# Patient Record
Sex: Female | Born: 1945
Health system: Southern US, Community
[De-identification: ages and names within clinical notes are randomized; demographics above are authoritative.]

## PROBLEM LIST (undated history)

## (undated) DIAGNOSIS — K219 Gastro-esophageal reflux disease without esophagitis: Secondary | ICD-10-CM

## (undated) DIAGNOSIS — T7840XA Allergy, unspecified, initial encounter: Secondary | ICD-10-CM

## (undated) DIAGNOSIS — K589 Irritable bowel syndrome without diarrhea: Secondary | ICD-10-CM

## (undated) DIAGNOSIS — N289 Disorder of kidney and ureter, unspecified: Secondary | ICD-10-CM

## (undated) DIAGNOSIS — F32A Depression, unspecified: Secondary | ICD-10-CM

## (undated) DIAGNOSIS — I10 Essential (primary) hypertension: Secondary | ICD-10-CM

## (undated) DIAGNOSIS — R51 Headache: Secondary | ICD-10-CM

## (undated) DIAGNOSIS — M797 Fibromyalgia: Secondary | ICD-10-CM

## (undated) DIAGNOSIS — C801 Malignant (primary) neoplasm, unspecified: Secondary | ICD-10-CM

## (undated) DIAGNOSIS — F419 Anxiety disorder, unspecified: Secondary | ICD-10-CM

## (undated) DIAGNOSIS — F329 Major depressive disorder, single episode, unspecified: Secondary | ICD-10-CM

## (undated) DIAGNOSIS — H269 Unspecified cataract: Secondary | ICD-10-CM

## (undated) DIAGNOSIS — M1990 Unspecified osteoarthritis, unspecified site: Secondary | ICD-10-CM

## (undated) DIAGNOSIS — M810 Age-related osteoporosis without current pathological fracture: Secondary | ICD-10-CM

## (undated) DIAGNOSIS — E559 Vitamin D deficiency, unspecified: Secondary | ICD-10-CM

## (undated) DIAGNOSIS — E785 Hyperlipidemia, unspecified: Secondary | ICD-10-CM

## (undated) DIAGNOSIS — K621 Rectal polyp: Secondary | ICD-10-CM

## (undated) DIAGNOSIS — K635 Polyp of colon: Secondary | ICD-10-CM

## (undated) DIAGNOSIS — G4730 Sleep apnea, unspecified: Secondary | ICD-10-CM

## (undated) DIAGNOSIS — R0602 Shortness of breath: Secondary | ICD-10-CM

## (undated) HISTORY — DX: Irritable bowel syndrome, unspecified: K58.9

## (undated) HISTORY — DX: Age-related osteoporosis without current pathological fracture: M81.0

## (undated) HISTORY — DX: Allergy, unspecified, initial encounter: T78.40XA

## (undated) HISTORY — PX: OTHER SURGICAL HISTORY: SHX169

## (undated) HISTORY — DX: Hyperlipidemia, unspecified: E78.5

## (undated) HISTORY — DX: Unspecified cataract: H26.9

## (undated) HISTORY — DX: Malignant (primary) neoplasm, unspecified: C80.1

## (undated) HISTORY — DX: Rectal polyp: K62.1

## (undated) HISTORY — DX: Unspecified osteoarthritis, unspecified site: M19.90

## (undated) HISTORY — DX: Polyp of colon: K63.5

## (undated) HISTORY — DX: Vitamin D deficiency, unspecified: E55.9

## (undated) HISTORY — DX: Gastro-esophageal reflux disease without esophagitis: K21.9

## (undated) HISTORY — PX: ABDOMINAL HYSTERECTOMY: SHX81

## (undated) HISTORY — PX: TONSILLECTOMY: SUR1361

## (undated) HISTORY — DX: Sleep apnea, unspecified: G47.30

## (undated) HISTORY — PX: APPENDECTOMY: SHX54

## (undated) HISTORY — PX: BREAST LUMPECTOMY: SHX2

## (undated) SURGERY — Surgical Case
Anesthesia: *Unknown

---

## 2000-10-12 ENCOUNTER — Emergency Department (HOSPITAL_COMMUNITY): Admission: EM | Admit: 2000-10-12 | Discharge: 2000-10-12 | Payer: Self-pay | Admitting: Emergency Medicine

## 2001-05-27 ENCOUNTER — Encounter: Payer: Self-pay | Admitting: Internal Medicine

## 2001-05-27 ENCOUNTER — Ambulatory Visit (HOSPITAL_COMMUNITY): Admission: RE | Admit: 2001-05-27 | Discharge: 2001-05-27 | Payer: Self-pay | Admitting: Internal Medicine

## 2002-10-06 ENCOUNTER — Encounter: Payer: Self-pay | Admitting: Internal Medicine

## 2002-10-06 ENCOUNTER — Ambulatory Visit (HOSPITAL_COMMUNITY): Admission: RE | Admit: 2002-10-06 | Discharge: 2002-10-06 | Payer: Self-pay | Admitting: Internal Medicine

## 2003-04-03 ENCOUNTER — Emergency Department (HOSPITAL_COMMUNITY): Admission: EM | Admit: 2003-04-03 | Discharge: 2003-04-03 | Payer: Self-pay | Admitting: Emergency Medicine

## 2003-09-22 ENCOUNTER — Ambulatory Visit (HOSPITAL_COMMUNITY): Admission: RE | Admit: 2003-09-22 | Discharge: 2003-09-22 | Payer: Self-pay | Admitting: Internal Medicine

## 2003-09-22 HISTORY — PX: COLONOSCOPY: SHX174

## 2004-03-10 ENCOUNTER — Ambulatory Visit (HOSPITAL_COMMUNITY): Admission: RE | Admit: 2004-03-10 | Discharge: 2004-03-10 | Payer: Self-pay | Admitting: Internal Medicine

## 2004-08-30 ENCOUNTER — Ambulatory Visit: Payer: Self-pay | Admitting: Orthopedic Surgery

## 2004-09-13 ENCOUNTER — Ambulatory Visit: Payer: Self-pay | Admitting: Orthopedic Surgery

## 2005-01-03 ENCOUNTER — Ambulatory Visit (HOSPITAL_COMMUNITY): Admission: RE | Admit: 2005-01-03 | Discharge: 2005-01-03 | Payer: Self-pay | Admitting: Internal Medicine

## 2005-01-09 ENCOUNTER — Ambulatory Visit (HOSPITAL_COMMUNITY): Admission: RE | Admit: 2005-01-09 | Discharge: 2005-01-09 | Payer: Self-pay | Admitting: Internal Medicine

## 2005-07-16 ENCOUNTER — Ambulatory Visit (HOSPITAL_COMMUNITY): Admission: RE | Admit: 2005-07-16 | Discharge: 2005-07-16 | Payer: Self-pay | Admitting: Internal Medicine

## 2005-12-03 ENCOUNTER — Ambulatory Visit (HOSPITAL_COMMUNITY): Admission: RE | Admit: 2005-12-03 | Discharge: 2005-12-03 | Payer: Self-pay | Admitting: Internal Medicine

## 2006-04-01 ENCOUNTER — Encounter
Admission: RE | Admit: 2006-04-01 | Discharge: 2006-06-30 | Payer: Self-pay | Admitting: Physical Medicine & Rehabilitation

## 2006-04-01 ENCOUNTER — Ambulatory Visit: Payer: Self-pay | Admitting: Physical Medicine & Rehabilitation

## 2006-05-28 ENCOUNTER — Ambulatory Visit: Payer: Self-pay | Admitting: Physical Medicine & Rehabilitation

## 2006-05-28 ENCOUNTER — Encounter
Admission: RE | Admit: 2006-05-28 | Discharge: 2006-08-26 | Payer: Self-pay | Admitting: Physical Medicine & Rehabilitation

## 2006-07-23 ENCOUNTER — Ambulatory Visit: Payer: Self-pay | Admitting: Physical Medicine & Rehabilitation

## 2006-10-08 ENCOUNTER — Ambulatory Visit (HOSPITAL_COMMUNITY): Admission: RE | Admit: 2006-10-08 | Discharge: 2006-10-08 | Payer: Self-pay | Admitting: Internal Medicine

## 2006-10-10 ENCOUNTER — Ambulatory Visit (HOSPITAL_COMMUNITY): Admission: RE | Admit: 2006-10-10 | Discharge: 2006-10-10 | Payer: Self-pay | Admitting: Internal Medicine

## 2006-11-12 ENCOUNTER — Ambulatory Visit: Payer: Self-pay | Admitting: Internal Medicine

## 2006-11-19 ENCOUNTER — Ambulatory Visit (HOSPITAL_COMMUNITY): Admission: RE | Admit: 2006-11-19 | Discharge: 2006-11-19 | Payer: Self-pay | Admitting: Internal Medicine

## 2006-11-19 ENCOUNTER — Ambulatory Visit: Payer: Self-pay | Admitting: Internal Medicine

## 2006-11-19 HISTORY — PX: ESOPHAGOGASTRODUODENOSCOPY: SHX1529

## 2006-12-10 ENCOUNTER — Ambulatory Visit (HOSPITAL_COMMUNITY): Admission: RE | Admit: 2006-12-10 | Discharge: 2006-12-10 | Payer: Self-pay | Admitting: Internal Medicine

## 2007-10-10 ENCOUNTER — Ambulatory Visit (HOSPITAL_COMMUNITY): Admission: RE | Admit: 2007-10-10 | Discharge: 2007-10-10 | Payer: Self-pay | Admitting: Internal Medicine

## 2008-12-03 ENCOUNTER — Ambulatory Visit (HOSPITAL_COMMUNITY): Admission: RE | Admit: 2008-12-03 | Discharge: 2008-12-03 | Payer: Self-pay | Admitting: Internal Medicine

## 2009-04-01 ENCOUNTER — Ambulatory Visit: Admission: RE | Admit: 2009-04-01 | Discharge: 2009-04-01 | Payer: Self-pay | Admitting: Internal Medicine

## 2009-08-17 ENCOUNTER — Ambulatory Visit (HOSPITAL_BASED_OUTPATIENT_CLINIC_OR_DEPARTMENT_OTHER): Admission: RE | Admit: 2009-08-17 | Discharge: 2009-08-17 | Payer: Self-pay | Admitting: Neurology

## 2009-11-21 ENCOUNTER — Ambulatory Visit (HOSPITAL_COMMUNITY): Admission: RE | Admit: 2009-11-21 | Discharge: 2009-11-21 | Payer: Self-pay | Admitting: Nephrology

## 2009-12-05 ENCOUNTER — Ambulatory Visit (HOSPITAL_COMMUNITY): Admission: RE | Admit: 2009-12-05 | Discharge: 2009-12-05 | Payer: Self-pay | Admitting: Internal Medicine

## 2010-09-19 NOTE — Consult Note (Signed)
NAME:  Tiffany Ortiz, Tiffany Ortiz            ACCOUNT NO.:  [id number]   MEDICAL RECORD NO.:  [id number]          PATIENT TYPE:  AMB   LOCATION:  DAY                           FACILITY:  [facility]   PHYSICIAN:  R. Roetta Sessions, M.D. DATE OF BIRTH:  02-23-1946   DATE OF CONSULTATION:  11/12/2006  DATE OF DISCHARGE:                                 CONSULTATION   REFERRING PHYSICIAN:  Kingsley Callander. Ouida Sills, MD   REASON FOR CONSULTATION:  Dysphagia.   HISTORY OF PRESENT ILLNESS:  Tiffany Ortiz is a 65 year old female with a  longstanding history of a intermittent dysphagia.  She tells me she has  problems with both solids and liquids.  She feels as though her food  gets stuck in her mid esophagus. She tries to clear as a bolus with  water. Occasionally she will have regurgitation of the water when she  tries to do this.  She does take Prilosec on an as-needed basis for  heartburn and indigestion.  She does have symptoms quite frequently, a  couple of times a week. She complains of anorexia, although her weight  has remained stable.  She denies any early satiety.  She usually has a  sore throat as well.  She takes occasional Goody powder and denies any  aspirin or NSAID use otherwise. She was actually evaluated in 2004 by  Tana Coast, PA-C, with similar symptoms. It was suggested she have an  EGD with possible esophageal dilatation at that time.  However, she  never followed up with this.  She has tried Protonix,  Aciphex, and  Prilosec p.r.n.  She felt Prilosec worked the best of these three  medications.  However, she is using it on just a p.r.n. basis.   PAST MEDICAL AND SURGICAL HISTORY:  1. Fibromyalgia.  2. Arthritis.  3. Hypertension.  4. Anxiety.  5. History of irritable bowel syndrome.  6. History of cervical cancer at age 70 status post partial      hysterectomy followed by complete  oophorectomy in 1990 due to      ovarian cyst.  7. She had has had a benign breast biopsy.   CURRENT  MEDICATIONS:  1. Calan SR 240 40 mg daily.  2. Glucosamine chondroitin once daily.  3. Benicar 20 mg daily.  4. Torsemide 20 mg p.r.n.  5. Cymbalta 60 mg daily,  6. Xanax 0.5 mg one to two daily p.r.n.  7. Provigil 200 mg daily.  8. Hydrocodone 5/500 mg  q.4 h p.r.n.  9. DHEA 50 mg daily nightly.  10.Omega-3 one to three capsules daily,  11.Calcium and vitamin D 600 mg daily.  12.Senokot p.r.n.   ALLERGIES:  1. SHE HAS A SEVERE SENSITIVITY TO METAL ALLOYS AND DESCRIBES POST      SURGICAL PROBLEMS WERE SHE HAS HAD EXPOSURE TO METAL.  She tells me      if she needed surgery, she would require sutures rather than      staples.  2. ERYTHROMYCIN.  3. PHENERGAN.  4. OXYCODONE.   FAMILY HISTORY:  There is no known family history colorectal carcinoma  or chronic GI  problems.   SOCIAL HISTORY:  Tiffany Ortiz is married.  She has two grown healthy  children.  She is on disability with history of fibromyalgia.  She  denies any tobacco, alcohol or drug use.   REVIEW OF SYSTEMS:  See HPI.  GI: She has chronic constipation which is  worsened by her narcotic use.  She is using stool softeners with good  relief at this time.  She denies any rectal bleeding or melena.   PHYSICAL EXAMINATION:  VITAL SIGNS:  Weight 167 pounds.  Height 63-1/2  inches.  Temperature 98.5, blood pressure 140/90, and pulse 88.  GENERAL:  Tiffany Ortiz is a well-developed, well-nourished elderly African-  American female in no acute distress.  HEENT:  Clear, nonicteric.  Conjunctivae pink.  Oropharynx pink and  moist without lesions.  NECK:  Supple without thyromegaly.  HEART:  Regular rate and rhythm, normal S1, S2 without murmurs, clicks,  rubs or gallops.  LUNGS:  Clear to auscultation bilaterally.  ABDOMEN:  Positive bowel sounds x4.  No bruits auscultated.  Soft,  nontender, nondistended, without palpable mass or splenomegaly.  No  rebound, tenderness or guarding.  EXTREMITIES:  Without clubbing or edema  bilaterally.  SKIN:  Warm and dry without any rash or jaundice.   IMPRESSION:  Tiffany Ortiz is a 65 year old female with greater than 4-year  history of intermittent dysphagia with both liquids and solids.  She  also describes frequent gastroesophageal reflux disease symptoms several  times a week. She has not been on a proton pump inhibitor routinely;  however, she has tried Protonix and Aciphex and p.r.n. Prilosec with  good relief of her symptoms.  She may benefit from long-term proton pump  inhibitor use.  I suspect she may have esophageal web, ring,  or  stricture, and she is going to need further evaluation at this time.   PLAN:  1. Possible EGD with Dr. Jena Gauss in the near future.  I have  discussed      the procedure including risks and benefits to include but not      limited to infection, perforation, drug reaction.  She agrees, and      consent will be obtained.  2. She is due for colonoscopy in 2015.  3. She can use Pepcid AC or Zantac on a p.r.n. basis prior to EGD.   I would like to thank Dr. Ouida Sills for allowing [place] to participate in the  care of  Tiffany Ortiz.      Lorenza Burton, N.P.      Jonathon Bellows, M.D.  Electronically Signed    KJ/MEDQ  D:  11/12/2006  T:  11/12/2006  Job:  [id number]   cc:   Kingsley Callander. Ouida Sills, MD  Fax: [phone]

## 2010-09-19 NOTE — Op Note (Signed)
NAME:  Tiffany Ortiz, Tiffany Ortiz            ACCOUNT NO.:  [id number]   MEDICAL RECORD NO.:  [id number]          PATIENT TYPE:  AMB   LOCATION:  DAY                           FACILITY:  [facility]   PHYSICIAN:  R. Roetta Sessions, M.D. DATE OF BIRTH:  February 06, 1946   DATE OF PROCEDURE:  11/19/2006  DATE OF DISCHARGE:                                PROCEDURE NOTE   PROCEDURE:  1. Esophagogastroduodenoscopy  2. Maloney dilation.   ENDOSCOPIST:  Jonathon Bellows, M.D.   INDICATIONS FOR PROCEDURE:  A 65 year old African American married  female with intermittent esophageal dysphagia to solids and sometimes  liquids.  EGD is now being done.  Potential for esophageal dilation is  reviewed.  Potential risks, benefits and alternatives have been  discussed and her questions were answered.  She is agreeable.  Please  see documentation in the medical record.   PROCEDURE NOTE:  O2 saturation, blood pressure, pulse and respirations  were monitor throughout the entire procedure.   CONSCIOUS SEDATION:  Versed 4 mg IV, Demerol 75 mg IV in divided doses.   INSTRUMENT:  Pentax video chip system.   FINDINGS:  Examination of the tubular esophagus revealed a prominent  Schatzki's ring with overlying distal esophageal erosions.  There was no  evidence of tumor.  The EG junction was traversed with the scope without  any difficulty.   STOMACH:  Gastric cavity was empty and insufflated well with air.  Thorough examination of the gastric mucosa including a retroflex view of  the proximal stomach and esophagogastric demonstrated only a small  hiatal hernia.  Pylorus was patent and easily traversed.  Examination of  the bulb and second portion revealed no abnormalities.  Therapeutic/diagnostic maneuvers were performed.  Scope was withdrawn  and a 56-French Maloney dilator was passed to full insertion.  A look  back revealed the ring had been ruptured as well as a 3-cm superficial  tear in the distal esophagus just above  the ring.  The patient tolerated  the procedure well and was reactive after endoscopy.   IMPRESSION:  1. Prominent Schatzki's ring with overlying distal esophageal erosions      consistent with erosive reflux esophagitis, status post dilation      and disruption of ring as described above.  2. Small hiatal hernia.  3. Otherwise normal stomach, first duodenum and second duodenum.   RECOMMENDATIONS:  1. Clear liquid diet today, but advance diet as tolerated tomorrow.  2. Take Prilosec 20 mg orally every day.  3. Ms. Beam is to call if she has any future difficulties with      swallowing.      Jonathon Bellows, M.D.  Electronically Signed     RMR/MEDQ  D:  11/19/2006  T:  11/20/2006  Job:  [id number]   cc:   Kingsley Callander. Ouida Sills, MD  Fax: [phone]

## 2010-09-22 NOTE — Assessment & Plan Note (Signed)
DATE OF OFFICE VISIT:  Wednesday, July 24, 2006.   Tiffany Ortiz is back regarding her fibromyalgia.  She has done well on  Provigil but has come off the medication when she ran out and did not  call for refills.  She has been sticking with her exercise program and  occasionally has tenderness on the bad days, but in general has been  doing better with this.  She likes the DHEA and on 25 mg of b.i.d.  She  does notice some hair growth on her lips which she is managing with wax,  etc.  She has been happy enough with the DHEA that she tolerates the  hair issues.  She continues on Cymbalta 60 mg a day for mood.  She  complains of itching possibly related to her Ultram.  We tried some  Benadryl with plus/minus results. She complains of runny nose and sore  throat as well.  It may be allergy related.  She has had some lesions on  her gluteal area on the right.  Apparently they have been identified in  the past and she has used Valtrex.   REVIEW OF SYSTEMS:  Positive for spasm, depression, skin rash and  breakdown, respiratory symptoms, shortness of breath.  Full review is in  the written health and history section.  Other pertinent positives as  above.   SOCIAL HISTORY:  The patient is married and husband is having some  health issues of his own.   PHYSICAL EXAMINATION:  VITAL SIGNS:  Blood pressure 126/58, pulse 89,  respiratory rate 16.  Saturation 98% on room air.  GENERAL:  Patient is pleasant in no acute distress.  Alert and oriented  x3.  NEUROMUSCULAR:  Gait is normal.  She complains of some generalized  tenderness with bending.  It was less consistent, however, down the  right leg today.  She has hamstring tightness with bending.  There is  also some tautness in the lower lumbar paraspinous musculature and  anterior scapular musculature today.  I examined the right gluteal area  and there was herpetic-appearing sore that was healing over the right  medial gluteus.  No drainage or  open wound was appreciated.  It  generally was scabbed over.  Reflexes are 2+.  Sensation is normal on  both legs.  Cognitive issues generally intact with good insight,  awareness and memory.  HEART:  Regular rate.  CHEST:  Clear.  ABDOMEN:  Soft, nontender.  The patient remains obese.   ASSESSMENT:  1. Fibromyalgia syndrome.  2. Lumbar spondylosis.  3. Anxiety/depression.  4. Myofascial pain in the trunk, possibly the gluteal area and the      interscapular regions.  5. Herpetic sores in the gluteal area.   PLAN:  1. DHEA 25 mg twice a day.  2. Encourage use of Provigil 200 mg daily. She will start back on half      tablet and then increase to 200 mg after one week or so.  3. Maintain Cymbalta 60 mg daily.  4. Encourage exercise, range of motion and core muscle strengthening.      She would do well with a fibromyalgia based aquatic program.  5. Recommended Claritin for respiratory/allergy symptoms.  6. Ultram for breakthrough pain.  7. Valtrex 500 mg q.12h. for seven days for herpetic lesions.  8. Will see the patient back in about two to three months.      Ranelle Oyster, M.D.  Electronically Signed     ZTS/MedQ  D:  07/24/2006 12:57:01  T:  07/24/2006 15:26:10  Job #:  [id number]   cc:   Kingsley Callander. Ouida Sills, MD  Fax: [phone]

## 2010-09-22 NOTE — Assessment & Plan Note (Signed)
Tiffany Ortiz is back regarding her fibromyalgia.  She liked the Provigil  for energy.  She is using only 100 mg daily as the 200 mg is a bit too  strong.  She did not have her MRI done due to financial concerns.  She  states that she has significant tightness in the neck going down the  shoulders into the low back.  She has pain radiating into the right leg  particularly with symptoms of numbness, tingling into the foot.  The  pain is worse with activity, particularly walking, bending, sitting and  inactivity for long periods.  She also has pain with standing.  She  rates her pain as 7-9/10.  Sleep can be poor at times.  She tries to do  some walking.  She can go for about 30 minutes at a time with walking or  gentle exercise.   We had her labs performed on January 11.  She has a mild electrolytes  abnormalities.  Her triglycerides were high at 309, which apparently was  a decrease from a prior check.  Overall cholesterol was 200.  Magnesium,  B12, folate, progesterone, estrogen, testosterone were all within normal  range.  Her DHEA was low at 66.   REVIEW OF SYSTEMS:  The patient reports multiple issues including  numbness, tremor, tingling, spasms, depression, anxiety, night sweats,  diarrhea, constipation, abdominal pain.  She did stop her Celebrex due  to sweating and it seems to have helped.   SOCIAL HISTORY:  The patient is married, living with her husband who has  had some health issues of his own.   PHYSICAL EXAMINATION:  VITAL SIGNS:  Blood pressure 140/71, pulse 88,  respiratory rate 16, satting 97% on room air.  GENERAL:  The patient is pleasant.  No acute distress.  She is alert and  oriented x3.  Affect generally bright and appropriate. Gait is slightly  wide-based.  She continues to have some tenderness in the lumbar spine  from high to low.  The pain seemed to be worse with bending at the  waist.  She has some tenderness in the intrascapular area as well.  Muscle  strength was 5/5.  She had 2+ reflexes and intact sensation in  both legs.  Multiple areas of tenderness were appreciated over the legs,  arms, chest, etc.  Weight was generally stable but she is obese.  HEART:  Regular rate.  CHEST:  Clear.  ABDOMEN:  Soft, nontender.  COGNITIVELY:  She is intact.   ASSESSMENT:  1. Fibromyalgia syndrome.  2. Lumbar spondylosis with possible radiculopathy in the right.  3. Anxiety/depression.  4. Myofascial pain and truncal spasms due to poor posture and other      factors.   PLAN:  1. Begin trial Flexeril 5 mg q. 8 hours p.r.n.  2. Work on low back strengthening and exercises at home.  I would like      to see her increase her aerobic activity as well for both her      fibromyalgia symptoms and her triglycerides.  3. Maintain Provigil at 100 mg daily.  4. Begin trial DHEA 25 mg daily for 1 week and up to b.i.d.  5. Continue Cymbalta 60 mg daily.  6. I would like to see her get an MRI performed at some point for her      low back and leg symptoms.  7. Refilled Ultram 50 mg q. 8 hours p.r.n., #60.  8. I will see the patient back in  about 2 month's time.      Ranelle Oyster, M.D.  Electronically Signed     ZTS/MedQ  D:  05/29/2006 12:25:31  T:  05/29/2006 13:28:37  Job #:  [id number]   cc:   Kingsley Callander. Ouida Sills, MD  Fax: [phone]

## 2010-09-22 NOTE — Op Note (Signed)
NAME:  Tiffany Ortiz, Tiffany Ortiz                      ACCOUNT NO.:  [id number]   MEDICAL RECORD NO.:  [id number]                   PATIENT TYPE:  AMB   LOCATION:  DAY                                  FACILITY:  [facility]   PHYSICIAN:  R. Roetta Sessions, M.D.              DATE OF BIRTH:  [birth year]   DATE OF PROCEDURE:  09/22/2003  DATE OF DISCHARGE:                                 OPERATIVE REPORT   PROCEDURE:  Colonoscopy with biopsy.   INDICATIONS FOR PROCEDURE:  The patient is a 65 year old lady essentially  devoid of any lower GI tract symptoms, aside from a slight amount of blood  per rectum during the prep period yesterday.  Referred for screening  colonoscopy.  She reportedly has a history of hyperplastic polyps removed by  Dr. Katrinka Blazing during a colonoscopy years ago.  There is no family history of  colorectal neoplasia.  Colonoscopy is now being done.  This approach has been discussed with the patient at length.  The potential  risks, benefits, and alternatives have been reviewed and questions answered.  She is agreeable.  Please see my handwritten H&P for more information.   PROCEDURE:  O2 saturation, blood pressure, pulses, and respirations were  monitored throughout the entirety of the procedure.  Conscious sedation was  with Versed 3 mg IV, Demerol 75 mg IV in divided doses.  The instrument used  was the Olympus video chip system.   FINDINGS:  Digital rectal examination revealed no abnormalities.   ENDOSCOPIC FINDINGS:  The prep was adequate.   Rectum:  Examination of the rectal mucosa including retroflex view of the  anal verge revealed four 1 to 2-mm diminutive polyps.  Otherwise, the rectal  mucosa appeared normal.   Colon:  The colonic mucosa was surveyed from the rectosigmoid junction  through the left, transverse, right colon to the area of the appendiceal  orifice, ileocecal valve, and cecum.  These structures were well-seen and  photographed for the record.  From this  level, the scope was slowly  withdrawn.  All previously mentioned mucosal surfaces were again seen.  The  patient was noted to have pancolonic diverticula.  The remainder of the  colonic mucosa appeared normal.  The diminutive polyps in the rectum were  cold biopsy/removed.  The patient tolerated the procedure well and was  reactive in endoscopy.   IMPRESSION:  1. Diminutive rectal polyps cold biopsy/removed.  Otherwise normal rectal     mucosa.  2. Pancolonic diverticula.  The remainder of the colonic mucosa appeared     normal.   RECOMMENDATIONS:  1. Diverticulosis literature provided to [name].  2. Follow up on pathology.  3. Further recommendations to follow.      ___________________________________________  Jonathon Bellows, M.D.   RMR/MEDQ  D:  09/22/2003  T:  09/22/2003  Job:  [id number]   cc:   Kingsley Callander. Ouida Sills, M.D.  [address]  [place]  [place] [address]  Fax: [phone]

## 2010-09-22 NOTE — Assessment & Plan Note (Signed)
Tiffany Ortiz is back regarding her fibromyalgia syndrome.  She was unable  to tolerate the increase in Lyrica, and actually went back to her old  dose, which still is making her feel tired and sleepy, with superimposed  anxiety and increased appetite.  She had no problems changing to  Cymbalta, and thinks it may have been helpful somewhat for mood.  She  has not had her fibromyalgia labs performed yet.  She complains of pain  diffusely, but more over the legs, especially on the right from her hip  down into the foot.  She describes the pain as burning, tingling,  aching, constant.  Pain is, on average, a 7-8 out of 10.  Pain  interferes with general activity, relations with others, and enjoyment  of life on a moderate level.   REVIEW OF SYSTEMS:  Positive weakness, numbness, tingling, trouble  walking, spasms, confusion, depression, anxiety, skin rash in the past,  fever, chills, night sweats, diarrhea, constipation, limb swelling,  shortness of breath.   SOCIAL HISTORY:  Negative for any new changes.  She is happily married  to her husband.   PHYSICAL EXAMINATION:  Blood pressure is 151/84, pulse is 93,  respiratory rate is 16.  She is satting 98% on room air.  Patient is pleasant, in no acute distress.  She is alert and oriented  times 3.  Affect is bright and appropriate.  Weight is unchanged.  GAIT:  A slightly wide base, but generally stable.  HEART:  Regular rate and rhythm.  LUNGS:  Clear.  ABDOMEN:  Soft, nontender.  Both legs are somewhat sensitive to touch.  She had a positive seated  slump test and straight leg test in the right leg today.  Reflexes were  2+, however, in all 4 tested areas in lower extremities.  Sensory exam  was grossly intact.  She has significant hallux valgus deformities in  both feet, right more than left, with pressure areas at the MTP.  Multiple tender points are appreciated throughout the legs, chest, back,  neck, etc., today.  Cognitively, she was  generally appropriate with good insight and  awareness.  Cranial nerve exam is intact.   ASSESSMENT:  1. Fibromyalgia syndrome.  2. Lumbar spondylosis with questionable radiculopathy.  3. Anxiety and depression.   PLAN:  1. Stop Lyrica.  2. Begin a trial of Provigil 200 mg daily.  3. Cymbalta 60 mg daily for now.  Consider increasing this in the      future.  4. Patient needs to complete blood work.  5. Have an MRI performed of the lumbar spine and rule out      radiculopathy.  6. Begin trial of Ultram for breakthrough pain 50 mg q.8h. p.r.n.  7. Celebrex 200 mg daily for anti-inflammatory effects.  Samples were      given today.  8. I will see the patient back in about 1 month's time.      Ranelle Oyster, M.D.  Electronically Signed     ZTS/MedQ  D:  04/26/2006 10:42:15  T:  04/26/2006 19:19:37  Job #:  [id number]   cc:   Kingsley Callander. Ouida Sills, MD  Fax: [phone]

## 2011-01-30 ENCOUNTER — Other Ambulatory Visit: Payer: Self-pay

## 2011-01-30 ENCOUNTER — Emergency Department (HOSPITAL_COMMUNITY)
Admission: EM | Admit: 2011-01-30 | Discharge: 2011-01-30 | Disposition: A | Discharge status: Home / Self Care | Payer: Medicare Other | Attending: Emergency Medicine | Admitting: Emergency Medicine

## 2011-01-30 DIAGNOSIS — E876 Hypokalemia: Secondary | ICD-10-CM | POA: Insufficient documentation

## 2011-01-30 DIAGNOSIS — E119 Type 2 diabetes mellitus without complications: Secondary | ICD-10-CM | POA: Insufficient documentation

## 2011-01-30 DIAGNOSIS — I4949 Other premature depolarization: Secondary | ICD-10-CM | POA: Insufficient documentation

## 2011-01-30 DIAGNOSIS — R109 Unspecified abdominal pain: Secondary | ICD-10-CM | POA: Insufficient documentation

## 2011-01-30 DIAGNOSIS — I498 Other specified cardiac arrhythmias: Secondary | ICD-10-CM | POA: Insufficient documentation

## 2011-01-30 DIAGNOSIS — I10 Essential (primary) hypertension: Secondary | ICD-10-CM | POA: Insufficient documentation

## 2011-01-30 DIAGNOSIS — IMO0001 Reserved for inherently not codable concepts without codable children: Secondary | ICD-10-CM | POA: Insufficient documentation

## 2011-01-30 HISTORY — DX: Essential (primary) hypertension: I10

## 2011-01-30 HISTORY — DX: Fibromyalgia: M79.7

## 2011-01-30 HISTORY — DX: Disorder of kidney and ureter, unspecified: N28.9

## 2011-01-30 LAB — CBC
Hemoglobin: 14.3 g/dL (ref 12.0–15.0)
MCH: 30.6 pg (ref 26.0–34.0)
RBC: 4.67 MIL/uL (ref 3.87–5.11)

## 2011-01-30 LAB — BASIC METABOLIC PANEL
BUN: 29 mg/dL — ABNORMAL HIGH (ref 6–23)
GFR calc Af Amer: 57 mL/min — ABNORMAL LOW (ref >60)
GFR calc non Af Amer: 47 mL/min — ABNORMAL LOW (ref >60)
Glucose, Bld: 137 mg/dL — ABNORMAL HIGH (ref 70–99)
Potassium: 3.1 mEq/L — ABNORMAL LOW (ref 3.5–5.1)

## 2011-01-30 NOTE — ED Notes (Signed)
Pt sent from PCP after being called with abnormal lab work. Potassium was 7.2. Pt denies sob or cp.

## 2011-01-30 NOTE — ED Provider Notes (Signed)
History     CSN: [id number] Arrival date & time: No admission date for patient encounter.  Chief Complaint  Patient presents with  . Abnormal Lab    HPI  (Consider location/radiation/quality/duration/timing/severity/associated sxs/prior treatment)  The history is provided by the patient and a relative.  SENT FROM ENDOCRINOLOGIST OFFICE FOR REPORT OF POTASSIUM OF 7 FROM BLOOD DRAW. PATIENT WITHOUT SYMPTOMS. NO HX OF RENAL FAILURE NOT ON DIALYSIS. HX OF DIABETES.   Past Medical History  Diagnosis Date  . Fibromyalgia   . Diabetes mellitus   . Renal disorder   . Hypertension     Past Surgical History  Procedure Date  . Abdominal hysterectomy     No family history on file.  History  Substance Use Topics  . Smoking status: Never Smoker   . Smokeless tobacco: Not on file  . Alcohol Use: No    OB History    Grav Para Term Preterm Abortions TAB SAB Ect Mult Living                  Review of Systems  Review of Systems  Constitutional: Negative for fever and fatigue.  HENT: Negative for congestion and neck pain.   Eyes: Negative for visual disturbance.  Respiratory: Negative for cough, chest tightness and shortness of breath.   Cardiovascular: Negative for chest pain and palpitations.  Gastrointestinal: Positive for abdominal pain. Negative for nausea, vomiting and diarrhea.  Genitourinary: Negative for dysuria.  Musculoskeletal: Negative for back pain.  Skin: Negative for rash.  Neurological: Negative for weakness and headaches.  Psychiatric/Behavioral: Negative for confusion.    Allergies  Promethazine  Home Medications  No current outpatient prescriptions on file.  Physical Exam    BP 142/68  Pulse 84  Temp(Src) 99.1 F (37.3 C) (Oral)  Resp 20  Ht 5\' 4"  (1.626 m)  Wt 169 lb (76.658 kg)  BMI 29.01 kg/m2  SpO2 98%  Physical Exam  Nursing note and vitals reviewed. Constitutional: She is oriented to person, place, and time. She appears  well-developed and well-nourished. No distress.  HENT:  Head: Normocephalic and atraumatic.  Mouth/Throat: Oropharynx is clear and moist.  Eyes: Conjunctivae and EOM are normal. Pupils are equal, round, and reactive to light.  Neck: Normal range of motion. Neck supple.  Cardiovascular: Normal rate, regular rhythm and normal heart sounds.   No murmur heard. Pulmonary/Chest: Effort normal and breath sounds normal.  Abdominal: Soft. Bowel sounds are normal. There is no tenderness.  Musculoskeletal: Normal range of motion.  Neurological: She is alert and oriented to person, place, and time. No cranial nerve deficit. She exhibits normal muscle tone. Coordination normal.  Skin: Skin is warm. No rash noted.    ED Course  Procedures (including critical care time)  Labs Reviewed - No data to display   Date: 01/30/2011  Rate: 80  Rhythm: normal sinus rhythm, sinus arrhythmia and premature ventricular contractions (PVC)  QRS Axis: normal  Intervals: normal  ST/T Wave abnormalities: normal and nonspecific T wave changes  Conduction Disutrbances:none  Narrative Interpretation:   Old EKG Reviewed: none available NOT CW HYPERKALEMIA    LABS IN ED ACUTALLY DEPICT MILD HYPOKALEMIA NOT HYPERKALEMIA.   MDM MOST LIKELY LAB FROM DOCTORS OFFICE HEMOLYZIED.   FOLLOW UP AS NEEDED.         Shelda Jakes, MD 01/30/11 [phone]

## 2011-06-06 DIAGNOSIS — E785 Hyperlipidemia, unspecified: Secondary | ICD-10-CM | POA: Diagnosis not present

## 2011-06-06 DIAGNOSIS — I10 Essential (primary) hypertension: Secondary | ICD-10-CM | POA: Diagnosis not present

## 2011-06-06 DIAGNOSIS — E119 Type 2 diabetes mellitus without complications: Secondary | ICD-10-CM | POA: Diagnosis not present

## 2011-06-06 DIAGNOSIS — R5383 Other fatigue: Secondary | ICD-10-CM | POA: Diagnosis not present

## 2011-07-25 DIAGNOSIS — J069 Acute upper respiratory infection, unspecified: Secondary | ICD-10-CM | POA: Diagnosis not present

## 2011-07-25 DIAGNOSIS — J392 Other diseases of pharynx: Secondary | ICD-10-CM | POA: Diagnosis not present

## 2011-07-25 DIAGNOSIS — J210 Acute bronchiolitis due to respiratory syncytial virus: Secondary | ICD-10-CM | POA: Diagnosis not present

## 2011-07-26 ENCOUNTER — Ambulatory Visit: Payer: Self-pay | Admitting: Obstetrics and Gynecology

## 2011-08-09 DIAGNOSIS — E119 Type 2 diabetes mellitus without complications: Secondary | ICD-10-CM | POA: Diagnosis not present

## 2011-08-09 DIAGNOSIS — E559 Vitamin D deficiency, unspecified: Secondary | ICD-10-CM | POA: Diagnosis not present

## 2011-08-09 DIAGNOSIS — I10 Essential (primary) hypertension: Secondary | ICD-10-CM | POA: Diagnosis not present

## 2011-08-09 DIAGNOSIS — D72829 Elevated white blood cell count, unspecified: Secondary | ICD-10-CM | POA: Diagnosis not present

## 2011-08-20 ENCOUNTER — Telehealth: Payer: Self-pay

## 2011-08-20 NOTE — Telephone Encounter (Signed)
Late entry for 08/18/2011. I called [facility] to refill pt's Zovirax ointment x1. 5 % 30 gm tube sig: Apply to affected area 6 x daily for seven days, as directed. Per protocol. JO CMA.

## 2011-09-05 DIAGNOSIS — E1142 Type 2 diabetes mellitus with diabetic polyneuropathy: Secondary | ICD-10-CM | POA: Diagnosis not present

## 2011-09-05 DIAGNOSIS — I10 Essential (primary) hypertension: Secondary | ICD-10-CM | POA: Diagnosis not present

## 2011-09-05 DIAGNOSIS — G47419 Narcolepsy without cataplexy: Secondary | ICD-10-CM | POA: Diagnosis not present

## 2011-09-05 DIAGNOSIS — IMO0001 Reserved for inherently not codable concepts without codable children: Secondary | ICD-10-CM | POA: Diagnosis not present

## 2011-10-16 DIAGNOSIS — E119 Type 2 diabetes mellitus without complications: Secondary | ICD-10-CM | POA: Diagnosis not present

## 2011-10-16 DIAGNOSIS — I10 Essential (primary) hypertension: Secondary | ICD-10-CM | POA: Diagnosis not present

## 2011-11-01 ENCOUNTER — Other Ambulatory Visit (HOSPITAL_COMMUNITY): Payer: Self-pay | Admitting: Internal Medicine

## 2011-11-01 DIAGNOSIS — Z139 Encounter for screening, unspecified: Secondary | ICD-10-CM

## 2011-11-05 ENCOUNTER — Ambulatory Visit (HOSPITAL_COMMUNITY)
Admission: RE | Admit: 2011-11-05 | Discharge: 2011-11-05 | Disposition: A | Discharge status: Home / Self Care | Payer: Medicare Other | Source: Ambulatory Visit | Attending: Internal Medicine | Admitting: Internal Medicine

## 2011-11-05 DIAGNOSIS — Z1231 Encounter for screening mammogram for malignant neoplasm of breast: Secondary | ICD-10-CM | POA: Diagnosis not present

## 2011-11-05 DIAGNOSIS — Z139 Encounter for screening, unspecified: Secondary | ICD-10-CM

## 2011-12-12 DIAGNOSIS — E669 Obesity, unspecified: Secondary | ICD-10-CM | POA: Diagnosis not present

## 2011-12-12 DIAGNOSIS — I10 Essential (primary) hypertension: Secondary | ICD-10-CM | POA: Diagnosis not present

## 2012-02-05 DIAGNOSIS — Z23 Encounter for immunization: Secondary | ICD-10-CM | POA: Diagnosis not present

## 2012-03-04 DIAGNOSIS — IMO0001 Reserved for inherently not codable concepts without codable children: Secondary | ICD-10-CM | POA: Diagnosis not present

## 2012-03-04 DIAGNOSIS — E119 Type 2 diabetes mellitus without complications: Secondary | ICD-10-CM | POA: Diagnosis not present

## 2012-03-04 DIAGNOSIS — E785 Hyperlipidemia, unspecified: Secondary | ICD-10-CM | POA: Diagnosis not present

## 2012-03-04 DIAGNOSIS — I10 Essential (primary) hypertension: Secondary | ICD-10-CM | POA: Diagnosis not present

## 2012-03-04 DIAGNOSIS — K219 Gastro-esophageal reflux disease without esophagitis: Secondary | ICD-10-CM | POA: Diagnosis not present

## 2012-08-18 DIAGNOSIS — E559 Vitamin D deficiency, unspecified: Secondary | ICD-10-CM | POA: Diagnosis not present

## 2012-08-18 DIAGNOSIS — N189 Chronic kidney disease, unspecified: Secondary | ICD-10-CM | POA: Diagnosis not present

## 2012-08-18 DIAGNOSIS — D649 Anemia, unspecified: Secondary | ICD-10-CM | POA: Diagnosis not present

## 2012-08-18 DIAGNOSIS — Z79899 Other long term (current) drug therapy: Secondary | ICD-10-CM | POA: Diagnosis not present

## 2012-08-18 DIAGNOSIS — I10 Essential (primary) hypertension: Secondary | ICD-10-CM | POA: Diagnosis not present

## 2012-08-18 DIAGNOSIS — R809 Proteinuria, unspecified: Secondary | ICD-10-CM | POA: Diagnosis not present

## 2012-08-25 DIAGNOSIS — M5430 Sciatica, unspecified side: Secondary | ICD-10-CM | POA: Diagnosis not present

## 2012-08-25 DIAGNOSIS — R109 Unspecified abdominal pain: Secondary | ICD-10-CM | POA: Diagnosis not present

## 2012-08-25 DIAGNOSIS — E119 Type 2 diabetes mellitus without complications: Secondary | ICD-10-CM | POA: Diagnosis not present

## 2012-08-29 DIAGNOSIS — D72829 Elevated white blood cell count, unspecified: Secondary | ICD-10-CM | POA: Diagnosis not present

## 2012-08-29 DIAGNOSIS — E119 Type 2 diabetes mellitus without complications: Secondary | ICD-10-CM | POA: Diagnosis not present

## 2012-08-29 DIAGNOSIS — E782 Mixed hyperlipidemia: Secondary | ICD-10-CM | POA: Diagnosis not present

## 2012-08-29 DIAGNOSIS — I10 Essential (primary) hypertension: Secondary | ICD-10-CM | POA: Diagnosis not present

## 2012-09-03 DIAGNOSIS — I10 Essential (primary) hypertension: Secondary | ICD-10-CM | POA: Diagnosis not present

## 2012-09-03 DIAGNOSIS — N182 Chronic kidney disease, stage 2 (mild): Secondary | ICD-10-CM | POA: Diagnosis not present

## 2012-09-03 DIAGNOSIS — E559 Vitamin D deficiency, unspecified: Secondary | ICD-10-CM | POA: Diagnosis not present

## 2012-09-23 DIAGNOSIS — IMO0001 Reserved for inherently not codable concepts without codable children: Secondary | ICD-10-CM | POA: Diagnosis not present

## 2012-09-23 DIAGNOSIS — R109 Unspecified abdominal pain: Secondary | ICD-10-CM | POA: Diagnosis not present

## 2012-09-23 DIAGNOSIS — F988 Other specified behavioral and emotional disorders with onset usually occurring in childhood and adolescence: Secondary | ICD-10-CM | POA: Diagnosis not present

## 2012-09-24 ENCOUNTER — Other Ambulatory Visit (HOSPITAL_COMMUNITY): Payer: Self-pay | Admitting: Internal Medicine

## 2012-09-24 DIAGNOSIS — R109 Unspecified abdominal pain: Secondary | ICD-10-CM

## 2012-09-25 ENCOUNTER — Other Ambulatory Visit: Payer: Self-pay

## 2012-09-25 DIAGNOSIS — G479 Sleep disorder, unspecified: Secondary | ICD-10-CM

## 2012-09-26 ENCOUNTER — Ambulatory Visit (HOSPITAL_COMMUNITY): Payer: Medicare Other

## 2012-09-30 ENCOUNTER — Ambulatory Visit (HOSPITAL_COMMUNITY)
Admission: RE | Admit: 2012-09-30 | Discharge: 2012-09-30 | Disposition: A | Discharge status: Home / Self Care | Payer: Medicare Other | Source: Ambulatory Visit | Attending: Internal Medicine | Admitting: Internal Medicine

## 2012-09-30 DIAGNOSIS — R197 Diarrhea, unspecified: Secondary | ICD-10-CM | POA: Diagnosis not present

## 2012-09-30 DIAGNOSIS — K5730 Diverticulosis of large intestine without perforation or abscess without bleeding: Secondary | ICD-10-CM | POA: Insufficient documentation

## 2012-09-30 DIAGNOSIS — K7689 Other specified diseases of liver: Secondary | ICD-10-CM | POA: Diagnosis not present

## 2012-09-30 DIAGNOSIS — R109 Unspecified abdominal pain: Secondary | ICD-10-CM | POA: Diagnosis not present

## 2012-09-30 DIAGNOSIS — R110 Nausea: Secondary | ICD-10-CM | POA: Insufficient documentation

## 2012-09-30 MED ORDER — IOHEXOL 300 MG/ML  SOLN
100.0000 mL | Freq: Once | INTRAMUSCULAR | Status: AC | PRN
  Administered 2012-09-30: 100 mL via INTRAVENOUS

## 2012-10-07 ENCOUNTER — Ambulatory Visit: Payer: Medicare Other | Attending: Internal Medicine | Admitting: Sleep Medicine

## 2012-10-07 DIAGNOSIS — Z6830 Body mass index (BMI) 30.0-30.9, adult: Secondary | ICD-10-CM | POA: Insufficient documentation

## 2012-10-07 DIAGNOSIS — G479 Sleep disorder, unspecified: Secondary | ICD-10-CM

## 2012-10-07 DIAGNOSIS — G4733 Obstructive sleep apnea (adult) (pediatric): Secondary | ICD-10-CM | POA: Diagnosis not present

## 2012-10-07 DIAGNOSIS — G4710 Hypersomnia, unspecified: Secondary | ICD-10-CM | POA: Diagnosis not present

## 2012-10-08 DIAGNOSIS — B029 Zoster without complications: Secondary | ICD-10-CM | POA: Diagnosis not present

## 2012-10-08 NOTE — Procedures (Signed)
[facility] Phelan Goers A. Gerilyn Pilgrim, MD     [email or web address]        NAMEFELIZA, Tiffany Ortiz            ACCOUNT NO.:  [id number]  MEDICAL RECORD NO.:  [id number]          PATIENT TYPE:  OUT  LOCATION:  SLEEP LAB                     FACILITY:  [facility]  PHYSICIAN:  Itati Brocksmith A. Gerilyn Pilgrim, M.D. DATE OF BIRTH:  1945-06-03  DATE OF STUDY:  10/07/2012                           NOCTURNAL POLYSOMNOGRAM  REFERRING PHYSICIAN:  Catalina Pizza, M.D.  INDICATIONS:  A 67 year old lady who presents with hypersomnia, fatigue, and snoring.  There is also history of witnessed apnea.  MEDICATIONS:  Folic acid, albuterol, Allegra, Nuvigil, Ultram, Valtrex, Pravachol, Zyrtec, losartan, gabapentin, Nexium, loperamide, vitamin D.  EPWORTH SLEEPINESS SCALE: 1.  BMI 30.  ARCHITECTURAL SUMMARY:  The total recording time is 412 minutes.  Sleep efficiency 79%.  Sleep latency 5 minutes.  REM latency 120 minutes. Stage N1 14%, N2 74%, N3 1%, and REM sleep 12%.  RESPIRATORY DATA:  Baseline oxygen saturation is 94, lowest saturation 82 during non-REM sleep.  Diagnostic AHI is 19 and RDI 20.  The events occurred more during REM sleep with the REM AHI being 40.  LIMB MOVEMENT SUMMARY:  PLM index 0.  ELECTROCARDIOGRAM SUMMARY:  Average heart rate is 70 with no significant dysrhythmias observed.  IMPRESSION:  Moderate obstructive sleep apnea syndrome.  RECOMMENDATION:  Formal CPAP titration recording.  Thanks for this referral.    Cheryle Dark A. Gerilyn Pilgrim, M.D.    KAD/MEDQ  D:  10/08/2012 09:06:05  T:  10/08/2012 09:24:10  Job:  [id number]

## 2012-10-29 DIAGNOSIS — B029 Zoster without complications: Secondary | ICD-10-CM | POA: Diagnosis not present

## 2012-10-29 DIAGNOSIS — F410 Panic disorder [episodic paroxysmal anxiety] without agoraphobia: Secondary | ICD-10-CM | POA: Diagnosis not present

## 2012-11-26 DIAGNOSIS — H02039 Senile entropion of unspecified eye, unspecified eyelid: Secondary | ICD-10-CM | POA: Diagnosis not present

## 2012-11-26 DIAGNOSIS — H2510 Age-related nuclear cataract, unspecified eye: Secondary | ICD-10-CM | POA: Diagnosis not present

## 2012-11-26 DIAGNOSIS — H04129 Dry eye syndrome of unspecified lacrimal gland: Secondary | ICD-10-CM | POA: Diagnosis not present

## 2012-11-26 DIAGNOSIS — E119 Type 2 diabetes mellitus without complications: Secondary | ICD-10-CM | POA: Diagnosis not present

## 2012-11-26 DIAGNOSIS — H02839 Dermatochalasis of unspecified eye, unspecified eyelid: Secondary | ICD-10-CM | POA: Diagnosis not present

## 2012-12-01 DIAGNOSIS — E119 Type 2 diabetes mellitus without complications: Secondary | ICD-10-CM | POA: Diagnosis not present

## 2012-12-01 DIAGNOSIS — E785 Hyperlipidemia, unspecified: Secondary | ICD-10-CM | POA: Diagnosis not present

## 2012-12-01 DIAGNOSIS — I10 Essential (primary) hypertension: Secondary | ICD-10-CM | POA: Diagnosis not present

## 2012-12-08 DIAGNOSIS — E119 Type 2 diabetes mellitus without complications: Secondary | ICD-10-CM | POA: Diagnosis not present

## 2012-12-08 DIAGNOSIS — I10 Essential (primary) hypertension: Secondary | ICD-10-CM | POA: Diagnosis not present

## 2012-12-08 DIAGNOSIS — E782 Mixed hyperlipidemia: Secondary | ICD-10-CM | POA: Diagnosis not present

## 2012-12-30 DIAGNOSIS — B029 Zoster without complications: Secondary | ICD-10-CM | POA: Diagnosis not present

## 2012-12-30 DIAGNOSIS — R197 Diarrhea, unspecified: Secondary | ICD-10-CM | POA: Diagnosis not present

## 2012-12-30 DIAGNOSIS — R109 Unspecified abdominal pain: Secondary | ICD-10-CM | POA: Diagnosis not present

## 2013-01-01 ENCOUNTER — Encounter: Payer: Self-pay | Admitting: Internal Medicine

## 2013-01-27 ENCOUNTER — Encounter: Payer: Self-pay | Admitting: Internal Medicine

## 2013-01-28 ENCOUNTER — Other Ambulatory Visit: Payer: Self-pay | Admitting: Internal Medicine

## 2013-01-28 ENCOUNTER — Ambulatory Visit (INDEPENDENT_AMBULATORY_CARE_PROVIDER_SITE_OTHER): Payer: Medicare Other | Admitting: Gastroenterology

## 2013-01-28 ENCOUNTER — Encounter: Payer: Self-pay | Admitting: Gastroenterology

## 2013-01-28 VITALS — BP 153/75 | HR 83 | Temp 97.9°F | Ht 61.0 in | Wt 171.2 lb

## 2013-01-28 DIAGNOSIS — K529 Noninfective gastroenteritis and colitis, unspecified: Secondary | ICD-10-CM

## 2013-01-28 DIAGNOSIS — R1314 Dysphagia, pharyngoesophageal phase: Secondary | ICD-10-CM

## 2013-01-28 DIAGNOSIS — R197 Diarrhea, unspecified: Secondary | ICD-10-CM | POA: Diagnosis not present

## 2013-01-28 DIAGNOSIS — K219 Gastro-esophageal reflux disease without esophagitis: Secondary | ICD-10-CM

## 2013-01-28 DIAGNOSIS — R1319 Other dysphagia: Secondary | ICD-10-CM

## 2013-01-28 DIAGNOSIS — R1310 Dysphagia, unspecified: Secondary | ICD-10-CM

## 2013-01-28 MED ORDER — PEG 3350-KCL-NA BICARB-NACL 420 G PO SOLR: 4000.0000 mL | ORAL | Status: DC

## 2013-01-28 MED ORDER — DICYCLOMINE HCL 10 MG PO CAPS: 10.0000 mg | ORAL_CAPSULE | Freq: Three times a day (TID) | ORAL | Status: DC

## 2013-01-28 NOTE — Progress Notes (Signed)
Primary Care Physician:  Catalina Pizza, MD  Primary Gastroenterologist:  Roetta Sessions, MD   Chief Complaint  Patient presents with  . Abdominal Pain  . Diarrhea    HPI:  Tiffany Ortiz is a 67 y.o. female here for further evaluation of abdominal pain and diarrhea.  Diagnosed with DM with neuropathy in 2011. Ever since then, change in bowel habits. Baseline IBS but never this bad. Certain foods cause her to have watery diarrhea. She does not tolerate dark greens or salad.  Tried to eliminate certain foods to see what is causing diarrhea. Bad episodes 1-2 times per month and may last for 1-2 weeks at a time. Difficult to describe how many stools she may have a day. No melena, brbpr. Will have intermittent solid stools. Occasional constipation. +lactose intolerance. Worse with milk and ice cream. Afraid to go anywhere. Urgency. Lots of bloating discomfort and flatulence. Intermittent incontinence. Heartburn for years. Dexilant started last year, insurance doesn't want to pay for it. Insurance wants her to take Nexium, but doesn't work. Used to take Imodium AD, but doesn't help. Lomotil doesn't help. Dysphagia to meats at times. Eats very slow/chews food thoroughly. EGD/ED helped a lot in 2008.  CT A/P with contrast on 09/30/12:  IMPRESSION:  1. No acute abnormality or finding to explain the patient's  symptoms.  2. Fatty infiltration of the liver.  3. Mild diverticulosis without diverticulitis.  4. Multilevel degenerative disc disease appearing worst at L4-5.   Current Outpatient Prescriptions  Medication Sig Dispense Refill  . ALPRAZolam (XANAX) 0.5 MG tablet Take 0.5 mg by mouth 2 (two) times daily as needed. For anxiety       . amphetamine-dextroamphetamine (ADDERALL) 10 MG tablet Take 10 mg by mouth daily.      . Armodafinil (NUVIGIL) 250 MG tablet Take 250 mg by mouth daily.        . chlorthalidone (HYGROTON) 25 MG tablet Take 25 mg by mouth daily.        Marland Kitchen dexlansoprazole  (DEXILANT) 60 MG capsule Take 60 mg by mouth daily.      Marland Kitchen gabapentin (NEURONTIN) 300 MG capsule Take 300 mg by mouth 3 (three) times daily.      Marland Kitchen glipiZIDE (GLUCOTROL XL) 5 MG 24 hr tablet Take 5 mg by mouth daily.      . Lactobacillus (PROBIOTIC ACIDOPHILUS PO) Take by mouth.      . linagliptin (TRADJENTA) 5 MG TABS tablet Take 5 mg by mouth daily.      Marland Kitchen losartan (COZAAR) 100 MG tablet Take 100 mg by mouth daily.        . Multiple Vitamins-Minerals (MULTIVITAMINS THER. W/MINERALS) TABS Take 1 tablet by mouth daily.        . Nutritional Supplements (DHEA PO) Take 1 tablet by mouth daily.        . valACYclovir (VALTREX) 500 MG tablet Take 500 mg by mouth 2 (two) times daily.      . verapamil (CALAN-SR) 240 MG CR tablet Take 240 mg by mouth daily.        . Vitamin D, Ergocalciferol, (DRISDOL) 50000 UNITS CAPS Take 50,000 Units by mouth every 7 (seven) days.        Marland Kitchen dicyclomine (BENTYL) 10 MG capsule Take 1 capsule (10 mg total) by mouth 4 (four) times daily -  before meals and at bedtime. As needed for diarrhea  90 capsule  0   No current facility-administered medications for this visit.    Allergies  as of 01/28/2013 - Review Complete 01/30/2011  Allergen Reaction Noted  . Erythromycin  01/28/2013  . Other  01/28/2013  . Oxycodone  01/28/2013  . Promethazine Nausea And Vomiting 01/30/2011    Past Medical History  Diagnosis Date  . Fibromyalgia   . Diabetes mellitus   . Renal disorder   . Hypertension   . IBS (irritable bowel syndrome)   . Vitamin D deficiency   . Sleep apnea     Past Surgical History  Procedure Laterality Date  . Abdominal hysterectomy      Cervical cancer, with incidental appendectomy  . Colonoscopy  09/22/2003    RMR: Diminutive rectal polyps cold biopsy/removed.  Otherwise normal rectal mucosa/Pancolonic diverticula.  The remainder of the colonic mucosa appeared normal  . Esophagogastroduodenoscopy  11/19/2006    [id number] Schatzki's ring with  overlying distal esophageal erosions consistent with erosive reflux esophagitis, status post dilation and disruption of ring as described above/ Small hiatal hernia/Otherwise normal stomach, first duodenum and second duodenum  . Bladder tack    . Breast lumpectomy      benign    Family History  Problem Relation Age of Onset  . Colon cancer Neg Hx   . Cancer Mother     ?ovarian    History   Social History  . Marital Status: Married    Spouse Name: N/A    Number of Children: N/A  . Years of Education: N/A   Occupational History  . Not on file.   Social History Main Topics  . Smoking status: Never Smoker   . Smokeless tobacco: Not on file  . Alcohol Use: No  . Drug Use: No  . Sexual Activity:    Other Topics Concern  . Not on file   Social History Narrative  . No narrative on file      ROS:  General: Negative for anorexia, weight loss, fever, chills, fatigue, weakness. Eyes: Negative for vision changes.  ENT: Negative for hoarseness, difficulty swallowing , nasal congestion. CV: Negative for chest pain, angina, palpitations, dyspnea on exertion, peripheral edema.  Respiratory: Negative for dyspnea at rest, dyspnea on exertion, cough, sputum, wheezing.  GI: See history of present illness. GU:  Negative for dysuria, hematuria, urinary incontinence, urinary frequency, nocturnal urination.  MS: Negative for joint pain, low back pain.  Derm: Negative for rash or itching.  Neuro: Negative for weakness, abnormal sensation, seizure, frequent headaches, memory loss, confusion.  Psych: Negative for anxiety, depression, suicidal ideation, hallucinations.  Endo: Negative for unusual weight change.  Heme: Negative for bruising or bleeding. Allergy: Negative for rash or hives.    Physical Examination:  BP 153/75  Pulse 83  Temp(Src) 97.9 F (36.6 C) (Oral)  Ht 5\' 1"  (1.549 m)  Wt 171 lb 3.2 oz (77.656 kg)  BMI 32.36 kg/m2   General: Well-nourished, well-developed in  no acute distress. Accompanied by daughter Head: Normocephalic, atraumatic.   Eyes: Conjunctiva pink, no icterus. Mouth: Oropharyngeal mucosa moist and pink , no lesions erythema or exudate. Neck: Supple without thyromegaly, masses, or lymphadenopathy.  Lungs: Clear to auscultation bilaterally.  Heart: Regular rate and rhythm, no murmurs rubs or gallops.  Abdomen: Bowel sounds are normal, nontender, nondistended, no hepatosplenomegaly or masses, no abdominal bruits or    hernia , no rebound or guarding.   Rectal: Deferred Extremities: No lower extremity edema. No clubbing or deformities.  Neuro: Alert and oriented x 4 , grossly normal neurologically.  Skin: Warm and dry, no rash or jaundice.  Psych: Alert and cooperative, normal mood and affect.  Labs: Labs from 12/01/2012. White blood cell count 7400, hemoglobin 13.2, hematocrit 38.5, MCV 86.3, platelets 368,000, sodium 141, potassium 4.1, glucose 151, BUN 18, creatinine 0.9, total bilirubin 0.3, alkaline phosphatase 107, AST 17, ALT 19, albumin 4, hemoglobin A1c 7.1.

## 2013-01-28 NOTE — Patient Instructions (Addendum)
1. Colonoscopy and upper endoscopy as scheduled. 2. Please have your blood work done as soon as possible. 3. Prescription for dicyclomine sent to your pharmacy to use as needed for diarrhea.

## 2013-01-28 NOTE — Assessment & Plan Note (Signed)
67 year old lady with a long history of baseline irritable bowel syndrome but three-year history of worsening diarrhea. Symptoms are intermittent but generally 50% of the time she has diarrhea. Significant postprandial component, fecal urgency, incontinence that time. Remote colonoscopy 9 years ago. Recommend screening for celiac. Colonoscopy in the near future with random colon biopsies. Cannot rule out diabetic enteropathy or pancreatic insufficiency.  Colonoscopy with random colon biopsies in the near future.  I have discussed the risks, alternatives, benefits with regards to but not limited to the risk of reaction to medication, bleeding, infection, perforation and the patient is agreeable to proceed. Written consent to be obtained.  Check TSH, tissue transglutaminase IgA, IgA level. Trial of dicyclomine 10 mg before meals and at bedtime as needed for diarrhea.

## 2013-01-28 NOTE — Progress Notes (Signed)
CC'd to PCP 

## 2013-01-28 NOTE — Assessment & Plan Note (Signed)
Recurrent solid food dysphagia. Recommend EGD with dilation in the near future.  I have discussed the risks, alternatives, benefits with regards to but not limited to the risk of reaction to medication, bleeding, infection, perforation and the patient is agreeable to proceed. Written consent to be obtained.  Continue Dexilant for now. Complete P.A.s as needed.

## 2013-01-29 LAB — CBC: platelet count: 368

## 2013-01-29 LAB — COMPREHENSIVE METABOLIC PANEL
ALT: 19 U/L (ref 7–35)
AST: 17 U/L
Albumin: 4
Alkaline Phosphatase: 107 U/L
Calcium: 9.7 mg/dL
Sodium: 141 mmol/L (ref 137–147)

## 2013-01-30 LAB — TISSUE TRANSGLUTAMINASE, IGA: Tissue Transglutaminase Ab, IgA: 2.7 U/mL (ref <20)

## 2013-01-30 LAB — IGA: IgA: 300 mg/dL (ref 69–380)

## 2013-02-02 NOTE — Progress Notes (Signed)
Quick Note:  Please let pt know her celiac screen and TSH were normal. Procedures as planned. ______

## 2013-02-03 ENCOUNTER — Encounter (HOSPITAL_COMMUNITY): Payer: Self-pay | Admitting: Pharmacy Technician

## 2013-02-04 ENCOUNTER — Encounter (HOSPITAL_COMMUNITY)
Admission: RE | Disposition: A | Discharge status: Home / Self Care | Payer: Self-pay | Source: Ambulatory Visit | Attending: Internal Medicine

## 2013-02-04 ENCOUNTER — Encounter (HOSPITAL_COMMUNITY): Payer: Self-pay | Admitting: *Deleted

## 2013-02-04 ENCOUNTER — Ambulatory Visit (HOSPITAL_COMMUNITY)
Admission: RE | Admit: 2013-02-04 | Discharge: 2013-02-04 | Disposition: A | Discharge status: Home / Self Care | Payer: Medicare Other | Source: Ambulatory Visit | Attending: Internal Medicine | Admitting: Internal Medicine

## 2013-02-04 DIAGNOSIS — K222 Esophageal obstruction: Secondary | ICD-10-CM | POA: Insufficient documentation

## 2013-02-04 DIAGNOSIS — Z01812 Encounter for preprocedural laboratory examination: Secondary | ICD-10-CM | POA: Insufficient documentation

## 2013-02-04 DIAGNOSIS — R197 Diarrhea, unspecified: Secondary | ICD-10-CM | POA: Diagnosis not present

## 2013-02-04 DIAGNOSIS — K621 Rectal polyp: Secondary | ICD-10-CM

## 2013-02-04 DIAGNOSIS — D126 Benign neoplasm of colon, unspecified: Secondary | ICD-10-CM | POA: Insufficient documentation

## 2013-02-04 DIAGNOSIS — K219 Gastro-esophageal reflux disease without esophagitis: Secondary | ICD-10-CM | POA: Diagnosis not present

## 2013-02-04 DIAGNOSIS — E119 Type 2 diabetes mellitus without complications: Secondary | ICD-10-CM | POA: Insufficient documentation

## 2013-02-04 DIAGNOSIS — K620 Anal polyp: Secondary | ICD-10-CM | POA: Diagnosis not present

## 2013-02-04 DIAGNOSIS — K529 Noninfective gastroenteritis and colitis, unspecified: Secondary | ICD-10-CM

## 2013-02-04 DIAGNOSIS — D371 Neoplasm of uncertain behavior of stomach: Secondary | ICD-10-CM | POA: Diagnosis not present

## 2013-02-04 DIAGNOSIS — K5730 Diverticulosis of large intestine without perforation or abscess without bleeding: Secondary | ICD-10-CM | POA: Diagnosis not present

## 2013-02-04 DIAGNOSIS — R1310 Dysphagia, unspecified: Secondary | ICD-10-CM | POA: Insufficient documentation

## 2013-02-04 DIAGNOSIS — K635 Polyp of colon: Secondary | ICD-10-CM

## 2013-02-04 DIAGNOSIS — I10 Essential (primary) hypertension: Secondary | ICD-10-CM | POA: Insufficient documentation

## 2013-02-04 DIAGNOSIS — D3A010 Benign carcinoid tumor of the duodenum: Secondary | ICD-10-CM | POA: Insufficient documentation

## 2013-02-04 DIAGNOSIS — R933 Abnormal findings on diagnostic imaging of other parts of digestive tract: Secondary | ICD-10-CM | POA: Diagnosis not present

## 2013-02-04 DIAGNOSIS — R1319 Other dysphagia: Secondary | ICD-10-CM

## 2013-02-04 DIAGNOSIS — K599 Functional intestinal disorder, unspecified: Secondary | ICD-10-CM | POA: Diagnosis not present

## 2013-02-04 HISTORY — PX: COLONOSCOPY WITH ESOPHAGOGASTRODUODENOSCOPY (EGD): SHX5779

## 2013-02-04 HISTORY — PX: BIOPSY: SHX5522

## 2013-02-04 HISTORY — DX: Polyp of colon: K63.5

## 2013-02-04 HISTORY — DX: Rectal polyp: K62.1

## 2013-02-04 LAB — GLUCOSE, CAPILLARY: Glucose-Capillary: 121 mg/dL — ABNORMAL HIGH (ref 70–99)

## 2013-02-04 SURGERY — COLONOSCOPY WITH ESOPHAGOGASTRODUODENOSCOPY (EGD)
Anesthesia: Moderate Sedation

## 2013-02-04 MED ORDER — SODIUM CHLORIDE 0.9 % IV SOLN
INTRAVENOUS | Status: DC
  Administered 2013-02-04: 10:00:00 via INTRAVENOUS

## 2013-02-04 MED ORDER — MIDAZOLAM HCL 5 MG/5ML IJ SOLN
INTRAMUSCULAR | Status: DC | PRN
  Administered 2013-02-04: 1 mg via INTRAVENOUS
  Administered 2013-02-04 (×2): 2 mg via INTRAVENOUS

## 2013-02-04 MED ORDER — BUTAMBEN-TETRACAINE-BENZOCAINE 2-2-14 % EX AERO
INHALATION_SPRAY | CUTANEOUS | Status: DC | PRN
  Administered 2013-02-04: 2 via TOPICAL

## 2013-02-04 MED ORDER — STERILE WATER FOR IRRIGATION IR SOLN
Status: DC | PRN
  Administered 2013-02-04: 11:00:00

## 2013-02-04 MED ORDER — ONDANSETRON HCL 4 MG/2ML IJ SOLN
INTRAMUSCULAR | Status: DC | PRN
  Administered 2013-02-04: 4 mg via INTRAVENOUS

## 2013-02-04 MED ORDER — MEPERIDINE HCL 100 MG/ML IJ SOLN
INTRAMUSCULAR | Status: DC | PRN
  Administered 2013-02-04: 50 mg via INTRAVENOUS
  Administered 2013-02-04 (×2): 25 mg via INTRAVENOUS

## 2013-02-04 NOTE — Op Note (Signed)
[facility] [address] [place], [address]   COLONOSCOPY PROCEDURE REPORT  PATIENT: Tiffany Ortiz, Tiffany Ortiz  MR#:         [id number] BIRTHDATE: July 23, 1945 , 67  yrs. old GENDER: Female ENDOSCOPIST: R.  Roetta Sessions, MD FACP FACG REFERRED BY:  Catalina Pizza, M.D. PROCEDURE DATE:  02/04/2013 PROCEDURE:     Ileocolonoscopy with segmental biopsy, stool sampling and snare polypectomy  INDICATIONS: Chronic diarrhea  INFORMED CONSENT:  The risks, benefits, alternatives and imponderables including but not limited to bleeding, perforation as well as the possibility of a missed lesion have been reviewed.  The potential for biopsy, lesion removal, etc. have also been discussed.  Questions have been answered.  All parties agreeable. Please see the history and physical in the medical record for more information.  MEDICATIONS: Versed 5 mg IV and Demerol 100 mg IV and Zofran 4 mg IV  DESCRIPTION OF PROCEDURE:  After a digital rectal exam was performed, the EC-[id number] ([id number])  colonoscope was advanced from the anus through the rectum and colon to the area of the cecum, ileocecal valve and appendiceal orifice.  The cecum was deeply intubated.  These structures were well-seen and photographed for the record.  From the level of the cecum and ileocecal valve, the scope was slowly and cautiously withdrawn.  The mucosal surfaces were carefully surveyed utilizing scope tip deflection to facilitate fold flattening as needed.  The scope was pulled down into the rectum where a thorough examination including retroflexion was performed.    FINDINGS:  Anal canal hemorrhoids; (1) 4 mm polyp in at 12 cm from the anal verge, otherwise, normal appearing rectum. Scattered pancolonic diverticula; (1) diminutive polyp in the mid ascending segment; otherwise remainder of the colonic mucosa appeared normal. the distal 5 cm of terminal ileal mucosa appeared normal.  THERAPEUTIC / DIAGNOSTIC  MANEUVERS PERFORMED:  The above-mentioned polyps were cold snared and cold biopsied, respectively. Segmental biopsies of a descending and ascending segments taken to evaluate for microscopic colitis. Also, a stool sample was submitted to the microbiology lab.  COMPLICATIONS: none  CECAL WITHDRAWAL TIME:  12 minutes  IMPRESSION:  rectal and colonic polyps-removed as described above. Pancolonic diverticulosis. Status post signal biopsy and stool collection  RECOMMENDATIONS: followup on pending studies.  See EGD report.   _______________________________ eSigned:  R. Roetta Sessions, MD FACP [facility] 02/04/2013 11:37 AM   CC:

## 2013-02-04 NOTE — H&P (View-Only) (Signed)
Primary Care Physician:  HALL, ZACH, MD  Primary Gastroenterologist:  Michael Rourk, MD   Chief Complaint  Patient presents with  . Abdominal Pain  . Diarrhea    HPI:  Tiffany Ortiz is a 67 y.o. female here for further evaluation of abdominal pain and diarrhea.  Diagnosed with DM with neuropathy in 2011. Ever since then, change in bowel habits. Baseline IBS but never this bad. Certain foods cause her to have watery diarrhea. She does not tolerate dark greens or salad.  Tried to eliminate certain foods to see what is causing diarrhea. Bad episodes 1-2 times per month and may last for 1-2 weeks at a time. Difficult to describe how many stools she may have a day. No melena, brbpr. Will have intermittent solid stools. Occasional constipation. +lactose intolerance. Worse with milk and ice cream. Afraid to go anywhere. Urgency. Lots of bloating discomfort and flatulence. Intermittent incontinence. Heartburn for years. Dexilant started last year, insurance doesn't want to pay for it. Insurance wants her to take Nexium, but doesn't work. Used to take Imodium AD, but doesn't help. Lomotil doesn't help. Dysphagia to meats at times. Eats very slow/chews food thoroughly. EGD/ED helped a lot in 2008.  CT A/P with contrast on 09/30/12:  IMPRESSION:  1. No acute abnormality or finding to explain the patient's  symptoms.  2. Fatty infiltration of the liver.  3. Mild diverticulosis without diverticulitis.  4. Multilevel degenerative disc disease appearing worst at L4-5.   Current Outpatient Prescriptions  Medication Sig Dispense Refill  . ALPRAZolam (XANAX) 0.5 MG tablet Take 0.5 mg by mouth 2 (two) times daily as needed. For anxiety       . amphetamine-dextroamphetamine (ADDERALL) 10 MG tablet Take 10 mg by mouth daily.      . Armodafinil (NUVIGIL) 250 MG tablet Take 250 mg by mouth daily.        . chlorthalidone (HYGROTON) 25 MG tablet Take 25 mg by mouth daily.        . dexlansoprazole  (DEXILANT) 60 MG capsule Take 60 mg by mouth daily.      . gabapentin (NEURONTIN) 300 MG capsule Take 300 mg by mouth 3 (three) times daily.      . glipiZIDE (GLUCOTROL XL) 5 MG 24 hr tablet Take 5 mg by mouth daily.      . Lactobacillus (PROBIOTIC ACIDOPHILUS PO) Take by mouth.      . linagliptin (TRADJENTA) 5 MG TABS tablet Take 5 mg by mouth daily.      . losartan (COZAAR) 100 MG tablet Take 100 mg by mouth daily.        . Multiple Vitamins-Minerals (MULTIVITAMINS THER. W/MINERALS) TABS Take 1 tablet by mouth daily.        . Nutritional Supplements (DHEA PO) Take 1 tablet by mouth daily.        . valACYclovir (VALTREX) 500 MG tablet Take 500 mg by mouth 2 (two) times daily.      . verapamil (CALAN-SR) 240 MG CR tablet Take 240 mg by mouth daily.        . Vitamin D, Ergocalciferol, (DRISDOL) 50000 UNITS CAPS Take 50,000 Units by mouth every 7 (seven) days.        . dicyclomine (BENTYL) 10 MG capsule Take 1 capsule (10 mg total) by mouth 4 (four) times daily -  before meals and at bedtime. As needed for diarrhea  90 capsule  0   No current facility-administered medications for this visit.    Allergies   as of 01/28/2013 - Review Complete 01/30/2011  Allergen Reaction Noted  . Erythromycin  01/28/2013  . Other  01/28/2013  . Oxycodone  01/28/2013  . Promethazine Nausea And Vomiting 01/30/2011    Past Medical History  Diagnosis Date  . Fibromyalgia   . Diabetes mellitus   . Renal disorder   . Hypertension   . IBS (irritable bowel syndrome)   . Vitamin D deficiency   . Sleep apnea     Past Surgical History  Procedure Laterality Date  . Abdominal hysterectomy      Cervical cancer, with incidental appendectomy  . Colonoscopy  09/22/2003    RMR: Diminutive rectal polyps cold biopsy/removed.  Otherwise normal rectal mucosa/Pancolonic diverticula.  The remainder of the colonic mucosa appeared normal  . Esophagogastroduodenoscopy  11/19/2006    RMR:Prominent Schatzki's ring with  overlying distal esophageal erosions consistent with erosive reflux esophagitis, status post dilation and disruption of ring as described above/ Small hiatal hernia/Otherwise normal stomach, first duodenum and second duodenum  . Bladder tack    . Breast lumpectomy      benign    Family History  Problem Relation Age of Onset  . Colon cancer Neg Hx   . Cancer Mother     ?ovarian    History   Social History  . Marital Status: Married    Spouse Name: N/A    Number of Children: N/A  . Years of Education: N/A   Occupational History  . Not on file.   Social History Main Topics  . Smoking status: Never Smoker   . Smokeless tobacco: Not on file  . Alcohol Use: No  . Drug Use: No  . Sexual Activity:    Other Topics Concern  . Not on file   Social History Narrative  . No narrative on file      ROS:  General: Negative for anorexia, weight loss, fever, chills, fatigue, weakness. Eyes: Negative for vision changes.  ENT: Negative for hoarseness, difficulty swallowing , nasal congestion. CV: Negative for chest pain, angina, palpitations, dyspnea on exertion, peripheral edema.  Respiratory: Negative for dyspnea at rest, dyspnea on exertion, cough, sputum, wheezing.  GI: See history of present illness. GU:  Negative for dysuria, hematuria, urinary incontinence, urinary frequency, nocturnal urination.  MS: Negative for joint pain, low back pain.  Derm: Negative for rash or itching.  Neuro: Negative for weakness, abnormal sensation, seizure, frequent headaches, memory loss, confusion.  Psych: Negative for anxiety, depression, suicidal ideation, hallucinations.  Endo: Negative for unusual weight change.  Heme: Negative for bruising or bleeding. Allergy: Negative for rash or hives.    Physical Examination:  BP 153/75  Pulse 83  Temp(Src) 97.9 F (36.6 C) (Oral)  Ht 5' 1" (1.549 m)  Wt 171 lb 3.2 oz (77.656 kg)  BMI 32.36 kg/m2   General: Well-nourished, well-developed in  no acute distress. Accompanied by daughter Head: Normocephalic, atraumatic.   Eyes: Conjunctiva pink, no icterus. Mouth: Oropharyngeal mucosa moist and pink , no lesions erythema or exudate. Neck: Supple without thyromegaly, masses, or lymphadenopathy.  Lungs: Clear to auscultation bilaterally.  Heart: Regular rate and rhythm, no murmurs rubs or gallops.  Abdomen: Bowel sounds are normal, nontender, nondistended, no hepatosplenomegaly or masses, no abdominal bruits or    hernia , no rebound or guarding.   Rectal: Deferred Extremities: No lower extremity edema. No clubbing or deformities.  Neuro: Alert and oriented x 4 , grossly normal neurologically.  Skin: Warm and dry, no rash or jaundice.     Psych: Alert and cooperative, normal mood and affect.  Labs: Labs from 12/01/2012. White blood cell count 7400, hemoglobin 13.2, hematocrit 38.5, MCV 86.3, platelets 368,000, sodium 141, potassium 4.1, glucose 151, BUN 18, creatinine 0.9, total bilirubin 0.3, alkaline phosphatase 107, AST 17, ALT 19, albumin 4, hemoglobin A1c 7.1. 

## 2013-02-04 NOTE — Interval H&P Note (Signed)
History and Physical Interval Note:  02/04/2013 10:29 AM  Golden Pop  has presented today for surgery, with the diagnosis of CHRONIC DIARRHEA, GERD, DYPHAGIA  The various methods of treatment have been discussed with the patient and family. After consideration of risks, benefits and other options for treatment, the patient has consented to  Procedure(s) with comments: COLONOSCOPY WITH ESOPHAGOGASTRODUODENOSCOPY (EGD) (N/A) - 10:15-moved to 9:30am BIOPSY (N/A) - SB BX AND RANDOM COLON BX as a surgical intervention .  [name] history has been reviewed, patient examined, no change in status, stable for surgery.  I have reviewed the patient's chart and labs.  Questions were answered to the patient's satisfaction.       No change. EGD with dilation colonoscopy as per plan.The risks, benefits, limitations, imponderables and alternatives regarding both EGD and colonoscopy have been reviewed with the patient. Questions have been answered. All parties agreeable.    Eula Listen

## 2013-02-04 NOTE — Discharge Instructions (Addendum)
Colonoscopy Discharge Instructions  Read the instructions outlined below and refer to this sheet in the next few weeks. These discharge instructions provide you with general information on caring for yourself after you leave the hospital. Your doctor may also give you specific instructions. While your treatment has been planned according to the most current medical practices available, unavoidable complications occasionally occur. If you have any problems or questions after discharge, call Dr. Gala Romney at [phone]. ACTIVITY  You may resume your regular activity, but move at a slower pace for the next 24 hours.   Take frequent rest periods for the next 24 hours.   Walking will help get rid of the air and reduce the bloated feeling in your belly (abdomen).   No driving for 24 hours (because of the medicine (anesthesia) used during the test).    Do not sign any important legal documents or operate any machinery for 24 hours (because of the anesthesia used during the test).  NUTRITION  Drink plenty of fluids.   You may resume your normal diet as instructed by your doctor.   Begin with a light meal and progress to your normal diet. Heavy or fried foods are harder to digest and may make you feel sick to your stomach (nauseated).   Avoid alcoholic beverages for 24 hours or as instructed.  MEDICATIONS  You may resume your normal medications unless your doctor tells you otherwise.  WHAT YOU CAN EXPECT TODAY  Some feelings of bloating in the abdomen.   Passage of more gas than usual.   Spotting of blood in your stool or on the toilet paper.  IF YOU HAD POLYPS REMOVED DURING THE COLONOSCOPY:  No aspirin products for 7 days or as instructed.   No alcohol for 7 days or as instructed.   Eat a soft diet for the next 24 hours.  FINDING OUT THE RESULTS OF YOUR TEST Not all test results are available during your visit. If your test results are not back during the visit, make an appointment  with your caregiver to find out the results. Do not assume everything is normal if you have not heard from your caregiver or the medical facility. It is important for you to follow up on all of your test results.  SEEK IMMEDIATE MEDICAL ATTENTION IF:  You have more than a spotting of blood in your stool.   Your belly is swollen (abdominal distention).   You are nauseated or vomiting.   You have a temperature over 101.  You have abdominal pain or discomfort that is severe or gets worse throughout the day. EGD Discharge instructions Please read the instructions outlined below and refer to this sheet in the next few weeks. These discharge instructions provide you with general information on caring for yourself after you leave the hospital. Your doctor may also give you specific instructions. While your treatment has been planned according to the most current medical practices available, unavoidable complications occasionally occur. If you have any problems or questions after discharge, please call your doctor. ACTIVITY You may resume your regular activity but move at a slower pace for the next 24 hours.  Take frequent rest periods for the next 24 hours.  Walking will help expel (get rid of) the air and reduce the bloated feeling in your abdomen.  No driving for 24 hours (because of the anesthesia (medicine) used during the test).  You may shower.  Do not sign any important legal documents or operate any machinery for 24  hours (because of the anesthesia used during the test).  NUTRITION Drink plenty of fluids.  You may resume your normal diet.  Begin with a light meal and progress to your normal diet.  Avoid alcoholic beverages for 24 hours or as instructed by your caregiver.  MEDICATIONS You may resume your normal medications unless your caregiver tells you otherwise.  WHAT YOU CAN EXPECT TODAY You may experience abdominal discomfort such as a feeling of fullness or gas pains.   FOLLOW-UP Your doctor will discuss the results of your test with you.  SEEK IMMEDIATE MEDICAL ATTENTION IF ANY OF THE FOLLOWING OCCUR: Excessive nausea (feeling sick to your stomach) and/or vomiting.  Severe abdominal pain and distention (swelling).  Trouble swallowing.  Temperature over 101 F (37.8 C).  Rectal bleeding or vomiting of blood.     Continue Dexilant 60 mg daily  Diverticulosis and polyp information provided  Further recommendations to follow pending review of lab report results   Diverticulosis Diverticulosis is a common condition that develops when small pouches (diverticula) form in the wall of the colon. The risk of diverticulosis increases with age. It happens more often in people who eat a low-fiber diet. Most individuals with diverticulosis have no symptoms. Those individuals with symptoms usually experience abdominal pain, constipation, or loose stools (diarrhea). HOME CARE INSTRUCTIONS   Increase the amount of fiber in your diet as directed by your caregiver or dietician. This may reduce symptoms of diverticulosis.  Your caregiver may recommend taking a dietary fiber supplement.  Drink at least 6 to 8 glasses of water each day to prevent constipation.  Try not to strain when you have a bowel movement.  Your caregiver may recommend avoiding nuts and seeds to prevent complications, although this is still an uncertain benefit.  Only take over-the-counter or prescription medicines for pain, discomfort, or fever as directed by your caregiver. FOODS WITH HIGH FIBER CONTENT INCLUDE:  Fruits. Apple, peach, pear, tangerine, raisins, prunes.  Vegetables. Brussels sprouts, asparagus, broccoli, cabbage, carrot, cauliflower, romaine lettuce, spinach, summer squash, tomato, winter squash, zucchini.  Starchy Vegetables. Baked beans, kidney beans, lima beans, split peas, lentils, potatoes (with skin).  Grains. Whole wheat bread, [place] rice, bran flake cereal, plain  oatmeal, white rice, shredded wheat, bran muffins. SEEK IMMEDIATE MEDICAL CARE IF:   You develop increasing pain or severe bloating.  You have an oral temperature above 102 F (38.9 C), not controlled by medicine.  You develop vomiting or bowel movements that are bloody or black.   Colon Polyps Polyps are lumps of extra tissue growing inside the body. Polyps can grow in the large intestine (colon). Most colon polyps are noncancerous (benign). However, some colon polyps can become cancerous over time. Polyps that are larger than a pea may be harmful. To be safe, caregivers remove and test all polyps. CAUSES  Polyps form when mutations in the genes cause your cells to grow and divide even though no more tissue is needed. RISK FACTORS There are a number of risk factors that can increase your chances of getting colon polyps. They include:  Being older than 50 years.  Family history of colon polyps or colon cancer.  Long-term colon diseases, such as colitis or Crohn disease.  Being overweight.  Smoking.  Being inactive.  Drinking too much alcohol. SYMPTOMS  Most small polyps do not cause symptoms. If symptoms are present, they may include:  Blood in the stool. The stool may look dark red or black.  Constipation or diarrhea that  lasts longer than 1 week. DIAGNOSIS People often do not know they have polyps until their caregiver finds them during a regular checkup. Your caregiver can use 4 tests to check for polyps:  Digital rectal exam. The caregiver wears gloves and feels inside the rectum. This test would find polyps only in the rectum.  Barium enema. The caregiver puts a liquid called barium into your rectum before taking X-rays of your colon. Barium makes your colon look white. Polyps are dark, so they are easy to see in the X-ray pictures.  Sigmoidoscopy. A thin, flexible tube (sigmoidoscope) is placed into your rectum. The sigmoidoscope has a light and tiny camera in it.  The caregiver uses the sigmoidoscope to look at the last third of your colon.  Colonoscopy. This test is like sigmoidoscopy, but the caregiver looks at the entire colon. This is the most common method for finding and removing polyps. TREATMENT  Any polyps will be removed during a sigmoidoscopy or colonoscopy. The polyps are then tested for cancer. PREVENTION  To help lower your risk of getting more colon polyps:  Eat plenty of fruits and vegetables. Avoid eating fatty foods.  Do not smoke.  Avoid drinking alcohol.  Exercise every day.  Lose weight if recommended by your caregiver.  Eat plenty of calcium and folate. Foods that are rich in calcium include milk, cheese, and broccoli. Foods that are rich in folate include chickpeas, kidney beans, and spinach. HOME CARE INSTRUCTIONS Keep all follow-up appointments as directed by your caregiver. You may need periodic exams to check for polyps. SEEK MEDICAL CARE IF: You notice bleeding during a bowel movement.

## 2013-02-04 NOTE — Op Note (Signed)
[facility] [address] [place], [address]   ENDOSCOPY PROCEDURE REPORT  PATIENT: Tiffany Ortiz, Tiffany Ortiz  MR#: [id number] BIRTHDATE: 08-Jan-1946 , 67  yrs. old GENDER: Female ENDOSCOPIST: R.  Roetta Sessions, MD FACP FACG REFERRED BY:  Catalina Pizza, M.D. PROCEDURE DATE:  02/04/2013 PROCEDURE:     EGD with Elease Hashimoto dilation followed by gastric and duodenal biopsy  INDICATIONS:     GERD; recurrent esophageal dysphagia.  INFORMED CONSENT:   The risks, benefits, limitations, alternatives and imponderables have been discussed.  The potential for biopsy, esophogeal dilation, etc. have also been reviewed.  Questions have been answered.  All parties agreeable.  Please see the history and physical in the medical record for more information.  MEDICATIONS:   Versed 5 mg IV and Demerol 75 mg IV in divided doses Zofran 4 mg IV. Cetacaine spray.  DESCRIPTION OF PROCEDURE:   The EG-[id number] ([id number])  endoscope was introduced through the mouth and advanced to the second portion of the duodenum without difficulty or limitations.  The mucosal surfaces were surveyed very carefully during advancement of the scope and upon withdrawal.  Retroflexion view of the proximal stomach and esophagogastric junction was performed.      FINDINGS: Schatzki's ring present; otherwise normal-appearing esophagus. Stomach empty. Slightly nodular mucosa in the cardia region; otherwise gastric mucosa appeared normal. No ulcer or infiltrating process. Patent pylorus. Examination of bulb and second portion of the proximal 1.25 cm sessile polypoid lesion in the posterior bulb. Please see above.  THERAPEUTIC / DIAGNOSTIC MANEUVERS PERFORMED:  A 56 4 French Maloney dilators passed to full insertion with minimal resistance. A look back revealed regular nicely ruptured without apparent complication. Subsequently, biopsies of duodenal nodule taken for histologic study. Finally, biopsies of the nodular chordee  because were taken for histologic study.   COMPLICATIONS:  None  IMPRESSION:  Schatzki's ring-status post dilation as described above. Abnormal gastric mucosa of uncertain significance-status post biopsy. Duodenal nodule-status post biopsy  RECOMMENDATIONS:  Continue Dexilant 60 mg daily. Followup on pathology. See colonoscopy report.    _______________________________ R. Roetta Sessions, MD FACP [facility] eSigned:  R. Roetta Sessions, MD FACP [facility] 02/04/2013 10:57 AM     CC:

## 2013-02-05 ENCOUNTER — Telehealth: Payer: Self-pay | Admitting: Internal Medicine

## 2013-02-05 LAB — OVA AND PARASITE EXAMINATION: Ova and parasites: NONE SEEN

## 2013-02-05 NOTE — Telephone Encounter (Signed)
Dr. 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 I called the patient and informed her of the diagnosis and the need for further evaluation beginning with endoscopic ultrasound. Colon biopsies and remainder stool studies pending.

## 2013-02-06 ENCOUNTER — Other Ambulatory Visit: Payer: Self-pay | Admitting: Internal Medicine

## 2013-02-06 DIAGNOSIS — C7A010 Malignant carcinoid tumor of the duodenum: Secondary | ICD-10-CM

## 2013-02-06 NOTE — Telephone Encounter (Signed)
Tiffany Ortiz,  EUS in the works.  In parallel, can we go ahead and schedule an office consultation with Dr. Donell Beers for consideration of surgical resection.

## 2013-02-06 NOTE — Telephone Encounter (Signed)
Referral has been made to [facility] to Dr. Donell Beers

## 2013-02-06 NOTE — Telephone Encounter (Signed)
Called and informed pt.  Tiffany Ortiz, please refer to Dr. Donell Beers

## 2013-02-06 NOTE — Telephone Encounter (Signed)
Will plan on upper EUS.  (Patty, 60 min, radial +/- linear, + MAC, duodenal carcinoid).    Kathlene November, can you also begin referral to general surgeon to consider resection?; small bowel carcinoids are recommended to be resected surgically (if she is a surgical candidate).  Thanks

## 2013-02-08 LAB — STOOL CULTURE

## 2013-02-09 ENCOUNTER — Other Ambulatory Visit: Payer: Self-pay

## 2013-02-09 DIAGNOSIS — R933 Abnormal findings on diagnostic imaging of other parts of digestive tract: Secondary | ICD-10-CM

## 2013-02-09 DIAGNOSIS — D3A029 Benign carcinoid tumor of the large intestine, unspecified portion: Secondary | ICD-10-CM

## 2013-02-09 NOTE — Telephone Encounter (Signed)
EUS scheduled, pt instructed and medications reviewed.  Patient instructions mailed to home.  Patient to call with any questions or concerns.  

## 2013-02-10 ENCOUNTER — Encounter (HOSPITAL_COMMUNITY): Payer: Self-pay | Admitting: Internal Medicine

## 2013-02-11 ENCOUNTER — Ambulatory Visit (INDEPENDENT_AMBULATORY_CARE_PROVIDER_SITE_OTHER): Payer: Medicare Other | Admitting: General Surgery

## 2013-02-11 ENCOUNTER — Encounter: Payer: Self-pay | Admitting: Internal Medicine

## 2013-02-11 ENCOUNTER — Encounter (INDEPENDENT_AMBULATORY_CARE_PROVIDER_SITE_OTHER): Payer: Self-pay | Admitting: General Surgery

## 2013-02-11 ENCOUNTER — Encounter (HOSPITAL_COMMUNITY): Payer: Self-pay | Admitting: *Deleted

## 2013-02-11 VITALS — BP 150/88 | HR 98 | Temp 97.3°F | Ht 63.5 in | Wt 170.8 lb

## 2013-02-11 DIAGNOSIS — C7A010 Malignant carcinoid tumor of the duodenum: Secondary | ICD-10-CM | POA: Diagnosis not present

## 2013-02-11 NOTE — Telephone Encounter (Signed)
Opened in error

## 2013-02-11 NOTE — Patient Instructions (Signed)
We will see what Dr. Christella Hartigan finds tomorrow.  Depending on his findings, we will likely be scheduling surgery.

## 2013-02-12 ENCOUNTER — Ambulatory Visit (HOSPITAL_COMMUNITY)
Admission: RE | Admit: 2013-02-12 | Discharge: 2013-02-12 | Disposition: A | Discharge status: Home / Self Care | Payer: Medicare Other | Source: Ambulatory Visit | Attending: Gastroenterology | Admitting: Gastroenterology

## 2013-02-12 ENCOUNTER — Encounter (HOSPITAL_COMMUNITY)
Admission: RE | Disposition: A | Discharge status: Home / Self Care | Payer: Self-pay | Source: Ambulatory Visit | Attending: Gastroenterology

## 2013-02-12 ENCOUNTER — Encounter (HOSPITAL_COMMUNITY): Payer: Medicare Other | Admitting: Anesthesiology

## 2013-02-12 ENCOUNTER — Encounter (HOSPITAL_COMMUNITY): Payer: Self-pay | Admitting: Gastroenterology

## 2013-02-12 ENCOUNTER — Ambulatory Visit (HOSPITAL_COMMUNITY): Payer: Medicare Other | Admitting: Anesthesiology

## 2013-02-12 DIAGNOSIS — K7689 Other specified diseases of liver: Secondary | ICD-10-CM | POA: Diagnosis not present

## 2013-02-12 DIAGNOSIS — Z79899 Other long term (current) drug therapy: Secondary | ICD-10-CM | POA: Diagnosis not present

## 2013-02-12 DIAGNOSIS — M51379 Other intervertebral disc degeneration, lumbosacral region without mention of lumbar back pain or lower extremity pain: Secondary | ICD-10-CM | POA: Insufficient documentation

## 2013-02-12 DIAGNOSIS — K5730 Diverticulosis of large intestine without perforation or abscess without bleeding: Secondary | ICD-10-CM | POA: Diagnosis not present

## 2013-02-12 DIAGNOSIS — I10 Essential (primary) hypertension: Secondary | ICD-10-CM | POA: Insufficient documentation

## 2013-02-12 DIAGNOSIS — K449 Diaphragmatic hernia without obstruction or gangrene: Secondary | ICD-10-CM | POA: Diagnosis not present

## 2013-02-12 DIAGNOSIS — R933 Abnormal findings on diagnostic imaging of other parts of digestive tract: Secondary | ICD-10-CM | POA: Diagnosis not present

## 2013-02-12 DIAGNOSIS — M5137 Other intervertebral disc degeneration, lumbosacral region: Secondary | ICD-10-CM | POA: Insufficient documentation

## 2013-02-12 DIAGNOSIS — E119 Type 2 diabetes mellitus without complications: Secondary | ICD-10-CM | POA: Insufficient documentation

## 2013-02-12 DIAGNOSIS — C7A010 Malignant carcinoid tumor of the duodenum: Secondary | ICD-10-CM | POA: Diagnosis not present

## 2013-02-12 HISTORY — DX: Headache: R51

## 2013-02-12 HISTORY — PX: EUS: SHX5427

## 2013-02-12 SURGERY — UPPER ENDOSCOPIC ULTRASOUND (EUS) LINEAR
Anesthesia: Monitor Anesthesia Care

## 2013-02-12 MED ORDER — PROPOFOL INFUSION 10 MG/ML OPTIME
INTRAVENOUS | Status: DC | PRN
  Administered 2013-02-12: 100 ug/kg/min via INTRAVENOUS

## 2013-02-12 MED ORDER — KETAMINE HCL 10 MG/ML IJ SOLN
INTRAMUSCULAR | Status: DC | PRN
  Administered 2013-02-12 (×4): 10 mg via INTRAVENOUS

## 2013-02-12 MED ORDER — LACTATED RINGERS IV SOLN
INTRAVENOUS | Status: DC
  Administered 2013-02-12: 1000 mL via INTRAVENOUS

## 2013-02-12 MED ORDER — MIDAZOLAM HCL 5 MG/5ML IJ SOLN
INTRAMUSCULAR | Status: DC | PRN
  Administered 2013-02-12 (×2): 1 mg via INTRAVENOUS

## 2013-02-12 NOTE — Transfer of Care (Signed)
Immediate Anesthesia Transfer of Care Note  Patient: Tiffany Ortiz  Procedure(s) Performed: Procedure(s): UPPER ENDOSCOPIC ULTRASOUND (EUS) LINEAR (N/A)  Patient Location: PACU and Endoscopy Unit  Anesthesia Type:MAC  Level of Consciousness: awake, sedated and patient cooperative  Airway & Oxygen Therapy: Patient Spontanous Breathing and Patient connected to nasal cannula oxygen  Post-op Assessment: Report given to PACU RN, Post -op Vital signs reviewed and stable and Patient moving all extremities X 4  Post vital signs: Reviewed and stable  Complications: No apparent anesthesia complications

## 2013-02-12 NOTE — Interval H&P Note (Signed)
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 After consideration of risks, benefits and other options for treatment, the patient has consented to  Procedure(s): UPPER ENDOSCOPIC ULTRASOUND (EUS) LINEAR (N/A) as a surgical intervention .  [name] history has been reviewed, patient examined, no change in status, stable for surgery.  I have reviewed the patient's chart and labs.  Questions were answered to the patient's satisfaction.     Rachael Fee

## 2013-02-12 NOTE — H&P (View-Only) (Signed)
Primary Care Physician:  Catalina Pizza, MD  Primary Gastroenterologist:  Roetta Sessions, MD   Chief Complaint  Patient presents with  . Abdominal Pain  . Diarrhea    HPI:  Tiffany Ortiz is a 67 y.o. female here for further evaluation of abdominal pain and diarrhea.  Diagnosed with DM with neuropathy in 2011. Ever since then, change in bowel habits. Baseline IBS but never this bad. Certain foods cause her to have watery diarrhea. She does not tolerate dark greens or salad.  Tried to eliminate certain foods to see what is causing diarrhea. Bad episodes 1-2 times per month and may last for 1-2 weeks at a time. Difficult to describe how many stools she may have a day. No melena, brbpr. Will have intermittent solid stools. Occasional constipation. +lactose intolerance. Worse with milk and ice cream. Afraid to go anywhere. Urgency. Lots of bloating discomfort and flatulence. Intermittent incontinence. Heartburn for years. Dexilant started last year, insurance doesn't want to pay for it. Insurance wants her to take Nexium, but doesn't work. Used to take Imodium AD, but doesn't help. Lomotil doesn't help. Dysphagia to meats at times. Eats very slow/chews food thoroughly. EGD/ED helped a lot in 2008.  CT A/P with contrast on 09/30/12:  IMPRESSION:  1. No acute abnormality or finding to explain the patient's  symptoms.  2. Fatty infiltration of the liver.  3. Mild diverticulosis without diverticulitis.  4. Multilevel degenerative disc disease appearing worst at L4-5.   Current Outpatient Prescriptions  Medication Sig Dispense Refill  . ALPRAZolam (XANAX) 0.5 MG tablet Take 0.5 mg by mouth 2 (two) times daily as needed. For anxiety       . amphetamine-dextroamphetamine (ADDERALL) 10 MG tablet Take 10 mg by mouth daily.      . Armodafinil (NUVIGIL) 250 MG tablet Take 250 mg by mouth daily.        . chlorthalidone (HYGROTON) 25 MG tablet Take 25 mg by mouth daily.        Marland Kitchen dexlansoprazole  (DEXILANT) 60 MG capsule Take 60 mg by mouth daily.      Marland Kitchen gabapentin (NEURONTIN) 300 MG capsule Take 300 mg by mouth 3 (three) times daily.      Marland Kitchen glipiZIDE (GLUCOTROL XL) 5 MG 24 hr tablet Take 5 mg by mouth daily.      . Lactobacillus (PROBIOTIC ACIDOPHILUS PO) Take by mouth.      . linagliptin (TRADJENTA) 5 MG TABS tablet Take 5 mg by mouth daily.      Marland Kitchen losartan (COZAAR) 100 MG tablet Take 100 mg by mouth daily.        . Multiple Vitamins-Minerals (MULTIVITAMINS THER. W/MINERALS) TABS Take 1 tablet by mouth daily.        . Nutritional Supplements (DHEA PO) Take 1 tablet by mouth daily.        . valACYclovir (VALTREX) 500 MG tablet Take 500 mg by mouth 2 (two) times daily.      . verapamil (CALAN-SR) 240 MG CR tablet Take 240 mg by mouth daily.        . Vitamin D, Ergocalciferol, (DRISDOL) 50000 UNITS CAPS Take 50,000 Units by mouth every 7 (seven) days.        Marland Kitchen dicyclomine (BENTYL) 10 MG capsule Take 1 capsule (10 mg total) by mouth 4 (four) times daily -  before meals and at bedtime. As needed for diarrhea  90 capsule  0   No current facility-administered medications for this visit.    Allergies  as of 01/28/2013 - Review Complete 01/30/2011  Allergen Reaction Noted  . Erythromycin  01/28/2013  . Other  01/28/2013  . Oxycodone  01/28/2013  . Promethazine Nausea And Vomiting 01/30/2011    Past Medical History  Diagnosis Date  . Fibromyalgia   . Diabetes mellitus   . Renal disorder   . Hypertension   . IBS (irritable bowel syndrome)   . Vitamin D deficiency   . Sleep apnea     Past Surgical History  Procedure Laterality Date  . Abdominal hysterectomy      Cervical cancer, with incidental appendectomy  . Colonoscopy  09/22/2003    RMR: Diminutive rectal polyps cold biopsy/removed.  Otherwise normal rectal mucosa/Pancolonic diverticula.  The remainder of the colonic mucosa appeared normal  . Esophagogastroduodenoscopy  11/19/2006    [id number] Schatzki's ring with  overlying distal esophageal erosions consistent with erosive reflux esophagitis, status post dilation and disruption of ring as described above/ Small hiatal hernia/Otherwise normal stomach, first duodenum and second duodenum  . Bladder tack    . Breast lumpectomy      benign    Family History  Problem Relation Age of Onset  . Colon cancer Neg Hx   . Cancer Mother     ?ovarian    History   Social History  . Marital Status: Married    Spouse Name: N/A    Number of Children: N/A  . Years of Education: N/A   Occupational History  . Not on file.   Social History Main Topics  . Smoking status: Never Smoker   . Smokeless tobacco: Not on file  . Alcohol Use: No  . Drug Use: No  . Sexual Activity:    Other Topics Concern  . Not on file   Social History Narrative  . No narrative on file      ROS:  General: Negative for anorexia, weight loss, fever, chills, fatigue, weakness. Eyes: Negative for vision changes.  ENT: Negative for hoarseness, difficulty swallowing , nasal congestion. CV: Negative for chest pain, angina, palpitations, dyspnea on exertion, peripheral edema.  Respiratory: Negative for dyspnea at rest, dyspnea on exertion, cough, sputum, wheezing.  GI: See history of present illness. GU:  Negative for dysuria, hematuria, urinary incontinence, urinary frequency, nocturnal urination.  MS: Negative for joint pain, low back pain.  Derm: Negative for rash or itching.  Neuro: Negative for weakness, abnormal sensation, seizure, frequent headaches, memory loss, confusion.  Psych: Negative for anxiety, depression, suicidal ideation, hallucinations.  Endo: Negative for unusual weight change.  Heme: Negative for bruising or bleeding. Allergy: Negative for rash or hives.    Physical Examination:  BP 153/75  Pulse 83  Temp(Src) 97.9 F (36.6 C) (Oral)  Ht 5\' 1"  (1.549 m)  Wt 171 lb 3.2 oz (77.656 kg)  BMI 32.36 kg/m2   General: Well-nourished, well-developed in  no acute distress. Accompanied by daughter Head: Normocephalic, atraumatic.   Eyes: Conjunctiva pink, no icterus. Mouth: Oropharyngeal mucosa moist and pink , no lesions erythema or exudate. Neck: Supple without thyromegaly, masses, or lymphadenopathy.  Lungs: Clear to auscultation bilaterally.  Heart: Regular rate and rhythm, no murmurs rubs or gallops.  Abdomen: Bowel sounds are normal, nontender, nondistended, no hepatosplenomegaly or masses, no abdominal bruits or    hernia , no rebound or guarding.   Rectal: Deferred Extremities: No lower extremity edema. No clubbing or deformities.  Neuro: Alert and oriented x 4 , grossly normal neurologically.  Skin: Warm and dry, no rash or jaundice.  Psych: Alert and cooperative, normal mood and affect.  Labs: Labs from 12/01/2012. White blood cell count 7400, hemoglobin 13.2, hematocrit 38.5, MCV 86.3, platelets 368,000, sodium 141, potassium 4.1, glucose 151, BUN 18, creatinine 0.9, total bilirubin 0.3, alkaline phosphatase 107, AST 17, ALT 19, albumin 4, hemoglobin A1c 7.1.

## 2013-02-12 NOTE — Anesthesia Preprocedure Evaluation (Addendum)
Anesthesia Evaluation  Patient identified by MRN, date of birth, ID band Patient awake    Reviewed: Allergy & Precautions, H&P , NPO status , Patient's Chart, lab work & pertinent test results  Airway Mallampati: II TM Distance: >3 FB Neck ROM: full    Dental no notable dental hx. (+) Teeth Intact and Dental Advisory Given   Pulmonary neg pulmonary ROS, sleep apnea and Continuous Positive Airway Pressure Ventilation ,  breath sounds clear to auscultation  Pulmonary exam normal       Cardiovascular Exercise Tolerance: Good hypertension, Pt. on medications negative cardio ROS  Rhythm:regular Rate:Normal     Neuro/Psych negative neurological ROS  negative psych ROS   GI/Hepatic negative GI ROS, Neg liver ROS, GERD-  ,  Endo/Other  negative endocrine ROSdiabetes, Well Controlled, Type 2, Oral Hypoglycemic Agents  Renal/GU negative Renal ROS  negative genitourinary   Musculoskeletal   Abdominal (+) + obese,   Peds  Hematology negative hematology ROS (+)   Anesthesia Other Findings   Reproductive/Obstetrics negative OB ROS                        Anesthesia Physical Anesthesia Plan  ASA: III  Anesthesia Plan: MAC   Post-op Pain Management:    Induction:   Airway Management Planned: Simple Face Mask  Additional Equipment:   Intra-op Plan:   Post-operative Plan:   Informed Consent: I have reviewed the patients History and Physical, chart, labs and discussed the procedure including the risks, benefits and alternatives for the proposed anesthesia with the patient or authorized representative who has indicated his/her understanding and acceptance.   Dental Advisory Given  Plan Discussed with: CRNA and Surgeon  Anesthesia Plan Comments:         Anesthesia Quick Evaluation

## 2013-02-12 NOTE — Anesthesia Postprocedure Evaluation (Signed)
  Anesthesia Post-op Note  Patient: Tiffany Ortiz  Procedure(s) Performed: Procedure(s) (LRB): UPPER ENDOSCOPIC ULTRASOUND (EUS) LINEAR (N/A)  Patient Location: PACU  Anesthesia Type: MAC  Level of Consciousness: awake and alert   Airway and Oxygen Therapy: Patient Spontanous Breathing  Post-op Pain: mild  Post-op Assessment: Post-op Vital signs reviewed, Patient's Cardiovascular Status Stable, Respiratory Function Stable, Patent Airway and No signs of Nausea or vomiting  Last Vitals:  Filed Vitals:   02/12/13 0840  BP: 143/79  Pulse:   Temp:   Resp: 14    Post-op Vital Signs: stable   Complications: No apparent anesthesia complications

## 2013-02-12 NOTE — Op Note (Signed)
[facility] [address] [place], [address]   ENDOSCOPIC ULTRASOUND PROCEDURE REPORT  PATIENT: Tiffany, Ortiz  MR#: [id number] BIRTHDATE: 03-12-1946  GENDER: Female ENDOSCOPIST: Rachael Fee, MD REFERRED BY:  Roetta Sessions, M.D. PROCEDURE DATE:  02/12/2013 PROCEDURE:   Upper EUS ASA CLASS:      Class III INDICATIONS:   recent EGD (Dr.  Jena Gauss) found duodenal bulb carcinoid. MEDICATIONS: MAC sedation, administered by CRNA  DESCRIPTION OF PROCEDURE:   After the risks benefits and alternatives of the procedure were  explained, informed consent was obtained. The patient was then placed in the left, lateral, decubitus postion and IV sedation was administered. Throughout the procedure, the patients blood pressure, pulse and oxygen saturations were monitored continuously.  Under direct visualization, the Pentax Radial EUS [id number]  endoscope was introduced through the mouth  and advanced to the second portion of the duodenum .  Water was used as necessary to provide an acoustic interface.  Upon completion of the imaging, water was removed and the patient was sent to the recovery room in satisfactory condition.  Endoscopic findings: 1. Normal esophagus 2. Hiatal hernia, medium to large amount of solid/liquid food in stomach without anatomic outlet obstruction 3. In the distal duodenal bulb, along posterior wall there was 1-2cm raised nodule. The duodenum was otherwise normal.  The nodule lays at least 5-6cm from the major papilla.  EUS findings: 1. The duodenal bulb nodule described above corresponded with a 1.3cm hypoechoic lesion that lays in deep mucosa layer, appears to focally involve the muscularis propria layer. 2. There was no periportal, perigastric, celica adenopathy. 3. Pancreatic parenchyma was lobular, honeycombed appearing without any discrete pancreatic masses. 4. Main pancreatic duct was normal. 5. CBD was normal,  non-dilated  Impression: 1.3cm posterior duodenal bulb carcinoid that lays in deep mucosa, focally involving the muscularis propria layer.  Small bowel carcinoids have risk of regional adenopathy and generally require surgical resection. She has already met Dr. Donell Beers yesterday who will be planning for surgery.  _______________________________ eSignedRachael Fee, MD 02/12/2013 8:16 AM

## 2013-02-13 ENCOUNTER — Encounter (HOSPITAL_COMMUNITY): Payer: Self-pay | Admitting: Gastroenterology

## 2013-02-13 DIAGNOSIS — C7A010 Malignant carcinoid tumor of the duodenum: Secondary | ICD-10-CM | POA: Insufficient documentation

## 2013-02-13 NOTE — Assessment & Plan Note (Addendum)
Patient appears to have a carcinoid tumor in the duodenal bulb. She is undergoing endoscopic ultrasound with Dr. Christella Hartigan, however this will likely just need to be surgically resected. We will make an attempt to do this laparoscopically. However, I advised the patient is unlikely that we will be successful based on the orientation of the mass. We will likely need to have exploratory laparotomy to decrease the risk of leaking.  She will have the EUS and we will plan surgery following that.  I will likely need to see her back prior to surgery.  30 min spent in evaluation, examination, coordination of care, and counseling.  >50% spent in counseling.

## 2013-02-13 NOTE — Progress Notes (Signed)
Chief Complaint  Patient presents with  . New Evaluation    eval tumor    HISTORY: Patient is a 68 year old female who is referred by Dr. Raiford Simmonds for a duodenal carcinoid tumor. She underwent upper and lower endoscopy because of chronic diarrhea and dysphagia. She had benign findings on her colonoscopy. In her duodenal bulb, she was noted to have a low-grade neuroendocrine tumor. This appeared to be approximately a centimeter in diameter. Her principal complaint is this chronic diarrhea. She denies significant weight loss. She does have fibromyalgia and is diabetic. She has a prior diagnosis of irritable bowel syndrome.  Past Medical History  Diagnosis Date  . Fibromyalgia   . Diabetes mellitus   . Renal disorder   . Hypertension   . IBS (irritable bowel syndrome)   . Vitamin D deficiency   . Sleep apnea   . Headache(784.0)   . GERD (gastroesophageal reflux disease)   . Hyperlipidemia     Past Surgical History  Procedure Laterality Date  . Abdominal hysterectomy      Cervical cancer, with incidental appendectomy  . Colonoscopy  09/22/2003    RMR: Diminutive rectal polyps cold biopsy/removed.  Otherwise normal rectal mucosa/Pancolonic diverticula.  The remainder of the colonic mucosa appeared normal  . Esophagogastroduodenoscopy  11/19/2006    [id number] Schatzki's ring with overlying distal esophageal erosions consistent with erosive reflux esophagitis, status post dilation and disruption of ring as described above/ Small hiatal hernia/Otherwise normal stomach, first duodenum and second duodenum  . Bladder tack    . Breast lumpectomy      benign  . Colonoscopy with esophagogastroduodenoscopy (egd) N/A 02/04/2013    Procedure: COLONOSCOPY WITH ESOPHAGOGASTRODUODENOSCOPY (EGD);  Surgeon: Corbin Ade, MD;  Location: [facility];  Service: Endoscopy;  Laterality: N/A;  10:15-moved to 9:30am  . Esophageal biopsy N/A 02/04/2013    Procedure: BIOPSY;  Surgeon: Corbin Ade, MD;   Location: [facility];  Service: Endoscopy;  Laterality: N/A;  SB BX AND RANDOM COLON BX  . Eus N/A 02/12/2013    Procedure: UPPER ENDOSCOPIC ULTRASOUND (EUS) LINEAR;  Surgeon: Rachael Fee, MD;  Location: [facility] ENDOSCOPY;  Service: Endoscopy;  Laterality: N/A;    Current Outpatient Prescriptions  Medication Sig Dispense Refill  . ALPRAZolam (XANAX) 0.5 MG tablet Take 0.5 mg by mouth 2 (two) times daily as needed. For anxiety      . amphetamine-dextroamphetamine (ADDERALL) 10 MG tablet Take 10 mg by mouth daily.      . Armodafinil (NUVIGIL) 250 MG tablet Take 250 mg by mouth daily.       . cetirizine (ZYRTEC) 10 MG tablet Take 10 mg by mouth daily.      . chlorthalidone (HYGROTON) 25 MG tablet Take 25 mg by mouth daily.       Marland Kitchen dexlansoprazole (DEXILANT) 60 MG capsule Take 60 mg by mouth daily.      Marland Kitchen dicyclomine (BENTYL) 10 MG capsule Take 1 capsule (10 mg total) by mouth 4 (four) times daily -  before meals and at bedtime. As needed for diarrhea  90 capsule  0  . gabapentin (NEURONTIN) 300 MG capsule Take 300 mg by mouth 3 (three) times daily.      Marland Kitchen glipiZIDE (GLUCOTROL XL) 5 MG 24 hr tablet Take 5 mg by mouth daily.      . Lactobacillus (PROBIOTIC ACIDOPHILUS PO) Take 1 capsule by mouth daily.       Marland Kitchen linagliptin (TRADJENTA) 5 MG TABS tablet Take 5 mg  by mouth daily.      Marland Kitchen losartan (COZAAR) 100 MG tablet Take 100 mg by mouth daily.        . Multiple Vitamins-Minerals (MULTIVITAMINS THER. W/MINERALS) TABS Take 1 tablet by mouth daily.        . Nutritional Supplements (DHEA PO) Take 1 tablet by mouth daily.        . polyethylene glycol-electrolytes (TRILYTE) 420 G solution Take 4,000 mLs by mouth as directed.  4000 mL  0  . verapamil (CALAN-SR) 240 MG CR tablet Take 240 mg by mouth daily.       . Vitamin D, Ergocalciferol, (DRISDOL) 50000 UNITS CAPS Take 50,000 Units by mouth every 7 (seven) days.         No current facility-administered medications for this visit.     Allergies   Allergen Reactions  . Other     Metal alloy-breaks her out-esp. Metal surgical instruments or some metal implanted in me  . Asa [Aspirin]     Kidney doctor told her to not take ASA  . Erythromycin   . Famciclovir     migrane  . Hydrocodone     "makes me lose my mind"-hallucinations  . Ibuprofen     Kidney doctor told her to not take ibuprofen.  . Oxycodone   . Promethazine Nausea And Vomiting  . Tylenol [Acetaminophen]     Knocks her out.     Family History  Problem Relation Age of Onset  . Colon cancer Neg Hx   . Cancer Mother     ?ovarian  . Cancer Father      History   Social History  . Marital Status: Married    Spouse Name: N/A    Number of Children: N/A  . Years of Education: N/A   Social History Main Topics  . Smoking status: Former Games developer  . Smokeless tobacco: Former Neurosurgeon    Quit date: 02/11/1970  . Alcohol Use: No  . Drug Use: No  . Sexual Activity: None    REVIEW OF SYSTEMS - PERTINENT POSITIVES ONLY: 12 point review of systems negative other than HPI and PMH except for trouble swallowing, abdominal pain, diarrhea, nausea, weakness.    EXAM: Filed Vitals:   02/11/13 1520  BP: 150/88  Pulse: 98  Temp: 97.3 F (36.3 C)   Filed Weights   02/11/13 1520  Weight: 170 lb 12.8 oz (77.474 kg)     Gen:  No acute distress.  Well nourished and well groomed.   Neurological: Alert and oriented to person, place, and time. Coordination normal.  Head: Normocephalic and atraumatic.  Eyes: Conjunctivae are normal. Pupils are equal, round, and reactive to light. No scleral icterus.  Neck: Normal range of motion. Neck supple. No tracheal deviation or thyromegaly present.  Cardiovascular: Normal rate, regular rhythm, normal heart sounds and intact distal pulses.  Exam reveals no gallop and no friction rub.  No murmur heard. Respiratory: Effort normal.  No respiratory distress. No chest wall tenderness. Breath sounds normal.  No wheezes, rales or rhonchi.   GI: Soft. Bowel sounds are normal. The abdomen is soft and nontender.  There is no rebound and no guarding.  Musculoskeletal: Normal range of motion. Extremities are nontender.  Lymphadenopathy: No cervical, preauricular, postauricular or axillary adenopathy is present Skin: Skin is warm and dry. No rash noted. No diaphoresis. No erythema. No pallor. No clubbing, cyanosis, or edema.   Psychiatric: Normal mood and affect. Behavior is normal. Judgment and thought content normal.  LABORATORY RESULTS: Available labs are reviewed  CBC, CMET normal in July 2014.    RADIOLOGY RESULTS: See E-Chart or I-Site for most recent results.  Images and reports are reviewed.  Ct 09/2012 IMPRESSION:  1. No acute abnormality or finding to explain the patient's  symptoms.  2. Fatty infiltration of the liver.  3. Mild diverticulosis without diverticulitis.  4. Multilevel degenerative disc disease appearing worst at L4-5.    ASSESSMENT AND PLAN: Malignant carcinoid tumor of duodenum Patient appears to have a carcinoid tumor in the duodenal bulb. She is undergoing endoscopic ultrasound with Dr. Christella Hartigan, however this will likely just need to be surgically resected. We will make an attempt to do this laparoscopically. However, I advised the patient is unlikely that we will be successful based on the orientation of the mass. We will likely need to have exploratory laparotomy to decrease the risk of leaking.  She will have the EUS and we will plan surgery following that.  I will likely need to see her back prior to surgery.  30 min spent in evaluation, examination, coordination of care, and counseling.  >50% spent in counseling.       Maudry Diego MD Surgical Oncology, General and Endocrine Surgery [facility]      Visit Diagnoses: 1. Malignant carcinoid tumor of duodenum     Primary Care Physician: Catalina Pizza, MD  GI Raiford Simmonds and Christella Hartigan.

## 2013-02-18 ENCOUNTER — Ambulatory Visit (INDEPENDENT_AMBULATORY_CARE_PROVIDER_SITE_OTHER): Payer: Self-pay | Admitting: General Surgery

## 2013-02-18 DIAGNOSIS — Z23 Encounter for immunization: Secondary | ICD-10-CM | POA: Diagnosis not present

## 2013-02-18 DIAGNOSIS — R197 Diarrhea, unspecified: Secondary | ICD-10-CM | POA: Diagnosis not present

## 2013-02-19 ENCOUNTER — Other Ambulatory Visit (INDEPENDENT_AMBULATORY_CARE_PROVIDER_SITE_OTHER): Payer: Self-pay | Admitting: General Surgery

## 2013-02-26 ENCOUNTER — Other Ambulatory Visit (HOSPITAL_COMMUNITY): Payer: Self-pay | Admitting: Internal Medicine

## 2013-02-26 DIAGNOSIS — Z139 Encounter for screening, unspecified: Secondary | ICD-10-CM

## 2013-03-04 DIAGNOSIS — R197 Diarrhea, unspecified: Secondary | ICD-10-CM | POA: Diagnosis not present

## 2013-03-04 DIAGNOSIS — G4730 Sleep apnea, unspecified: Secondary | ICD-10-CM | POA: Diagnosis not present

## 2013-03-04 DIAGNOSIS — G47419 Narcolepsy without cataplexy: Secondary | ICD-10-CM | POA: Diagnosis not present

## 2013-03-04 DIAGNOSIS — I10 Essential (primary) hypertension: Secondary | ICD-10-CM | POA: Diagnosis not present

## 2013-03-05 ENCOUNTER — Ambulatory Visit (HOSPITAL_COMMUNITY)
Admission: RE | Admit: 2013-03-05 | Discharge: 2013-03-05 | Disposition: A | Discharge status: Home / Self Care | Payer: Medicare Other | Source: Ambulatory Visit | Attending: Internal Medicine | Admitting: Internal Medicine

## 2013-03-05 DIAGNOSIS — Z1231 Encounter for screening mammogram for malignant neoplasm of breast: Secondary | ICD-10-CM | POA: Diagnosis not present

## 2013-03-05 DIAGNOSIS — Z139 Encounter for screening, unspecified: Secondary | ICD-10-CM

## 2013-03-09 DIAGNOSIS — R809 Proteinuria, unspecified: Secondary | ICD-10-CM | POA: Diagnosis not present

## 2013-03-09 DIAGNOSIS — N189 Chronic kidney disease, unspecified: Secondary | ICD-10-CM | POA: Diagnosis not present

## 2013-03-09 DIAGNOSIS — Z79899 Other long term (current) drug therapy: Secondary | ICD-10-CM | POA: Diagnosis not present

## 2013-03-09 DIAGNOSIS — E559 Vitamin D deficiency, unspecified: Secondary | ICD-10-CM | POA: Diagnosis not present

## 2013-03-09 DIAGNOSIS — D649 Anemia, unspecified: Secondary | ICD-10-CM | POA: Diagnosis not present

## 2013-03-09 DIAGNOSIS — E1129 Type 2 diabetes mellitus with other diabetic kidney complication: Secondary | ICD-10-CM | POA: Diagnosis not present

## 2013-03-11 DIAGNOSIS — E119 Type 2 diabetes mellitus without complications: Secondary | ICD-10-CM | POA: Diagnosis not present

## 2013-03-11 DIAGNOSIS — E559 Vitamin D deficiency, unspecified: Secondary | ICD-10-CM | POA: Diagnosis not present

## 2013-03-11 DIAGNOSIS — I10 Essential (primary) hypertension: Secondary | ICD-10-CM | POA: Diagnosis not present

## 2013-03-16 DIAGNOSIS — R197 Diarrhea, unspecified: Secondary | ICD-10-CM | POA: Diagnosis not present

## 2013-03-16 DIAGNOSIS — C7A010 Malignant carcinoid tumor of the duodenum: Secondary | ICD-10-CM | POA: Diagnosis not present

## 2013-03-16 DIAGNOSIS — E119 Type 2 diabetes mellitus without complications: Secondary | ICD-10-CM | POA: Diagnosis not present

## 2013-03-16 DIAGNOSIS — E782 Mixed hyperlipidemia: Secondary | ICD-10-CM | POA: Diagnosis not present

## 2013-03-25 ENCOUNTER — Telehealth (INDEPENDENT_AMBULATORY_CARE_PROVIDER_SITE_OTHER): Payer: Self-pay

## 2013-03-25 NOTE — Telephone Encounter (Signed)
Pt called wanting to be sure that Dr Donell Beers and her assistant are aware that the pt has multiple metal allergies. She cannot tell me what metals except nickel but states she is also allergic to metal in surgical instruments. I asked pt if she was advised of this by a surgeon or worked up for this allergy. Pt states"I just know because it takes me a lot longer to get over a surgery than it does other people". I advised the pt I will sent this msg to Dr Donell Beers and her assistant so they will be aware.

## 2013-03-26 ENCOUNTER — Other Ambulatory Visit (HOSPITAL_COMMUNITY): Payer: Self-pay | Admitting: General Surgery

## 2013-03-26 NOTE — Progress Notes (Signed)
CT abd/pelvis 5/14, sleep study 6/14 EPIC

## 2013-03-26 NOTE — Patient Instructions (Addendum)
20 Tiffany Ortiz  03/26/2013   Your procedure is scheduled on:  03/31/13  TUESDAY  Report to [facility] at   0830    AM.  Call this number if you have problems the morning of surgery: [phone]     BRING CPAP MASK AND TUBING WITH YOU TO HOSPITAL  Remember: BOWEL PREP AS PER OFFICE-DRINK CLEAR LIQUIDS THE DAY BEFORE SURGERY 03/30/2013 ALL DAY TIL MIDNIGHT!  Do not TAKE ANYTHING BY MOUTH :After Midnight. Monday NIGHT   Take these medicines the morning of surgery with A SIP OF WATER:Neurontin,Verapamil,Dexilant,Xanax,Prozac if needed DO NOT TAKE ANY DIABETES MEDICINE MORNING OF SURGERY  .  Contacts, dentures or partial plates can not be worn to surgery  Leave suitcase in the car. After surgery it may be brought to your room.  For patients admitted to the hospital, checkout time is 11:00 AM day of  discharge.             SPECIAL INSTRUCTIONS- SEE [facility] PREPARING FOR SURGERY INSTRUCTION SHEET-     DO NOT WEAR JEWELRY, LOTIONS, POWDERS, OR PERFUMES.  WOMEN-- DO NOT SHAVE LEGS OR UNDERARMS FOR 12 HOURS BEFORE SHOWERS. MEN MAY SHAVE FACE.  Patients discharged the day of surgery will not be allowed to drive home. IF going home the day of surgery, you must have a driver and someone to stay with you for the first 24 hours  Name and phone number of your driver:                                                                        Please read over the following fact sheets that you were given: Blood Transfusion Sheet  Information                                                                                   [name],RN,BSN  PST 336  [id number]                 FAILURE TO FOLLOW THESE INSTRUCTIONS MAY RESULT IN  CANCELLATION   OF YOUR SURGERY                                                  Patient Signature _____________________________

## 2013-03-27 ENCOUNTER — Encounter (HOSPITAL_COMMUNITY): Payer: Self-pay

## 2013-03-27 ENCOUNTER — Encounter (HOSPITAL_COMMUNITY): Payer: Self-pay | Admitting: Pharmacy Technician

## 2013-03-27 ENCOUNTER — Encounter (HOSPITAL_COMMUNITY)
Admission: RE | Admit: 2013-03-27 | Discharge: 2013-03-27 | Disposition: A | Discharge status: Home / Self Care | Payer: Medicare Other | Source: Ambulatory Visit | Attending: General Surgery | Admitting: General Surgery

## 2013-03-27 ENCOUNTER — Telehealth (INDEPENDENT_AMBULATORY_CARE_PROVIDER_SITE_OTHER): Payer: Self-pay | Admitting: *Deleted

## 2013-03-27 DIAGNOSIS — Z01812 Encounter for preprocedural laboratory examination: Secondary | ICD-10-CM | POA: Diagnosis not present

## 2013-03-27 DIAGNOSIS — Z01810 Encounter for preprocedural cardiovascular examination: Secondary | ICD-10-CM | POA: Diagnosis not present

## 2013-03-27 DIAGNOSIS — Z01818 Encounter for other preprocedural examination: Secondary | ICD-10-CM | POA: Diagnosis not present

## 2013-03-27 HISTORY — DX: Depression, unspecified: F32.A

## 2013-03-27 HISTORY — DX: Anxiety disorder, unspecified: F41.9

## 2013-03-27 HISTORY — DX: Major depressive disorder, single episode, unspecified: F32.9

## 2013-03-27 HISTORY — DX: Shortness of breath: R06.02

## 2013-03-27 LAB — BASIC METABOLIC PANEL
BUN: 20 mg/dL (ref 6–23)
Calcium: 9.9 mg/dL (ref 8.4–10.5)
GFR calc Af Amer: 80 mL/min — ABNORMAL LOW (ref >90)
GFR calc non Af Amer: 69 mL/min — ABNORMAL LOW (ref >90)
Glucose, Bld: 116 mg/dL — ABNORMAL HIGH (ref 70–99)
Potassium: 3.2 mEq/L — ABNORMAL LOW (ref 3.5–5.1)
Sodium: 137 mEq/L (ref 135–145)

## 2013-03-27 LAB — CBC
Hemoglobin: 14.3 g/dL (ref 12.0–15.0)
MCH: 29.9 pg (ref 26.0–34.0)
MCHC: 33.6 g/dL (ref 30.0–36.0)
MCV: 88.7 fL (ref 78.0–100.0)
Platelets: 355 10*3/uL (ref 150–400)

## 2013-03-27 LAB — ABO/RH: ABO/RH(D): A POS

## 2013-03-27 NOTE — Telephone Encounter (Signed)
Beverly with Pre-Admit called to ask if patient needed to do a bowel prep.  Spoke to [place] who spoke to Dr. Donell Beers who states no bowel prep necessary.  Called back and updated Beverly at this time who still has patient there with her and she will update patient.

## 2013-03-31 ENCOUNTER — Encounter (HOSPITAL_COMMUNITY)
Admission: RE | Disposition: A | Discharge status: Home / Self Care | Payer: Self-pay | Source: Ambulatory Visit | Attending: General Surgery

## 2013-03-31 ENCOUNTER — Inpatient Hospital Stay (HOSPITAL_COMMUNITY)
Admission: RE | Admit: 2013-03-31 | Discharge: 2013-04-06 | DRG: 327 | Disposition: A | Discharge status: Home / Self Care | Payer: Medicare Other | Source: Ambulatory Visit | Attending: General Surgery | Admitting: General Surgery

## 2013-03-31 ENCOUNTER — Inpatient Hospital Stay (HOSPITAL_COMMUNITY): Payer: Medicare Other | Admitting: Certified Registered Nurse Anesthetist

## 2013-03-31 ENCOUNTER — Encounter (HOSPITAL_COMMUNITY): Payer: Medicare Other | Admitting: Certified Registered Nurse Anesthetist

## 2013-03-31 ENCOUNTER — Encounter (HOSPITAL_COMMUNITY): Payer: Self-pay

## 2013-03-31 ENCOUNTER — Inpatient Hospital Stay (HOSPITAL_COMMUNITY): Payer: Medicare Other

## 2013-03-31 DIAGNOSIS — R109 Unspecified abdominal pain: Secondary | ICD-10-CM | POA: Diagnosis not present

## 2013-03-31 DIAGNOSIS — E669 Obesity, unspecified: Secondary | ICD-10-CM | POA: Diagnosis present

## 2013-03-31 DIAGNOSIS — F411 Generalized anxiety disorder: Secondary | ICD-10-CM | POA: Diagnosis present

## 2013-03-31 DIAGNOSIS — Z6829 Body mass index (BMI) 29.0-29.9, adult: Secondary | ICD-10-CM

## 2013-03-31 DIAGNOSIS — I10 Essential (primary) hypertension: Secondary | ICD-10-CM | POA: Diagnosis not present

## 2013-03-31 DIAGNOSIS — Z5331 Laparoscopic surgical procedure converted to open procedure: Secondary | ICD-10-CM

## 2013-03-31 DIAGNOSIS — G4730 Sleep apnea, unspecified: Secondary | ICD-10-CM | POA: Diagnosis present

## 2013-03-31 DIAGNOSIS — Z01812 Encounter for preprocedural laboratory examination: Secondary | ICD-10-CM

## 2013-03-31 DIAGNOSIS — N289 Disorder of kidney and ureter, unspecified: Secondary | ICD-10-CM | POA: Diagnosis present

## 2013-03-31 DIAGNOSIS — K219 Gastro-esophageal reflux disease without esophagitis: Secondary | ICD-10-CM | POA: Diagnosis present

## 2013-03-31 DIAGNOSIS — D3A098 Benign carcinoid tumors of other sites: Secondary | ICD-10-CM | POA: Diagnosis present

## 2013-03-31 DIAGNOSIS — K589 Irritable bowel syndrome without diarrhea: Secondary | ICD-10-CM | POA: Diagnosis present

## 2013-03-31 DIAGNOSIS — IMO0001 Reserved for inherently not codable concepts without codable children: Secondary | ICD-10-CM | POA: Diagnosis not present

## 2013-03-31 DIAGNOSIS — E119 Type 2 diabetes mellitus without complications: Secondary | ICD-10-CM | POA: Diagnosis present

## 2013-03-31 DIAGNOSIS — Z8541 Personal history of malignant neoplasm of cervix uteri: Secondary | ICD-10-CM | POA: Diagnosis not present

## 2013-03-31 DIAGNOSIS — Z87891 Personal history of nicotine dependence: Secondary | ICD-10-CM | POA: Diagnosis not present

## 2013-03-31 DIAGNOSIS — E559 Vitamin D deficiency, unspecified: Secondary | ICD-10-CM | POA: Diagnosis present

## 2013-03-31 DIAGNOSIS — Z01810 Encounter for preprocedural cardiovascular examination: Secondary | ICD-10-CM | POA: Diagnosis not present

## 2013-03-31 DIAGNOSIS — Z886 Allergy status to analgesic agent status: Secondary | ICD-10-CM

## 2013-03-31 DIAGNOSIS — C7B8 Other secondary neuroendocrine tumors: Secondary | ICD-10-CM | POA: Diagnosis not present

## 2013-03-31 DIAGNOSIS — Z79899 Other long term (current) drug therapy: Secondary | ICD-10-CM

## 2013-03-31 DIAGNOSIS — C7A010 Malignant carcinoid tumor of the duodenum: Principal | ICD-10-CM | POA: Diagnosis present

## 2013-03-31 DIAGNOSIS — E785 Hyperlipidemia, unspecified: Secondary | ICD-10-CM | POA: Diagnosis present

## 2013-03-31 DIAGNOSIS — K319 Disease of stomach and duodenum, unspecified: Secondary | ICD-10-CM | POA: Diagnosis not present

## 2013-03-31 HISTORY — PX: LAPAROSCOPIC SMALL BOWEL RESECTION: SHX5929

## 2013-03-31 LAB — CBC
MCV: 88.3 fL (ref 78.0–100.0)
Platelets: 364 10*3/uL (ref 150–400)
RBC: 4.28 MIL/uL (ref 3.87–5.11)
RDW: 12.9 % (ref 11.5–15.5)
WBC: 20.1 10*3/uL — ABNORMAL HIGH (ref 4.0–10.5)

## 2013-03-31 LAB — CREATININE, SERUM
Creatinine, Ser: 0.8 mg/dL (ref 0.50–1.10)
GFR calc Af Amer: 86 mL/min — ABNORMAL LOW (ref >90)
GFR calc non Af Amer: 75 mL/min — ABNORMAL LOW (ref >90)

## 2013-03-31 LAB — GLUCOSE, CAPILLARY: Glucose-Capillary: 123 mg/dL — ABNORMAL HIGH (ref 70–99)

## 2013-03-31 SURGERY — EXCISION, SMALL INTESTINE, LAPAROSCOPIC
Anesthesia: General | Site: Abdomen | Wound class: Clean Contaminated

## 2013-03-31 MED ORDER — CEFOXITIN SODIUM 1 G IV SOLR
1.0000 g | Freq: Four times a day (QID) | INTRAVENOUS | Status: AC
  Administered 2013-03-31 – 2013-04-01 (×3): 1 g via INTRAVENOUS
  Filled 2013-03-31 (×3): qty 1

## 2013-03-31 MED ORDER — DEXAMETHASONE SODIUM PHOSPHATE 10 MG/ML IJ SOLN
INTRAMUSCULAR | Status: DC | PRN
  Administered 2013-03-31: 10 mg via INTRAVENOUS

## 2013-03-31 MED ORDER — OXYMETAZOLINE HCL 0.05 % NA SOLN
1.0000 | Freq: Two times a day (BID) | NASAL | Status: DC | PRN
  Administered 2013-04-06: 1 via NASAL
  Filled 2013-03-31: qty 15

## 2013-03-31 MED ORDER — DEXTROSE 5 % IV SOLN
INTRAVENOUS | Status: AC
  Filled 2013-03-31: qty 1

## 2013-03-31 MED ORDER — LORAZEPAM BOLUS VIA INFUSION: 0.5000 mg | Freq: Three times a day (TID) | INTRAVENOUS | Status: DC | PRN

## 2013-03-31 MED ORDER — LORAZEPAM 2 MG/ML IJ SOLN
0.5000 mg | Freq: Three times a day (TID) | INTRAMUSCULAR | Status: DC | PRN
  Administered 2013-04-03 – 2013-04-05 (×4): 0.5 mg via INTRAVENOUS
  Filled 2013-03-31 (×4): qty 1

## 2013-03-31 MED ORDER — MIDAZOLAM HCL 2 MG/2ML IJ SOLN
INTRAMUSCULAR | Status: AC
  Filled 2013-03-31: qty 2

## 2013-03-31 MED ORDER — PANTOPRAZOLE SODIUM 40 MG IV SOLR
40.0000 mg | Freq: Every day | INTRAVENOUS | Status: DC
  Administered 2013-03-31 – 2013-04-01 (×2): 40 mg via INTRAVENOUS
  Filled 2013-03-31 (×3): qty 40

## 2013-03-31 MED ORDER — HYDROMORPHONE HCL PF 1 MG/ML IJ SOLN
INTRAMUSCULAR | Status: DC | PRN
  Administered 2013-03-31 (×6): 0.5 mg via INTRAVENOUS

## 2013-03-31 MED ORDER — ONDANSETRON HCL 4 MG/2ML IJ SOLN
INTRAMUSCULAR | Status: DC | PRN
  Administered 2013-03-31: 4 mg via INTRAVENOUS

## 2013-03-31 MED ORDER — FENTANYL CITRATE 0.05 MG/ML IJ SOLN
INTRAMUSCULAR | Status: AC
  Filled 2013-03-31: qty 5

## 2013-03-31 MED ORDER — NEOSTIGMINE METHYLSULFATE 1 MG/ML IJ SOLN
INTRAMUSCULAR | Status: DC | PRN
  Administered 2013-03-31: 4 mg via INTRAVENOUS

## 2013-03-31 MED ORDER — GLYCOPYRROLATE 0.2 MG/ML IJ SOLN
INTRAMUSCULAR | Status: DC | PRN
  Administered 2013-03-31: .7 mg via INTRAVENOUS

## 2013-03-31 MED ORDER — BUPIVACAINE-EPINEPHRINE 0.25% -1:200000 IJ SOLN
INTRAMUSCULAR | Status: DC | PRN
  Administered 2013-03-31: 13 mL

## 2013-03-31 MED ORDER — LACTATED RINGERS IV SOLN
INTRAVENOUS | Status: DC
  Administered 2013-03-31: 15:00:00 via INTRAVENOUS
  Administered 2013-03-31: 1000 mL via INTRAVENOUS

## 2013-03-31 MED ORDER — ROCURONIUM BROMIDE 100 MG/10ML IV SOLN
INTRAVENOUS | Status: AC
  Filled 2013-03-31: qty 1

## 2013-03-31 MED ORDER — FENTANYL CITRATE 0.05 MG/ML IJ SOLN: 25.0000 ug | INTRAMUSCULAR | Status: DC | PRN

## 2013-03-31 MED ORDER — GLYCOPYRROLATE 0.2 MG/ML IJ SOLN
INTRAMUSCULAR | Status: AC
  Filled 2013-03-31: qty 3

## 2013-03-31 MED ORDER — MORPHINE SULFATE (PF) 1 MG/ML IV SOLN
INTRAVENOUS | Status: AC
  Filled 2013-03-31: qty 25

## 2013-03-31 MED ORDER — LIDOCAINE HCL (CARDIAC) 20 MG/ML IV SOLN
INTRAVENOUS | Status: AC
  Filled 2013-03-31: qty 5

## 2013-03-31 MED ORDER — SODIUM CHLORIDE 0.9 % IJ SOLN: 9.0000 mL | INTRAMUSCULAR | Status: DC | PRN

## 2013-03-31 MED ORDER — BUPIVACAINE 0.25 % ON-Q PUMP DUAL CATH 300 ML
300.0000 mL | INJECTION | Status: DC
  Administered 2013-03-31: 300 mL
  Filled 2013-03-31: qty 300

## 2013-03-31 MED ORDER — LIDOCAINE HCL 1 % IJ SOLN
INTRAMUSCULAR | Status: DC | PRN
  Administered 2013-03-31: 13 mL

## 2013-03-31 MED ORDER — ROCURONIUM BROMIDE 100 MG/10ML IV SOLN
INTRAVENOUS | Status: DC | PRN
  Administered 2013-03-31 (×2): 10 mg via INTRAVENOUS
  Administered 2013-03-31: 40 mg via INTRAVENOUS

## 2013-03-31 MED ORDER — ONDANSETRON HCL 4 MG/2ML IJ SOLN
INTRAMUSCULAR | Status: AC
  Filled 2013-03-31: qty 2

## 2013-03-31 MED ORDER — PROPOFOL 10 MG/ML IV BOLUS
INTRAVENOUS | Status: AC
  Filled 2013-03-31: qty 20

## 2013-03-31 MED ORDER — MIDAZOLAM HCL 2 MG/2ML IJ SOLN
INTRAMUSCULAR | Status: AC
Start: 2013-03-31 — End: 2013-03-31
  Filled 2013-03-31: qty 2

## 2013-03-31 MED ORDER — NEOSTIGMINE METHYLSULFATE 1 MG/ML IJ SOLN
INTRAMUSCULAR | Status: AC
  Filled 2013-03-31: qty 10

## 2013-03-31 MED ORDER — ONDANSETRON HCL 4 MG/2ML IJ SOLN: 4.0000 mg | Freq: Four times a day (QID) | INTRAMUSCULAR | Status: DC | PRN

## 2013-03-31 MED ORDER — MORPHINE SULFATE (PF) 1 MG/ML IV SOLN
INTRAVENOUS | Status: DC
  Administered 2013-03-31: 20:00:00 via INTRAVENOUS
  Administered 2013-04-01: 1.5 mg via INTRAVENOUS
  Administered 2013-04-01: 3 mg via INTRAVENOUS
  Administered 2013-04-01: 1.5 mg via INTRAVENOUS
  Administered 2013-04-01: 3 mg via INTRAVENOUS
  Administered 2013-04-02 (×5): 1.5 mg via INTRAVENOUS
  Administered 2013-04-02: 6 mg via INTRAVENOUS
  Administered 2013-04-03: 3 mg via INTRAVENOUS
  Administered 2013-04-03 (×2): 1.5 mg via INTRAVENOUS
  Filled 2013-03-31: qty 25

## 2013-03-31 MED ORDER — HYDROMORPHONE HCL PF 2 MG/ML IJ SOLN
INTRAMUSCULAR | Status: AC
  Filled 2013-03-31: qty 1

## 2013-03-31 MED ORDER — DEXTROSE 5 % IV SOLN
2.0000 g | INTRAVENOUS | Status: AC
  Administered 2013-03-31 (×2): 2 g via INTRAVENOUS
  Filled 2013-03-31: qty 2

## 2013-03-31 MED ORDER — BUPIVACAINE ON-Q PAIN PUMP (FOR ORDER SET NO CHG)
INJECTION | Status: DC
  Filled 2013-03-31: qty 1

## 2013-03-31 MED ORDER — SUCCINYLCHOLINE CHLORIDE 20 MG/ML IJ SOLN
INTRAMUSCULAR | Status: AC
  Filled 2013-03-31: qty 1

## 2013-03-31 MED ORDER — NALOXONE HCL 0.4 MG/ML IJ SOLN: 0.4000 mg | INTRAMUSCULAR | Status: DC | PRN

## 2013-03-31 MED ORDER — DEXAMETHASONE SODIUM PHOSPHATE 10 MG/ML IJ SOLN
INTRAMUSCULAR | Status: AC
  Filled 2013-03-31: qty 1

## 2013-03-31 MED ORDER — PROPOFOL 10 MG/ML IV BOLUS
INTRAVENOUS | Status: DC | PRN
  Administered 2013-03-31: 160 mg via INTRAVENOUS

## 2013-03-31 MED ORDER — HYDRALAZINE HCL 20 MG/ML IJ SOLN: 20.0000 mg | INTRAMUSCULAR | Status: DC | PRN

## 2013-03-31 MED ORDER — BUPIVACAINE-EPINEPHRINE PF 0.5-1:200000 % IJ SOLN
INTRAMUSCULAR | Status: AC
  Filled 2013-03-31: qty 30

## 2013-03-31 MED ORDER — DIPHENHYDRAMINE HCL 12.5 MG/5ML PO ELIX: 12.5000 mg | ORAL_SOLUTION | Freq: Four times a day (QID) | ORAL | Status: DC | PRN

## 2013-03-31 MED ORDER — DIPHENHYDRAMINE HCL 50 MG/ML IJ SOLN
12.5000 mg | Freq: Four times a day (QID) | INTRAMUSCULAR | Status: DC | PRN
  Administered 2013-04-02: 12.5 mg via INTRAVENOUS
  Filled 2013-03-31: qty 1

## 2013-03-31 MED ORDER — INSULIN ASPART 100 UNIT/ML ~~LOC~~ SOLN
0.0000 [IU] | SUBCUTANEOUS | Status: DC
  Administered 2013-04-01: 7 [IU] via SUBCUTANEOUS
  Administered 2013-04-01: 4 [IU] via SUBCUTANEOUS
  Administered 2013-04-01: 11 [IU] via SUBCUTANEOUS
  Administered 2013-04-01: 3 [IU] via SUBCUTANEOUS
  Administered 2013-04-01: 11 [IU] via SUBCUTANEOUS
  Administered 2013-04-02: 3 [IU] via SUBCUTANEOUS
  Administered 2013-04-02: 4 [IU] via SUBCUTANEOUS
  Administered 2013-04-02 – 2013-04-03 (×6): 3 [IU] via SUBCUTANEOUS
  Administered 2013-04-03: 4 [IU] via SUBCUTANEOUS
  Administered 2013-04-03 – 2013-04-04 (×2): 3 [IU] via SUBCUTANEOUS
  Administered 2013-04-04: 4 [IU] via SUBCUTANEOUS
  Administered 2013-04-04 – 2013-04-05 (×5): 3 [IU] via SUBCUTANEOUS
  Administered 2013-04-05: 4 [IU] via SUBCUTANEOUS
  Administered 2013-04-05: 3 [IU] via SUBCUTANEOUS

## 2013-03-31 MED ORDER — ONDANSETRON HCL 4 MG PO TABS: 4.0000 mg | ORAL_TABLET | Freq: Four times a day (QID) | ORAL | Status: DC | PRN

## 2013-03-31 MED ORDER — KCL IN DEXTROSE-NACL 20-5-0.45 MEQ/L-%-% IV SOLN
INTRAVENOUS | Status: DC
  Administered 2013-03-31: 23:00:00 via INTRAVENOUS
  Administered 2013-04-02: 100 mL/h via INTRAVENOUS
  Administered 2013-04-02 – 2013-04-03 (×3): via INTRAVENOUS
  Filled 2013-03-31 (×10): qty 1000

## 2013-03-31 MED ORDER — FAMOTIDINE IN NACL 20-0.9 MG/50ML-% IV SOLN
20.0000 mg | Freq: Once | INTRAVENOUS | Status: AC
  Administered 2013-03-31: 20 mg via INTRAVENOUS
  Filled 2013-03-31: qty 50

## 2013-03-31 MED ORDER — MEPERIDINE HCL 50 MG/ML IJ SOLN: 6.2500 mg | INTRAMUSCULAR | Status: DC | PRN

## 2013-03-31 MED ORDER — 0.9 % SODIUM CHLORIDE (POUR BTL) OPTIME
TOPICAL | Status: DC | PRN
  Administered 2013-03-31: 3000 mL

## 2013-03-31 MED ORDER — HEPARIN SODIUM (PORCINE) 5000 UNIT/ML IJ SOLN
5000.0000 [IU] | Freq: Three times a day (TID) | INTRAMUSCULAR | Status: DC
  Administered 2013-04-01 – 2013-04-06 (×16): 5000 [IU] via SUBCUTANEOUS
  Filled 2013-03-31 (×20): qty 1

## 2013-03-31 MED ORDER — FENTANYL CITRATE 0.05 MG/ML IJ SOLN
INTRAMUSCULAR | Status: DC | PRN
  Administered 2013-03-31: 25 ug via INTRAVENOUS
  Administered 2013-03-31: 100 ug via INTRAVENOUS
  Administered 2013-03-31: 75 ug via INTRAVENOUS
  Administered 2013-03-31: 50 ug via INTRAVENOUS

## 2013-03-31 MED ORDER — LIDOCAINE HCL 1 % IJ SOLN
INTRAMUSCULAR | Status: AC
  Filled 2013-03-31: qty 20

## 2013-03-31 MED ORDER — SUCCINYLCHOLINE CHLORIDE 20 MG/ML IJ SOLN
INTRAMUSCULAR | Status: DC | PRN
  Administered 2013-03-31: 100 mg via INTRAVENOUS

## 2013-03-31 MED ORDER — LIDOCAINE HCL (CARDIAC) 20 MG/ML IV SOLN
INTRAVENOUS | Status: DC | PRN
  Administered 2013-03-31: 100 mg via INTRAVENOUS

## 2013-03-31 MED ORDER — METOPROLOL TARTRATE 1 MG/ML IV SOLN
5.0000 mg | Freq: Four times a day (QID) | INTRAVENOUS | Status: DC
  Administered 2013-03-31 – 2013-04-03 (×9): 5 mg via INTRAVENOUS
  Filled 2013-03-31 (×14): qty 5

## 2013-03-31 SURGICAL SUPPLY — 102 items
APPLIER CLIP 5 13 M/L LIGAMAX5 (MISCELLANEOUS)
APPLIER CLIP ROT 10 11.4 M/L (STAPLE)
BENZOIN TINCTURE PRP APPL 2/3 (GAUZE/BANDAGES/DRESSINGS) ×2 IMPLANT
BLADE EXTENDED COATED 6.5IN (ELECTRODE) ×2 IMPLANT
BLADE HEX COATED 2.75 (ELECTRODE) ×2 IMPLANT
BLADE SURG SZ10 CARB STEEL (BLADE) ×2 IMPLANT
CABLE HIGH FREQUENCY MONO STRZ (ELECTRODE) ×2 IMPLANT
CANISTER SUCTION 2500CC (MISCELLANEOUS) IMPLANT
CATH KIT ON Q 7.5IN SLV (PAIN MANAGEMENT) IMPLANT
CATH KIT ON-Q SILVERSOAK 5IN (CATHETERS) ×4 IMPLANT
CELLS DAT CNTRL 66122 CELL SVR (MISCELLANEOUS) IMPLANT
CLIP APPLIE 5 13 M/L LIGAMAX5 (MISCELLANEOUS) IMPLANT
CLIP APPLIE ROT 10 11.4 M/L (STAPLE) IMPLANT
COUNTER NEEDLE 20 DBL MAG RED (NEEDLE) IMPLANT
COVER MAYO STAND STRL (DRAPES) ×4 IMPLANT
DECANTER SPIKE VIAL GLASS SM (MISCELLANEOUS) IMPLANT
DRAIN CHANNEL 19F RND (DRAIN) ×2 IMPLANT
DRAPE C-ARM 42X120 X-RAY (DRAPES) ×2 IMPLANT
DRAPE LAPAROSCOPIC ABDOMINAL (DRAPES) ×2 IMPLANT
DRAPE LG THREE QUARTER DISP (DRAPES) IMPLANT
DRAPE UTILITY XL STRL (DRAPES) ×2 IMPLANT
DRAPE WARM FLUID 44X44 (DRAPE) ×2 IMPLANT
DRSG OPSITE POSTOP 4X10 (GAUZE/BANDAGES/DRESSINGS) IMPLANT
DRSG OPSITE POSTOP 4X6 (GAUZE/BANDAGES/DRESSINGS) IMPLANT
DRSG OPSITE POSTOP 4X8 (GAUZE/BANDAGES/DRESSINGS) ×4 IMPLANT
DRSG TEGADERM 2-3/8X2-3/4 SM (GAUZE/BANDAGES/DRESSINGS) ×2 IMPLANT
DRSG TEGADERM 4X4.75 (GAUZE/BANDAGES/DRESSINGS) ×2 IMPLANT
ELECT REM PT RETURN 9FT ADLT (ELECTROSURGICAL) ×2
ELECTRODE REM PT RTRN 9FT ADLT (ELECTROSURGICAL) ×1 IMPLANT
ENDOLOOP SUT PDS II  0 18 (SUTURE)
ENDOLOOP SUT PDS II 0 18 (SUTURE) IMPLANT
EVACUATOR DRAINAGE 10X20 100CC (DRAIN) ×1 IMPLANT
EVACUATOR SILICONE 100CC (DRAIN) ×1
GAUZE SPONGE 2X2 8PLY STRL LF (GAUZE/BANDAGES/DRESSINGS) ×1 IMPLANT
GLOVE BIO SURGEON STRL SZ 6 (GLOVE) ×4 IMPLANT
GLOVE BIOGEL PI IND STRL 6 (GLOVE) ×2 IMPLANT
GLOVE BIOGEL PI IND STRL 8 (GLOVE) ×2 IMPLANT
GLOVE BIOGEL PI INDICATOR 6 (GLOVE) ×2
GLOVE BIOGEL PI INDICATOR 8 (GLOVE) ×2
GLOVE ECLIPSE 8.0 STRL XLNG CF (GLOVE) ×4 IMPLANT
GLOVE INDICATOR 6.5 STRL GRN (GLOVE) ×6 IMPLANT
GLOVE SURG SS PI 8.5 STRL IVOR (GLOVE) ×2
GLOVE SURG SS PI 8.5 STRL STRW (GLOVE) ×2 IMPLANT
GOWN BRE IMP PREV XXLGXLNG (GOWN DISPOSABLE) ×4 IMPLANT
GOWN STRL REIN XL XLG (GOWN DISPOSABLE) ×8 IMPLANT
KIT BASIN OR (CUSTOM PROCEDURE TRAY) ×2 IMPLANT
LEGGING LITHOTOMY PAIR STRL (DRAPES) IMPLANT
LUBRICANT JELLY K Y 4OZ (MISCELLANEOUS) IMPLANT
MANIFOLD NEPTUNE II (INSTRUMENTS) ×2 IMPLANT
PENCIL BUTTON HOLSTER BLD 10FT (ELECTRODE) ×4 IMPLANT
RTRCTR WOUND ALEXIS 18CM MED (MISCELLANEOUS)
SCALPEL HARMONIC ACE (MISCELLANEOUS) ×2 IMPLANT
SCISSORS LAP 5X35 DISP (ENDOMECHANICALS) ×2 IMPLANT
SEALER TISSUE G2 CVD JAW 35 (ENDOMECHANICALS) IMPLANT
SEALER TISSUE G2 CVD JAW 45CM (ENDOMECHANICALS)
SEALER TISSUE G2 STRG ARTC 35C (ENDOMECHANICALS) IMPLANT
SET IRRIG TUBING LAPAROSCOPIC (IRRIGATION / IRRIGATOR) ×2 IMPLANT
SLEEVE SURGEON STRL (DRAPES) IMPLANT
SLEEVE XCEL OPT CAN 5 100 (ENDOMECHANICALS) ×4 IMPLANT
SOLUTION ANTI FOG 6CC (MISCELLANEOUS) ×2 IMPLANT
SPONGE DRAIN TRACH 4X4 STRL 2S (GAUZE/BANDAGES/DRESSINGS) ×2 IMPLANT
SPONGE GAUZE 2X2 STER 10/PKG (GAUZE/BANDAGES/DRESSINGS) ×1
SPONGE GAUZE 4X4 12PLY (GAUZE/BANDAGES/DRESSINGS) IMPLANT
SPONGE LAP 18X18 X RAY DECT (DISPOSABLE) ×8 IMPLANT
STAPLER VISISTAT 35W (STAPLE) IMPLANT
STRIP CLOSURE SKIN 1/2X4 (GAUZE/BANDAGES/DRESSINGS) ×2 IMPLANT
SUCTION POOLE TIP (SUCTIONS) ×2 IMPLANT
SUT ETHILON 2 0 PS N (SUTURE) ×2 IMPLANT
SUT MNCRL AB 4-0 PS2 18 (SUTURE) ×6 IMPLANT
SUT PDS AB 1 CTX 36 (SUTURE) IMPLANT
SUT PDS AB 1 TP1 96 (SUTURE) ×8 IMPLANT
SUT PDS AB 3-0 SH 27 (SUTURE) ×8 IMPLANT
SUT PROLENE 2 0 KS (SUTURE) IMPLANT
SUT SILK 2 0 (SUTURE)
SUT SILK 2 0 SH CR/8 (SUTURE) ×2 IMPLANT
SUT SILK 2 0SH CR/8 30 (SUTURE) ×2 IMPLANT
SUT SILK 2-0 18XBRD TIE 12 (SUTURE) IMPLANT
SUT SILK 3 0 (SUTURE) ×1
SUT SILK 3 0 SH 30 (SUTURE) ×12 IMPLANT
SUT SILK 3 0 SH CR/8 (SUTURE) ×2 IMPLANT
SUT SILK 3-0 18XBRD TIE 12 (SUTURE) ×1 IMPLANT
SUT VIC AB 2-0 SH 18 (SUTURE) IMPLANT
SUT VIC AB 3-0 SH 18 (SUTURE) IMPLANT
SUT VIC AB 3-0 SH 8-18 (SUTURE) ×4 IMPLANT
SUT VICRYL 2 0 18  UND BR (SUTURE)
SUT VICRYL 2 0 18 UND BR (SUTURE) IMPLANT
SUT VICRYL 3 0 BR 18  UND (SUTURE)
SUT VICRYL 3 0 BR 18 UND (SUTURE) IMPLANT
SYR BULB IRRIGATION 50ML (SYRINGE) ×2 IMPLANT
SYS LAPSCP GELPORT 120MM (MISCELLANEOUS)
SYSTEM LAPSCP GELPORT 120MM (MISCELLANEOUS) IMPLANT
TOWEL OR 17X26 10 PK STRL BLUE (TOWEL DISPOSABLE) ×4 IMPLANT
TOWEL OR NON WOVEN STRL DISP B (DISPOSABLE) ×2 IMPLANT
TRAY FOLEY CATH 14FRSI W/METER (CATHETERS) ×2 IMPLANT
TRAY LAP CHOLE (CUSTOM PROCEDURE TRAY) ×2 IMPLANT
TROCAR BLADELESS OPT 5 100 (ENDOMECHANICALS) ×2 IMPLANT
TROCAR XCEL BLUNT TIP 100MML (ENDOMECHANICALS) ×2 IMPLANT
TROCAR XCEL NON-BLD 11X100MML (ENDOMECHANICALS) IMPLANT
TUBING CONNECTING 10 (TUBING) IMPLANT
TUBING FILTER THERMOFLATOR (ELECTROSURGICAL) ×2 IMPLANT
TUNNELER SHEATH ON-Q 16GX12 DP (PAIN MANAGEMENT) ×4 IMPLANT
YANKAUER SUCT BULB TIP 10FT TU (MISCELLANEOUS) ×2 IMPLANT

## 2013-03-31 NOTE — Op Note (Signed)
PRE-OPERATIVE DIAGNOSIS: duodenal carcinoid  POST-OPERATIVE DIAGNOSIS:  Same  PROCEDURE:  Procedure(s): Lap converted to open resection of malignant duodenal mass, pyloroplasty  SURGEON:  Surgeon(s): Almond Lint, MD  ASSISTANT: Estelle Grumbles, MD  ANESTHESIA:   general  DRAINS: (19 Fr) Blake drain(s) in the RUQ   LOCAL MEDICATIONS USED:  BUPIVICAINE  and LIDOCAINE   SPECIMEN:  Source of Specimen:  portal lymph node, duodenal carcinoid  DISPOSITION OF SPECIMEN:  PATHOLOGY  COUNTS:  YES  DICTATION: .Dragon Dictation  PLAN OF CARE: Admit to inpatient   PATIENT DISPOSITION:  PACU - hemodynamically stable.   EBL:  300 mL  FINDINGS:   1.5 cm mass on mesenteric side near pancreas just past pylorus.  PROCEDURE:    Patient was identified in the holding area to the operating room she was placed on the operating room table. General endotracheal anesthesia was induced. Right side was propped up, then her abdomen was prepped and draped in sterile fashion. Prior to this, a foley catheter was placed. Timeout was performed according to the surgical safety checklist. When all was correct we continued.  A vertical incision was made in the supraumbilical umbilical position after administration of local anesthetic. The fascia was elevated with 2 Kocher clamps. The fascia was incised in the midline and the 0 Vicryl suture was used to place a pursestring suture in the fascial incision. The [facility] trocar was introduced into the abdomen pneumoperitoneum was achieved to a pressure of 15 mm of mercury.  The stomach was retracted toward the midline. The harmonic scalpel was used to kocherized the duodenum. Palpation of the duodenum with graspers and did not reveal the location of the carcinoid tumor. Dr. Michaell Cowing performed an EGD on the patient. The tumor was found to be just distal to the pylorus and on the mesenteric side. The decision was made to open as this would not be safe to resect and repair  laparoscopically.  A midline incision was made encompassing the two 5 mm trocars.  The duodenum was kocherized smaller ones open with the cautery. A large lymph node was found in the porta and this was taken with a harmonic scalpel. A small vein started bleeding and this was suture ligated with 2-0 silk suture.  The right colon was taken down some from the midline. The hepatic flexure was fully retracted. One of the mesenteric veins started bleeding with retraction and this was suture ligated.  Once this was addressed, the lesion was palpated and a pyloromyotomy was made. The mass was taken with a harmonic scalpel as a small disc of duodenum. The defect was directly over the pancreatic head. This was closed with 3-0 PDS. The anterior portion was reinforced with silk. The pyloromyotomy was then closed with running 3-0 PDS in a Connell fashion.  Silks were placed on the either side of the pyloroplasty, and an omental patch was placed over this.  A 19 [place] Blake drain was placed in the right upper quadrant near the suture repair. The On-Q tunnelers were placed on either side of the incision in the pre-peritoneal space. The lap count was correct. The abdomen was irrigated. The fascia was closed using running #1 loop PDS sutures. The skin was then closed using 3-0 Vicryl deep dermal interrupted sutures and 4-0 Monocryl running subcuticular suture. The On-Q catheters were then placed in the tunneler sheath and one of them did not thread well. When pulling out the tunneler sheath out, it snapped off in the abdominal wall. The incision  was reopened and this was located in the subcutaneous space.      The incision was reclosed using #1 loop PDS sutures. The wound was reclosed as using 3-0 Vicryl and 4-0 Monocryl. The pursestring suture was closed at the umbilical incision and no residual palpable fascial defect was present. The skin at this location was closed as well with Vicryl. The entrance site for the left On-Q  had to be extended in order to assist with locating the catheter.    Wounds were dressed with dry sterile dressings.  Needle, sponge, and instrument counts were correct x 2.  The patient was taken to pacu in stable condition.

## 2013-03-31 NOTE — Anesthesia Preprocedure Evaluation (Addendum)
Anesthesia Evaluation  Patient identified by MRN, date of birth, ID band Patient awake    Reviewed: Allergy & Precautions, H&P , NPO status , Patient's Chart, lab work & pertinent test results  Airway Mallampati: II TM Distance: >3 FB Neck ROM: full  Mouth opening: Limited Mouth Opening  Dental no notable dental hx. (+) Dental Advisory Given   Pulmonary shortness of breath and with exertion, sleep apnea and Continuous Positive Airway Pressure Ventilation , former smoker,  breath sounds clear to auscultation  Pulmonary exam normal       Cardiovascular Exercise Tolerance: Good hypertension, Pt. on medications Rhythm:regular Rate:Normal     Neuro/Psych  Headaches, PSYCHIATRIC DISORDERS Anxiety Depression  Neuromuscular disease    GI/Hepatic Neg liver ROS, GERD-  Medicated,  Endo/Other  diabetes, Well Controlled, Type 2, Oral Hypoglycemic Agents  Renal/GU Renal disease     Musculoskeletal  (+) Fibromyalgia -  Abdominal (+) + obese,   Peds  Hematology negative hematology ROS (+)   Anesthesia Other Findings   Reproductive/Obstetrics negative OB ROS                         Anesthesia Physical  Anesthesia Plan  ASA: III  Anesthesia Plan: General   Post-op Pain Management:    Induction: Intravenous and Rapid sequence  Airway Management Planned: Oral ETT  Additional Equipment:   Intra-op Plan:   Post-operative Plan: Extubation in OR  Informed Consent: I have reviewed the patients History and Physical, chart, labs and discussed the procedure including the risks, benefits and alternatives for the proposed anesthesia with the patient or authorized representative who has indicated his/her understanding and acceptance.   Dental advisory given  Plan Discussed with: CRNA  Anesthesia Plan Comments:       Anesthesia Quick Evaluation

## 2013-03-31 NOTE — H&P (Signed)
Progress Notes    Chief Complaint   Patient presents with   .  New Evaluation       eval tumor    HISTORY: Patient is a 67 year old female who is referred by Dr. Raiford Simmonds for a duodenal carcinoid tumor. She underwent upper and lower endoscopy because of chronic diarrhea and dysphagia. She had benign findings on her colonoscopy. In her duodenal bulb, she was noted to have a low-grade neuroendocrine tumor. This appeared to be approximately a centimeter in diameter. Her principal complaint is this chronic diarrhea. She denies significant weight loss. She does have fibromyalgia and is diabetic. She has a prior diagnosis of irritable bowel syndrome.  She has not had any changes in her medical history since I last saw her.      Past Medical History   Diagnosis  Date   .  Fibromyalgia     .  Diabetes mellitus     .  Renal disorder     .  Hypertension     .  IBS (irritable bowel syndrome)     .  Vitamin D deficiency     .  Sleep apnea     .  Headache(784.0)     .  GERD (gastroesophageal reflux disease)     .  Hyperlipidemia        Past Surgical History   Procedure  Laterality  Date   .  Abdominal hysterectomy           Cervical cancer, with incidental appendectomy   .  Colonoscopy    09/22/2003       RMR: Diminutive rectal polyps cold biopsy/removed.  Otherwise normal rectal mucosa/Pancolonic diverticula.  The remainder of the colonic mucosa appeared normal   .  Esophagogastroduodenoscopy    11/19/2006       WGN:[id number] Schatzki's ring with overlying distal esophageal erosions consistent with erosive reflux esophagitis, status post dilation and disruption of ring as described above/ Small hiatal hernia/Otherwise normal stomach, first duodenum and second duodenum   .  Bladder tack       .  Breast lumpectomy           benign   .  Colonoscopy with esophagogastroduodenoscopy (egd)  N/A  02/04/2013       Procedure: COLONOSCOPY WITH ESOPHAGOGASTRODUODENOSCOPY (EGD);  Surgeon: Corbin Ade, MD;   Location: [facility];  Service: Endoscopy;  Laterality: N/A;  10:15-moved to 9:30am   .  Esophageal biopsy  N/A  02/04/2013       Procedure: BIOPSY;  Surgeon: Corbin Ade, MD;  Location: [facility];  Service: Endoscopy;  Laterality: N/A;  SB BX AND RANDOM COLON BX   .  Eus  N/A  02/12/2013       Procedure: UPPER ENDOSCOPIC ULTRASOUND (EUS) LINEAR;  Surgeon: Rachael Fee, MD;  Location: [facility];  Service: Endoscopy;  Laterality: N/A;         Current Outpatient Prescriptions   Medication  Sig  Dispense  Refill   .  ALPRAZolam (XANAX) 0.5 MG tablet  Take 0.5 mg by mouth 2 (two) times daily as needed. For anxiety         .  amphetamine-dextroamphetamine (ADDERALL) 10 MG tablet  Take 10 mg by mouth daily.         .  Armodafinil (NUVIGIL) 250 MG tablet  Take 250 mg by mouth daily.          .  cetirizine (ZYRTEC) 10 MG tablet  Take 10 mg by mouth daily.         .  chlorthalidone (HYGROTON) 25 MG tablet  Take 25 mg by mouth daily.          Marland Kitchen  dexlansoprazole (DEXILANT) 60 MG capsule  Take 60 mg by mouth daily.         Marland Kitchen  dicyclomine (BENTYL) 10 MG capsule  Take 1 capsule (10 mg total) by mouth 4 (four) times daily -  before meals and at bedtime. As needed for diarrhea   90 capsule   0   .  gabapentin (NEURONTIN) 300 MG capsule  Take 300 mg by mouth 3 (three) times daily.         Marland Kitchen  glipiZIDE (GLUCOTROL XL) 5 MG 24 hr tablet  Take 5 mg by mouth daily.         .  Lactobacillus (PROBIOTIC ACIDOPHILUS PO)  Take 1 capsule by mouth daily.          Marland Kitchen  linagliptin (TRADJENTA) 5 MG TABS tablet  Take 5 mg by mouth daily.         Marland Kitchen  losartan (COZAAR) 100 MG tablet  Take 100 mg by mouth daily.           .  Multiple Vitamins-Minerals (MULTIVITAMINS THER. W/MINERALS) TABS  Take 1 tablet by mouth daily.           .  Nutritional Supplements (DHEA PO)  Take 1 tablet by mouth daily.           .  polyethylene glycol-electrolytes (TRILYTE) 420 G solution  Take 4,000 mLs by mouth as directed.   4000 mL    0   .  verapamil (CALAN-SR) 240 MG CR tablet  Take 240 mg by mouth daily.          .  Vitamin D, Ergocalciferol, (DRISDOL) 50000 UNITS CAPS  Take 50,000 Units by mouth every 7 (seven) days.               No current facility-administered medications for this visit.     Allergies   Allergen  Reactions   .  Other         Metal alloy-breaks her out-esp. Metal surgical instruments or some metal implanted in me   .  Asa [Aspirin]         Kidney doctor told her to not take ASA   .  Erythromycin     .  Famciclovir         migrane   .  Hydrocodone         "makes me lose my mind"-hallucinations   .  Ibuprofen         Kidney doctor told her to not take ibuprofen.   .  Oxycodone     .  Promethazine  Nausea And Vomiting   .  Tylenol [Acetaminophen]         Knocks her out.     Family History   Problem  Relation  Age of Onset   .  Colon cancer  Neg Hx     .  Cancer  Mother         ?ovarian   .  Cancer  Father      History    Social History   .  Marital Status:  Married       Spouse Name:  N/A       Number of Children:  N/A   .  Years of Education:  N/A       Social History Main Topics   .  Smoking status:  Former Games developer   .  Smokeless tobacco:  Former Neurosurgeon       Quit date:  02/11/1970   .  Alcohol Use:  No   .  Drug Use:  No   .  Sexual Activity:  None    REVIEW OF SYSTEMS - PERTINENT POSITIVES ONLY: 12 point review of systems negative other than HPI and PMH except for trouble swallowing, abdominal pain, diarrhea, nausea, weakness.    Wt Readings from Last 3 Encounters:  03/27/13 167 lb 12.8 oz (76.114 kg)  02/11/13 172 lb (78.019 kg)  02/11/13 172 lb (78.019 kg)   Temp Readings from Last 3 Encounters:  03/31/13 97.8 F (36.6 C) Oral  03/31/13 97.8 F (36.6 C) Oral  03/27/13 97.7 F (36.5 C) Oral   BP Readings from Last 3 Encounters:  03/31/13 142/58  03/31/13 142/58  03/27/13 129/74   Pulse Readings from Last 3 Encounters:  03/31/13 59  03/31/13 59   03/27/13 56    Gen:  No acute distress.  Well nourished and well groomed.   Neurological: Alert and oriented to person, place, and time. Coordination normal.  Head: Normocephalic and atraumatic.   Eyes: Conjunctivae are normal. Pupils are equal, round, and reactive to light. No scleral icterus.  Cardiovascular: Normal rate, regular rhythm. Respiratory: Effort normal.  No respiratory distress.  GI: Soft.The abdomen is soft and nontender.  There is no rebound and no guarding.  Skin: Skin is warm and dry. No rash noted. No diaphoresis. No erythema. No pallor. No clubbing, cyanosis, or edema.   Psychiatric: Normal mood and affect. Behavior is normal. Judgment and thought content normal.    LABORATORY RESULTS: Available labs are reviewed      K sl low 3.2, gluc 116, CBC WNL   Ct 09/2012 IMPRESSION:   1. No acute abnormality or finding to explain the patient's   symptoms.   2. Fatty infiltration of the liver.   3. Mild diverticulosis without diverticulitis.   4. Multilevel degenerative disc disease appearing worst at L4-5.      ASSESSMENT AND PLAN: Malignant carcinoid tumor of duodenum Patient appears to have a carcinoid tumor in the duodenal bulb. She is undergoing endoscopic ultrasound with Dr. Christella Hartigan, however this will likely just need to be surgically resected. We will make an attempt to do this laparoscopically. However, I advised the patient is unlikely that we will be successful based on the orientation of the mass. We will likely need to have exploratory laparotomy to decrease the risk of leaking.  EUS performed, confirmed that mass into muscular wall.    Maudry Diego MD Surgical Oncology, General and Endocrine Surgery [facility]    Visit Diagnoses: 1.  Malignant carcinoid tumor of duodenum     Primary Care Physician: Catalina Pizza, MD   GI Raiford Simmonds and Christella Hartigan.

## 2013-03-31 NOTE — Transfer of Care (Signed)
Immediate Anesthesia Transfer of Care Note  Patient: Tiffany Ortiz  Procedure(s) Performed: Procedure(s) (LRB): LAPAROSCOPIC  ASSISTED CONVERTED TO OPEN RESECTION DUODENAL MASS/UPPER ENDOSCOPY (N/A)  Patient Location: PACU  Anesthesia Type: General  Level of Consciousness: sedated, patient cooperative and responds to stimulation  Airway & Oxygen Therapy: Patient Spontanous Breathing and Patient connected to face mask oxgen  Post-op Assessment: Report given to PACU RN and Post -op Vital signs reviewed and stable  Post vital signs: Reviewed and stable  Complications: No apparent anesthesia complications

## 2013-04-01 ENCOUNTER — Encounter (HOSPITAL_COMMUNITY): Payer: Self-pay | Admitting: General Practice

## 2013-04-01 LAB — CBC
Hemoglobin: 12 g/dL (ref 12.0–15.0)
MCH: 29.4 pg (ref 26.0–34.0)
MCHC: 34.1 g/dL (ref 30.0–36.0)
Platelets: 292 10*3/uL (ref 150–400)
RBC: 4.08 MIL/uL (ref 3.87–5.11)
WBC: 16.3 10*3/uL — ABNORMAL HIGH (ref 4.0–10.5)

## 2013-04-01 LAB — GLUCOSE, CAPILLARY
Glucose-Capillary: 235 mg/dL — ABNORMAL HIGH (ref 70–99)
Glucose-Capillary: 166 mg/dL — ABNORMAL HIGH (ref 70–99)
Glucose-Capillary: 116 mg/dL — ABNORMAL HIGH (ref 70–99)
Glucose-Capillary: 125 mg/dL — ABNORMAL HIGH (ref 70–99)
Glucose-Capillary: 160 mg/dL — ABNORMAL HIGH (ref 70–99)

## 2013-04-01 LAB — BASIC METABOLIC PANEL
CO2: 22 mEq/L (ref 19–32)
Calcium: 9 mg/dL (ref 8.4–10.5)
Chloride: 99 mEq/L (ref 96–112)
Glucose, Bld: 285 mg/dL — ABNORMAL HIGH (ref 70–99)
Potassium: 3.3 mEq/L — ABNORMAL LOW (ref 3.5–5.1)
Sodium: 136 mEq/L (ref 135–145)

## 2013-04-01 LAB — HEMOGLOBIN A1C: Mean Plasma Glucose: 154 mg/dL — ABNORMAL HIGH (ref <117)

## 2013-04-01 MED ORDER — INSULIN GLARGINE 100 UNIT/ML ~~LOC~~ SOLN
4.0000 [IU] | Freq: Every day | SUBCUTANEOUS | Status: DC
  Administered 2013-04-01 – 2013-04-05 (×5): 4 [IU] via SUBCUTANEOUS
  Filled 2013-04-01 (×6): qty 0.04

## 2013-04-01 MED ORDER — POTASSIUM CHLORIDE 10 MEQ/100ML IV SOLN
10.0000 meq | INTRAVENOUS | Status: AC
  Administered 2013-04-01 (×4): 10 meq via INTRAVENOUS
  Filled 2013-04-01 (×4): qty 100

## 2013-04-01 NOTE — Care Management Note (Signed)
    Page 1 of 1   04/01/2013     10:40:17 AM   CARE MANAGEMENT NOTE 04/01/2013  Patient:  Tiffany Ortiz, Tiffany Ortiz   Account Number:  [id number]  Date Initiated:  04/01/2013  Documentation initiated by:  Lorenda Ishihara  Subjective/Objective Assessment:   67 yo female admitted s/p Lap converted to open resection of malignant duodenal mass, pyloroplasty. PTA lived at home with spouse.     Action/Plan:   Home when stable   Anticipated DC Date:  04/04/2013   Anticipated DC Plan:  HOME/SELF CARE      DC Planning Services  CM consult      Choice offered to / List presented to:             Status of service:  Completed, signed off Medicare Important Message given?  YES (If response is "NO", the following Medicare IM given date fields will be blank) Date Medicare IM given:  03/27/2013 Date Additional Medicare IM given:    Discharge Disposition:  HOME/SELF CARE  Per UR Regulation:  Reviewed for med. necessity/level of care/duration of stay  If discussed at Long Length of Stay Meetings, dates discussed:    Comments:

## 2013-04-01 NOTE — Progress Notes (Addendum)
1 Day Post-Op  Subjective: Pt complains of soreness.  No nausea or vomiting.  No fever or chills.    Objective: Vital signs in last 24 hours: Temp:  [97.5 F (36.4 C)-99 F (37.2 C)] 98 F (36.7 C) (11/26 0948) Pulse Rate:  [78-98] 86 (11/26 0948) Resp:  [13-20] 20 (11/26 1043) BP: (129-168)/(64-88) 129/70 mmHg (11/26 0948) SpO2:  [93 %-99 %] 98 % (11/26 0948) Weight:  [167 lb (75.751 kg)-167 lb 12.3 oz (76.099 kg)] 167 lb (75.751 kg) (11/25 1545)    Intake/Output from previous day: 11/25 0701 - 11/26 0700 In: 2920 [I.V.:2920] Out: 1330 [Urine:965; Drains:40; Blood:325] Intake/Output this shift: Total I/O In: 0  Out: 400 [Urine:400]  General appearance: alert, cooperative and no distress Resp: breathing comfortably GI: soft, sl distended, approp tender Extremities: extremities normal, atraumatic, no cyanosis or edema  Lab Results:   Recent Labs  03/31/13 2003 04/01/13 0420  WBC 20.1* 16.3*  HGB 12.9 12.0  HCT 37.8 35.2*  PLT 364 292   BMET  Recent Labs  03/31/13 2300 04/01/13 0420  NA  --  136  K  --  3.3*  CL  --  99  CO2  --  22  GLUCOSE  --  285*  BUN  --  13  CREATININE 0.80 0.84  CALCIUM  --  9.0   PT/INR No results found for this basename: LABPROT, INR,  in the last 72 hours ABG No results found for this basename: PHART, PCO2, PO2, HCO3,  in the last 72 hours  Studies/Results: Dg C-arm 1-60 Min-no Report  03/31/2013   CLINICAL DATA: PIECE OF CATHETER INTRODUCER  BROKE OFF   C-ARM 1-60 MINUTES  Fluoroscopy was utilized by the requesting physician.  No radiographic  interpretation.     Anti-infectives: Anti-infectives   Start     Dose/Rate Route Frequency Ordered Stop   03/31/13 2300  cefOXitin (MEFOXIN) 1 g in dextrose 5 % 50 mL IVPB     1 g 100 mL/hr over 30 Minutes Intravenous Every 6 hours 03/31/13 2207 04/01/13 1659   03/31/13 0930  cefOXitin (MEFOXIN) 2 g in dextrose 5 % 50 mL IVPB     2 g 100 mL/hr over 30 Minutes Intravenous On  call to O.R. 03/31/13 0855 03/31/13 1733      Assessment/Plan: s/p Procedure(s): LAPAROSCOPIC  ASSISTED CONVERTED TO OPEN RESECTION DUODENAL MASS/UPPER ENDOSCOPY (N/A) Continue foley due to urinary output monitoring NPO/NGT until at least tomorrow.   PCA Allergy to tylenol, gastric surgery, so I do not want to do NSAIDS.    IS, ambulate. Metoprolol, hydralazine for htn. Ativan for anxiety RISS for DM.  Add lantus.   Swallow tomorrow to eval for leak prior to removal of NGT and clears.     LOS: 1 day    [facility] 04/01/2013

## 2013-04-02 ENCOUNTER — Inpatient Hospital Stay (HOSPITAL_COMMUNITY): Payer: Medicare Other

## 2013-04-02 LAB — BASIC METABOLIC PANEL
CO2: 28 mEq/L (ref 19–32)
Calcium: 9.1 mg/dL (ref 8.4–10.5)
Creatinine, Ser: 0.9 mg/dL (ref 0.50–1.10)
GFR calc Af Amer: 75 mL/min — ABNORMAL LOW (ref >90)
Glucose, Bld: 145 mg/dL — ABNORMAL HIGH (ref 70–99)
Potassium: 3.6 mEq/L (ref 3.5–5.1)
Sodium: 137 mEq/L (ref 135–145)

## 2013-04-02 LAB — GLUCOSE, CAPILLARY
Glucose-Capillary: 127 mg/dL — ABNORMAL HIGH (ref 70–99)
Glucose-Capillary: 110 mg/dL — ABNORMAL HIGH (ref 70–99)

## 2013-04-02 LAB — CBC
MCH: 29.5 pg (ref 26.0–34.0)
MCV: 87.5 fL (ref 78.0–100.0)
Platelets: 282 10*3/uL (ref 150–400)
RBC: 3.69 MIL/uL — ABNORMAL LOW (ref 3.87–5.11)
WBC: 18.3 10*3/uL — ABNORMAL HIGH (ref 4.0–10.5)

## 2013-04-02 MED ORDER — PANTOPRAZOLE SODIUM 40 MG IV SOLR
40.0000 mg | Freq: Two times a day (BID) | INTRAVENOUS | Status: DC
  Administered 2013-04-02 – 2013-04-03 (×3): 40 mg via INTRAVENOUS
  Filled 2013-04-02 (×6): qty 40

## 2013-04-02 MED ORDER — IOHEXOL 300 MG/ML  SOLN
50.0000 mL | Freq: Once | INTRAMUSCULAR | Status: AC | PRN
  Administered 2013-04-02: 50 mL via ORAL

## 2013-04-02 NOTE — Progress Notes (Signed)
2 Days Post-Op  Subjective: C/o upper abdominal pain, but not using PCA  Objective: Vital signs in last 24 hours: Temp:  [97.7 F (36.5 C)-98.6 F (37 C)] 97.7 F (36.5 C) (11/27 0538) Pulse Rate:  [77-91] 77 (11/27 0538) Resp:  [15-22] 22 (11/27 0751) BP: (129-147)/(62-79) 145/79 mmHg (11/27 0538) SpO2:  [93 %-100 %] 100 % (11/27 0751)    Intake/Output from previous day: 11/26 0701 - 11/27 0700 In: 2630 [I.V.:2400; NG/GT:30; IV Piggyback:200] Out: 2690 [Urine:2050; Emesis/NG output:600; Drains:40] Intake/Output this shift:    General appearance: alert, cooperative and no distress Resp: nonlabored Cardio: normal rate, regular GI: soft, appropriate incisional tenderness, ND, wound with some bruising at lower aspect but no blanching erythema, onQ pump, no peritoneal signs, JP with ss output Extremities: SCD's bilat  Lab Results:   Recent Labs  04/01/13 0420 04/02/13 0450  WBC 16.3* 18.3*  HGB 12.0 10.9*  HCT 35.2* 32.3*  PLT 292 282   BMET  Recent Labs  04/01/13 0420 04/02/13 0450  NA 136 137  K 3.3* 3.6  CL 99 102  CO2 22 28  GLUCOSE 285* 145*  BUN 13 15  CREATININE 0.84 0.90  CALCIUM 9.0 9.1   PT/INR No results found for this basename: LABPROT, INR,  in the last 72 hours ABG No results found for this basename: PHART, PCO2, PO2, HCO3,  in the last 72 hours  Studies/Results: Dg Ugi W/water Sol Cm  04/02/2013   CLINICAL DATA:  Post resection of duodenal mass and pyloroplasty. Evaluate for leak.  EXAM: WATER SOLUBLE UPPER GI SERIES  TECHNIQUE: Single-column upper GI series was performed using water soluble contrast.  CONTRAST:  50mL OMNIPAQUE IOHEXOL 300 MG/ML  SOLN  COMPARISON:  No recent comparison examination.  Prior CT 09/30/2012  FLUOROSCOPY TIME:  1 min and 24 seconds  FINDINGS: Via the patient's feeding tube, 50 cc of Omnipaque 300 was slowly instilled under fluoroscopic guidance. Imaging performed predominantly with the patient in a supine position  and also with slight oblique position. The patient was not able to totally cooperate.  Circumferential thickening of the pylorus/ proximal duodenum. This may be related to edema from patient's recent surgery. Limited by the present exam for evaluating for mass at this level. No leak was identified.  Nasogastric tube was flushed with water.  IMPRESSION: Circumferential thickening of the pylorus/ proximal duodenum. This may be related to edema from patient's recent surgery. Limited by the present exam for evaluating for mass at this level. No leak was identified.  Please see above.  This is a call report.   Electronically Signed   By: Bridgett Larsson M.D.   On: 04/02/2013 09:05   Dg C-arm 1-60 Min-no Report  03/31/2013   CLINICAL DATA: PIECE OF CATHETER INTRODUCER  BROKE OFF   C-ARM 1-60 MINUTES  Fluoroscopy was utilized by the requesting physician.  No radiographic  interpretation.     Anti-infectives: Anti-infectives   Start     Dose/Rate Route Frequency Ordered Stop   03/31/13 2300  cefOXitin (MEFOXIN) 1 g in dextrose 5 % 50 mL IVPB     1 g 100 mL/hr over 30 Minutes Intravenous Every 6 hours 03/31/13 2207 04/01/13 1112   03/31/13 0930  cefOXitin (MEFOXIN) 2 g in dextrose 5 % 50 mL IVPB     2 g 100 mL/hr over 30 Minutes Intravenous On call to O.R. 03/31/13 0855 03/31/13 1733      Assessment/Plan: s/p Procedure(s): LAPAROSCOPIC  ASSISTED CONVERTED TO OPEN  RESECTION DUODENAL MASS/UPPER ENDOSCOPY (N/A) she seems to be doing okay.  c/o sore throat.  will start ice chips for comfort and hopefully to help sore throat.  BID protonix.  UGI without leak and JP looks okay.  NG still with bilious output.  will see how she does today with ice and consider NG removal tomorrow.    LOS: 2 days    Tiffany Ortiz 04/02/2013

## 2013-04-03 LAB — BASIC METABOLIC PANEL
Calcium: 9 mg/dL (ref 8.4–10.5)
Chloride: 104 mEq/L (ref 96–112)
GFR calc Af Amer: 79 mL/min — ABNORMAL LOW (ref >90)
Potassium: 3.4 mEq/L — ABNORMAL LOW (ref 3.5–5.1)
Sodium: 139 mEq/L (ref 135–145)

## 2013-04-03 LAB — CBC
MCH: 29.8 pg (ref 26.0–34.0)
MCHC: 33.6 g/dL (ref 30.0–36.0)
Platelets: 269 10*3/uL (ref 150–400)
RDW: 13.2 % (ref 11.5–15.5)
WBC: 16.3 10*3/uL — ABNORMAL HIGH (ref 4.0–10.5)

## 2013-04-03 LAB — GLUCOSE, CAPILLARY
Glucose-Capillary: 131 mg/dL — ABNORMAL HIGH (ref 70–99)
Glucose-Capillary: 120 mg/dL — ABNORMAL HIGH (ref 70–99)
Glucose-Capillary: 123 mg/dL — ABNORMAL HIGH (ref 70–99)

## 2013-04-03 MED ORDER — LOSARTAN POTASSIUM 50 MG PO TABS
100.0000 mg | ORAL_TABLET | Freq: Every day | ORAL | Status: DC
  Administered 2013-04-03 – 2013-04-06 (×4): 100 mg via ORAL
  Filled 2013-04-03 (×4): qty 2

## 2013-04-03 MED ORDER — CHLORHEXIDINE GLUCONATE 0.12 % MT SOLN
15.0000 mL | Freq: Two times a day (BID) | OROMUCOSAL | Status: DC
  Administered 2013-04-03 – 2013-04-05 (×4): 15 mL via OROMUCOSAL
  Filled 2013-04-03 (×7): qty 15

## 2013-04-03 MED ORDER — MORPHINE SULFATE 2 MG/ML IJ SOLN
1.0000 mg | INTRAMUSCULAR | Status: DC | PRN
  Administered 2013-04-04 – 2013-04-06 (×4): 2 mg via INTRAVENOUS
  Filled 2013-04-03 (×4): qty 1

## 2013-04-03 MED ORDER — VERAPAMIL HCL ER 240 MG PO TBCR
240.0000 mg | EXTENDED_RELEASE_TABLET | Freq: Every day | ORAL | Status: DC
  Administered 2013-04-03 – 2013-04-06 (×4): 240 mg via ORAL
  Filled 2013-04-03 (×4): qty 1

## 2013-04-03 MED ORDER — BIOTENE DRY MOUTH MT LIQD
15.0000 mL | Freq: Two times a day (BID) | OROMUCOSAL | Status: DC
  Administered 2013-04-03 – 2013-04-04 (×4): 15 mL via OROMUCOSAL

## 2013-04-03 NOTE — Progress Notes (Signed)
3 Days Post-Op  Subjective: Had episode of confusion last night but seems okay this am.  Family at bedside.  Objective: Vital signs in last 24 hours: Temp:  [97.9 F (36.6 C)-98.4 F (36.9 C)] 98.4 F (36.9 C) (11/28 0450) Pulse Rate:  [51-67] 54 (11/28 0450) Resp:  [13-20] 16 (11/28 0737) BP: (131-167)/(73-77) 163/77 mmHg (11/28 0450) SpO2:  [95 %-100 %] 100 % (11/28 0737)    Intake/Output from previous day: 11/27 0701 - 11/28 0700 In: 2370 [I.V.:2370] Out: 2355 [Urine:1800; Emesis/NG output:500; Drains:55] Intake/Output this shift:    General appearance: alert, cooperative and no distress Resp: nonlabored Cardio: normal rate, regular GI: soft, appropriate upper abdominal tenderness, ND, wounds okay, JP ss, no bile.  no peritoneal signs Extremities: SCD's bilat  Lab Results:   Recent Labs  04/02/13 0450 04/03/13 0425  WBC 18.3* 16.3*  HGB 10.9* 11.2*  HCT 32.3* 33.3*  PLT 282 269   BMET  Recent Labs  04/02/13 0450 04/03/13 0425  NA 137 139  K 3.6 3.4*  CL 102 104  CO2 28 26  GLUCOSE 145* 129*  BUN 15 13  CREATININE 0.90 0.86  CALCIUM 9.1 9.0   PT/INR No results found for this basename: LABPROT, INR,  in the last 72 hours ABG No results found for this basename: PHART, PCO2, PO2, HCO3,  in the last 72 hours  Studies/Results: Dg Ugi W/water Sol Cm  04/02/2013   CLINICAL DATA:  Post resection of duodenal mass and pyloroplasty. Evaluate for leak.  EXAM: WATER SOLUBLE UPPER GI SERIES  TECHNIQUE: Single-column upper GI series was performed using water soluble contrast.  CONTRAST:  50mL OMNIPAQUE IOHEXOL 300 MG/ML  SOLN  COMPARISON:  No recent comparison examination.  Prior CT 09/30/2012  FLUOROSCOPY TIME:  1 min and 24 seconds  FINDINGS: Via the patient's feeding tube, 50 cc of Omnipaque 300 was slowly instilled under fluoroscopic guidance. Imaging performed predominantly with the patient in a supine position and also with slight oblique position. The patient  was not able to totally cooperate.  Circumferential thickening of the pylorus/ proximal duodenum. This may be related to edema from patient's recent surgery. Limited by the present exam for evaluating for mass at this level. No leak was identified.  Nasogastric tube was flushed with water.  IMPRESSION: Circumferential thickening of the pylorus/ proximal duodenum. This may be related to edema from patient's recent surgery. Limited by the present exam for evaluating for mass at this level. No leak was identified.  Please see above.  This is a call report.   Electronically Signed   By: Bridgett Larsson M.D.   On: 04/02/2013 09:05    Anti-infectives: Anti-infectives   Start     Dose/Rate Route Frequency Ordered Stop   03/31/13 2300  cefOXitin (MEFOXIN) 1 g in dextrose 5 % 50 mL IVPB     1 g 100 mL/hr over 30 Minutes Intravenous Every 6 hours 03/31/13 2207 04/01/13 1112   03/31/13 0930  cefOXitin (MEFOXIN) 2 g in dextrose 5 % 50 mL IVPB     2 g 100 mL/hr over 30 Minutes Intravenous On call to O.R. 03/31/13 0855 03/31/13 1733      Assessment/Plan: s/p Procedure(s): LAPAROSCOPIC  ASSISTED CONVERTED TO OPEN RESECTION DUODENAL MASS/UPPER ENDOSCOPY (N/A) remove NG tube.  d/c PCA.  sips of clears and restart home meds.  restart cpap per home settings  LOS: 3 days    Tiffany Ortiz 04/03/2013

## 2013-04-04 LAB — TYPE AND SCREEN
ABO/RH(D): A POS
Antibody Screen: NEGATIVE
Unit division: 0

## 2013-04-04 LAB — BASIC METABOLIC PANEL
BUN: 10 mg/dL (ref 6–23)
Calcium: 9.2 mg/dL (ref 8.4–10.5)
Chloride: 103 mEq/L (ref 96–112)
GFR calc Af Amer: 80 mL/min — ABNORMAL LOW (ref >90)
Glucose, Bld: 140 mg/dL — ABNORMAL HIGH (ref 70–99)
Potassium: 3.2 mEq/L — ABNORMAL LOW (ref 3.5–5.1)

## 2013-04-04 LAB — CBC
HCT: 32.4 % — ABNORMAL LOW (ref 36.0–46.0)
Hemoglobin: 11 g/dL — ABNORMAL LOW (ref 12.0–15.0)
MCV: 87.8 fL (ref 78.0–100.0)
WBC: 11.9 10*3/uL — ABNORMAL HIGH (ref 4.0–10.5)

## 2013-04-04 LAB — GLUCOSE, CAPILLARY
Glucose-Capillary: 122 mg/dL — ABNORMAL HIGH (ref 70–99)
Glucose-Capillary: 131 mg/dL — ABNORMAL HIGH (ref 70–99)

## 2013-04-04 MED ORDER — KCL IN DEXTROSE-NACL 20-5-0.45 MEQ/L-%-% IV SOLN
INTRAVENOUS | Status: DC
  Administered 2013-04-04: 17:00:00 via INTRAVENOUS
  Filled 2013-04-04 (×3): qty 1000

## 2013-04-04 MED ORDER — POTASSIUM CHLORIDE 10 MEQ/100ML IV SOLN
10.0000 meq | INTRAVENOUS | Status: AC
  Administered 2013-04-04 (×4): 10 meq via INTRAVENOUS
  Filled 2013-04-04 (×4): qty 100

## 2013-04-04 MED ORDER — DEXLANSOPRAZOLE 60 MG PO CPDR
60.0000 mg | DELAYED_RELEASE_CAPSULE | Freq: Every day | ORAL | Status: DC
  Administered 2013-04-04 – 2013-04-06 (×3): 60 mg via ORAL
  Filled 2013-04-04 (×3): qty 1

## 2013-04-04 NOTE — Progress Notes (Signed)
4 Days Post-Op  Subjective: Doing well. Pain controlled  Objective: Vital signs in last 24 hours: Temp:  [98.2 F (36.8 C)-99.1 F (37.3 C)] 99.1 F (37.3 C) (11/29 0624) Pulse Rate:  [53-61] 53 (11/29 0624) Resp:  [16] 16 (11/29 0624) BP: (124-145)/(60-65) 124/65 mmHg (11/29 0624) SpO2:  [95 %-98 %] 97 % (11/29 0624) Last BM Date: 04/03/13  Intake/Output from previous day: 11/28 0701 - 11/29 0700 In: 2400 [I.V.:2400] Out: 1272 [Urine:1250; Drains:22] Intake/Output this shift:    General appearance: alert, cooperative and no distress Resp: nonlabored Cardio: HR 53, regular GI: soft, appropriate incisional tenderness, ND, wounds without infection, jp ss Extremities: SCD's bilat  Lab Results:   Recent Labs  04/03/13 0425 04/04/13 0500  WBC 16.3* 11.9*  HGB 11.2* 11.0*  HCT 33.3* 32.4*  PLT 269 305   BMET  Recent Labs  04/03/13 0425 04/04/13 0500  NA 139 139  K 3.4* 3.2*  CL 104 103  CO2 26 26  GLUCOSE 129* 140*  BUN 13 10  CREATININE 0.86 0.85  CALCIUM 9.0 9.2   PT/INR No results found for this basename: LABPROT, INR,  in the last 72 hours ABG No results found for this basename: PHART, PCO2, PO2, HCO3,  in the last 72 hours  Studies/Results: Dg Ugi W/water Sol Cm  04/02/2013   CLINICAL DATA:  Post resection of duodenal mass and pyloroplasty. Evaluate for leak.  EXAM: WATER SOLUBLE UPPER GI SERIES  TECHNIQUE: Single-column upper GI series was performed using water soluble contrast.  CONTRAST:  50mL OMNIPAQUE IOHEXOL 300 MG/ML  SOLN  COMPARISON:  No recent comparison examination.  Prior CT 09/30/2012  FLUOROSCOPY TIME:  1 min and 24 seconds  FINDINGS: Via the patient's feeding tube, 50 cc of Omnipaque 300 was slowly instilled under fluoroscopic guidance. Imaging performed predominantly with the patient in a supine position and also with slight oblique position. The patient was not able to totally cooperate.  Circumferential thickening of the pylorus/  proximal duodenum. This may be related to edema from patient's recent surgery. Limited by the present exam for evaluating for mass at this level. No leak was identified.  Nasogastric tube was flushed with water.  IMPRESSION: Circumferential thickening of the pylorus/ proximal duodenum. This may be related to edema from patient's recent surgery. Limited by the present exam for evaluating for mass at this level. No leak was identified.  Please see above.  This is a call report.   Electronically Signed   By: Bridgett Larsson M.D.   On: 04/02/2013 09:05    Anti-infectives: Anti-infectives   Start     Dose/Rate Route Frequency Ordered Stop   03/31/13 2300  cefOXitin (MEFOXIN) 1 g in dextrose 5 % 50 mL IVPB     1 g 100 mL/hr over 30 Minutes Intravenous Every 6 hours 03/31/13 2207 04/01/13 1112   03/31/13 0930  cefOXitin (MEFOXIN) 2 g in dextrose 5 % 50 mL IVPB     2 g 100 mL/hr over 30 Minutes Intravenous On call to O.R. 03/31/13 0855 03/31/13 1733      Assessment/Plan: s/p Procedure(s): LAPAROSCOPIC  ASSISTED CONVERTED TO OPEN RESECTION DUODENAL MASS/UPPER ENDOSCOPY (N/A) advance diet  LOS: 4 days    Tiffany Ortiz 04/04/2013

## 2013-04-04 NOTE — Progress Notes (Signed)
Patient continues to refuse CPAP therapy. 

## 2013-04-04 NOTE — Progress Notes (Signed)
Pt states she is having an anxiety attack and can't tolerate wearing her CPAP tonight. Pt is on room air and SpO2 is 97%. Pt was informed if she changes her mind and wants to wear her CPAP to contact RT. RT will continue to monitor as needed.

## 2013-04-05 LAB — BASIC METABOLIC PANEL
BUN: 7 mg/dL (ref 6–23)
CO2: 25 mEq/L (ref 19–32)
Calcium: 9.1 mg/dL (ref 8.4–10.5)
Chloride: 106 mEq/L (ref 96–112)
Creatinine, Ser: 0.83 mg/dL (ref 0.50–1.10)
GFR calc Af Amer: 83 mL/min — ABNORMAL LOW (ref >90)
Glucose, Bld: 152 mg/dL — ABNORMAL HIGH (ref 70–99)

## 2013-04-05 LAB — CBC
HCT: 31.9 % — ABNORMAL LOW (ref 36.0–46.0)
Hemoglobin: 10.7 g/dL — ABNORMAL LOW (ref 12.0–15.0)
MCH: 29.5 pg (ref 26.0–34.0)
MCV: 87.9 fL (ref 78.0–100.0)
Platelets: 307 10*3/uL (ref 150–400)
RBC: 3.63 MIL/uL — ABNORMAL LOW (ref 3.87–5.11)

## 2013-04-05 LAB — GLUCOSE, CAPILLARY
Glucose-Capillary: 153 mg/dL — ABNORMAL HIGH (ref 70–99)
Glucose-Capillary: 129 mg/dL — ABNORMAL HIGH (ref 70–99)
Glucose-Capillary: 136 mg/dL — ABNORMAL HIGH (ref 70–99)

## 2013-04-05 MED ORDER — INSULIN ASPART 100 UNIT/ML ~~LOC~~ SOLN
0.0000 [IU] | Freq: Three times a day (TID) | SUBCUTANEOUS | Status: DC
  Administered 2013-04-05 – 2013-04-06 (×3): 3 [IU] via SUBCUTANEOUS

## 2013-04-05 MED ORDER — INSULIN GLARGINE 100 UNIT/ML ~~LOC~~ SOLN: 4.0000 [IU] | Freq: Every day | SUBCUTANEOUS | Status: DC

## 2013-04-05 MED ORDER — INSULIN ASPART 100 UNIT/ML ~~LOC~~ SOLN: 0.0000 [IU] | Freq: Every day | SUBCUTANEOUS | Status: DC

## 2013-04-05 NOTE — Progress Notes (Signed)
5 Days Post-Op  Subjective: Still having diarrhea.  Not much appetite but did okay with full liquids  Objective: Vital signs in last 24 hours: Temp:  [97.9 F (36.6 C)-98.8 F (37.1 C)] 98.3 F (36.8 C) (11/30 0515) Pulse Rate:  [50-61] 50 (11/30 0515) Resp:  [18] 18 (11/30 0515) BP: (124-162)/(72-76) 162/72 mmHg (11/30 0515) SpO2:  [93 %-97 %] 96 % (11/30 0515) Last BM Date: 04/03/13  Intake/Output from previous day: 11/29 0701 - 11/30 0700 In: 2450 [I.V.:2150; IV Piggyback:300] Out: 1615 [Urine:1600; Drains:15] Intake/Output this shift:    General appearance: alert, cooperative and no distress Resp: nonlabored Cardio: brady, regular GI: soft, appropriate tenderness, ND, incision looks okay, jp serous Extremities: SCD's bilat  Lab Results:   Recent Labs  04/04/13 0500 04/05/13 0508  WBC 11.9* 9.9  HGB 11.0* 10.7*  HCT 32.4* 31.9*  PLT 305 307   BMET  Recent Labs  04/04/13 0500 04/05/13 0508  NA 139 140  K 3.2* 3.8  CL 103 106  CO2 26 25  GLUCOSE 140* 152*  BUN 10 7  CREATININE 0.85 0.83  CALCIUM 9.2 9.1   PT/INR No results found for this basename: LABPROT, INR,  in the last 72 hours ABG No results found for this basename: PHART, PCO2, PO2, HCO3,  in the last 72 hours  Studies/Results: No results found.  Anti-infectives: Anti-infectives   Start     Dose/Rate Route Frequency Ordered Stop   03/31/13 2300  cefOXitin (MEFOXIN) 1 g in dextrose 5 % 50 mL IVPB     1 g 100 mL/hr over 30 Minutes Intravenous Every 6 hours 03/31/13 2207 04/01/13 1112   03/31/13 0930  cefOXitin (MEFOXIN) 2 g in dextrose 5 % 50 mL IVPB     2 g 100 mL/hr over 30 Minutes Intravenous On call to O.R. 03/31/13 0855 03/31/13 1733      Assessment/Plan: s/p Procedure(s): LAPAROSCOPIC  ASSISTED CONVERTED TO OPEN RESECTION DUODENAL MASS/UPPER ENDOSCOPY (N/A) she looks appropriate.  should be okay to advance diet.  hopefully home tomorrow or the next.    LOS: 5 days     Lodema Pilot DAVID 04/05/2013

## 2013-04-05 NOTE — Progress Notes (Signed)
Patient continues to refuse nocturnal CPAP. She complains of bloating and gas pain with increased pressure. She also explains that she once had a machine at home but was not able to wear it as expected for insurance purposes and the machine was taken away. Therefore, she does not have a machine at home at this time. She is aware that RT is present and available if she should require further assistance. Equipment remains at bedside.

## 2013-04-06 ENCOUNTER — Telehealth (INDEPENDENT_AMBULATORY_CARE_PROVIDER_SITE_OTHER): Payer: Self-pay

## 2013-04-06 LAB — GLUCOSE, CAPILLARY: Glucose-Capillary: 128 mg/dL — ABNORMAL HIGH (ref 70–99)

## 2013-04-06 MED ORDER — HYDROMORPHONE HCL 2 MG PO TABS: 1.0000 mg | ORAL_TABLET | ORAL | Status: DC | PRN

## 2013-04-06 MED ORDER — MORPHINE SULFATE 10 MG/5ML PO SOLN
2.5000 mg | ORAL | Status: DC | PRN
  Filled 2013-04-06: qty 5

## 2013-04-06 MED ORDER — LOPERAMIDE HCL 2 MG PO TABS: 2.0000 mg | ORAL_TABLET | Freq: Three times a day (TID) | ORAL | Status: DC

## 2013-04-06 MED ORDER — MORPHINE SULFATE 10 MG/5ML PO SOLN: 2.5000 mg | ORAL | Status: DC | PRN

## 2013-04-06 NOTE — Anesthesia Postprocedure Evaluation (Signed)
Anesthesia Post Note  Patient: Tiffany Ortiz  Procedure(s) Performed: Procedure(s) (LRB): LAPAROSCOPIC  ASSISTED CONVERTED TO OPEN RESECTION DUODENAL MASS/UPPER ENDOSCOPY (N/A)  Anesthesia type: General  Patient location: PACU  Post pain: Pain level controlled  Post assessment: Post-op Vital signs reviewed  Last Vitals: BP 144/53  Pulse 54  Temp(Src) 36.8 C (Oral)  Resp 16  Ht 5\' 3"  (1.6 m)  Wt 167 lb (75.751 kg)  BMI 29.59 kg/m2  SpO2 98%  Post vital signs: Reviewed  Level of consciousness: sedated  Complications: No apparent anesthesia complications

## 2013-04-06 NOTE — Discharge Summary (Addendum)
Physician Discharge Summary  Patient ID: Tiffany Ortiz MRN: [id number] DOB/AGE: [id number] y.o.  Admit date: 03/31/2013 Discharge date: 04/06/2013  Admission Diagnoses: Patient Active Problem List   Diagnosis Date Noted  . Malignant carcinoid tumor of duodenum 02/13/2013    Priority: High-  . Carcinoid tumor of intestine 03/31/2013  . Nonspecific (abnormal) findings on radiological and other examination of gastrointestinal tract 02/12/2013  . Chronic diarrhea 01/28/2013  . GERD (gastroesophageal reflux disease) 01/28/2013  . Esophageal dysphagia 01/28/2013    Discharge Diagnoses:  Patient Active Problem List   Diagnosis Date Noted  . Malignant carcinoid tumor of duodenum, metastatic to lymph nodes.   02/13/2013    Priority: High  . Carcinoid tumor of intestine 03/31/2013  . Nonspecific (abnormal) findings on radiological and other examination of gastrointestinal tract 02/12/2013  . Chronic diarrhea 01/28/2013  . GERD (gastroesophageal reflux disease) 01/28/2013  . Esophageal dysphagia 01/28/2013    Discharged Condition: stable  Hospital Course:  Pt was admitted to the floor following lap converted to open resection of duodenal carcinoid and portal lymph node biopsy.  She had some pain issues up front.  She was kept NPO for several days.  She had swallow study demonstrating no leak from repair.  She was able to tolerate diet advance.  She had some issues with pain medication because she does not like it to make her too sedated, and most of them make her nauseated.  She did well with some morphine.    She was able to ambulate and void independently.  She was stable prior to d/c.    Consults: None  Significant Diagnostic Studies: radiology: swallow study negative for leak  Treatments: insulin: regular and surgery: see above.    Discharge Exam: Blood pressure 144/82, pulse 63, temperature 98.1 F (36.7 C), temperature source Oral, resp. rate 18, height 5\' 3"  (1.6 m),  weight 167 lb (75.751 kg), SpO2 99.00%. General appearance: alert and cooperative Resp: breathing comfortably GI: soft, non distended, incision c/d/i.    Disposition: 01-Home or Self Care  Discharge Orders   Future Orders Complete By Expires   Call MD for:  difficulty breathing, headache or visual disturbances  As directed    Call MD for:  persistant nausea and vomiting  As directed    Call MD for:  redness, tenderness, or signs of infection (pain, swelling, redness, odor or green/yellow discharge around incision site)  As directed    Call MD for:  severe uncontrolled pain  As directed    Call MD for:  temperature >100.4  As directed    Diet - low sodium heart healthy  As directed    Discharge wound care:  As directed    Comments:     Remove drain prior to d/c.  Dry dressing as needed for 1-2 days   Increase activity slowly  As directed        Medication List    STOP taking these medications       polyethylene glycol-electrolytes 420 G solution  Commonly known as:  NuLYTELY/GoLYTELY      TAKE these medications       ALPRAZolam 0.5 MG tablet  Commonly known as:  XANAX  Take 0.5 mg by mouth 2 (two) times daily. For anxiety     amphetamine-dextroamphetamine 10 MG tablet  Commonly known as:  ADDERALL  Take 10 mg by mouth daily.     cetirizine 10 MG tablet  Commonly known as:  ZYRTEC  Take 10 mg by  mouth daily.     chlorthalidone 25 MG tablet  Commonly known as:  HYGROTON  Take 25 mg by mouth daily with breakfast.     DEXILANT 60 MG capsule  Generic drug:  dexlansoprazole  Take 60 mg by mouth daily.     dicyclomine 10 MG capsule  Commonly known as:  BENTYL  Take 10 mg by mouth daily as needed (diarrhea). As needed for diarrhea     FLUoxetine 10 MG tablet  Commonly known as:  PROZAC  Take 10 mg by mouth daily.     gabapentin 300 MG capsule  Commonly known as:  NEURONTIN  Take 300 mg by mouth 3 (three) times daily.     glipiZIDE 5 MG 24 hr tablet  Commonly  known as:  GLUCOTROL XL  Take 5 mg by mouth daily.     linagliptin 5 MG Tabs tablet  Commonly known as:  TRADJENTA  Take 5 mg by mouth daily.     losartan 100 MG tablet  Commonly known as:  COZAAR  Take 100 mg by mouth daily.     morphine 10 MG/5ML solution  Take 1.3 mLs (2.6 mg total) by mouth every 2 (two) hours as needed for severe pain.     NUVIGIL 250 MG tablet  Generic drug:  Armodafinil  Take 250 mg by mouth daily.     oxymetazoline 0.05 % nasal spray  Commonly known as:  AFRIN  Place 1 spray into both nostrils 2 (two) times daily as needed for congestion.     pravastatin 20 MG tablet  Commonly known as:  PRAVACHOL  Take 20 mg by mouth daily at 12 noon.     PROBIOTIC ACIDOPHILUS PO  Take 1 capsule by mouth daily.     SOOTHE OP  Place 1 drop into both eyes 2 (two) times daily.     verapamil 240 MG CR tablet  Commonly known as:  CALAN-SR  Take 240 mg by mouth daily.     Vitamin D (Ergocalciferol) 50000 UNITS Caps capsule  Commonly known as:  DRISDOL  Take 50,000 Units by mouth every 7 (seven) days.         SignedAlmond Lint 04/06/2013, 1:31 PM

## 2013-04-06 NOTE — Telephone Encounter (Signed)
LMOV pt has hospital f/u appt with Dr. Donell Beers on 04/24/13 at 9:45 a.m.

## 2013-04-07 ENCOUNTER — Telehealth (INDEPENDENT_AMBULATORY_CARE_PROVIDER_SITE_OTHER): Payer: Self-pay

## 2013-04-07 LAB — GLUCOSE, CAPILLARY: Glucose-Capillary: 142 mg/dL — ABNORMAL HIGH (ref 70–99)

## 2013-04-07 NOTE — Telephone Encounter (Signed)
Pt calling to request a refill of Xanax and new Rx for Tramadol.  She was d/c from the hospital with Morphine elixir, but says it is too strong for her.  Dr. Donell Beers authorized Xanax 0.5 mg #30 take 2 q day no refill as needed for anxiety, and Tramadol 50 mg #30 1 q 6 hours prn pain w/ 1 refill.  [facility] had record of #90 Xanax prescribed by Dr. Margo Aye on 03/28/13, so we did not refill.  Pt made aware that the Tramadol was called in to the pharmacy, but she could not have the Xanax.  She said she "lost" her Rx.  I recommended that she continue looking for this medicine as we will not be refilling it at this time.  Pt understood and agreed.

## 2013-04-09 ENCOUNTER — Telehealth (INDEPENDENT_AMBULATORY_CARE_PROVIDER_SITE_OTHER): Payer: Self-pay | Admitting: General Surgery

## 2013-04-09 NOTE — Telephone Encounter (Signed)
Pt called to ask if she needs to put anything on her incision following her shower.  Pt denies any drainage or odor, stating it is only slightly swollen and pink as would be expected.  Advised pt to pat area dry and okay to cover incision with gauze if it is irritated by touching her clothing.  She understands and will call back as needed.

## 2013-04-14 DIAGNOSIS — G4700 Insomnia, unspecified: Secondary | ICD-10-CM | POA: Diagnosis not present

## 2013-04-14 DIAGNOSIS — F5102 Adjustment insomnia: Secondary | ICD-10-CM | POA: Diagnosis not present

## 2013-04-14 DIAGNOSIS — F411 Generalized anxiety disorder: Secondary | ICD-10-CM | POA: Diagnosis not present

## 2013-04-14 DIAGNOSIS — C7A010 Malignant carcinoid tumor of the duodenum: Secondary | ICD-10-CM | POA: Diagnosis not present

## 2013-04-20 ENCOUNTER — Encounter (INDEPENDENT_AMBULATORY_CARE_PROVIDER_SITE_OTHER): Payer: Self-pay | Admitting: General Surgery

## 2013-04-20 ENCOUNTER — Other Ambulatory Visit (INDEPENDENT_AMBULATORY_CARE_PROVIDER_SITE_OTHER): Payer: Self-pay | Admitting: General Surgery

## 2013-04-20 ENCOUNTER — Ambulatory Visit (INDEPENDENT_AMBULATORY_CARE_PROVIDER_SITE_OTHER): Payer: Medicare Other | Admitting: General Surgery

## 2013-04-20 VITALS — BP 140/82 | HR 84 | Temp 98.0°F | Resp 14 | Ht 63.0 in | Wt 160.4 lb

## 2013-04-20 DIAGNOSIS — C7A010 Malignant carcinoid tumor of the duodenum: Secondary | ICD-10-CM

## 2013-04-20 NOTE — Patient Instructions (Signed)
We will get repeat endoscopy/EUS in 2 months.  I will refer you to oncology.  Try 2 benadryl at night for sleep.

## 2013-04-21 NOTE — Assessment & Plan Note (Signed)
Symptoms of diarrhea have resolved with resection of mass and large portal node.   Plan repeat endo in 2-3 months to reevaluate for recurrent carcinoid since no residual carcinoid was found in specimen.    Will need oncology referral for node positive carcinoid.

## 2013-04-21 NOTE — Progress Notes (Signed)
HISTORY: Patient is a 67 year old female approximately 2 weeks status post duodenal resection for carcinoid. She was found to have an enlarged lymph node at the porta which was positive for metastatic neuroendocrine tumor. Interestingly enough, the mass that was removed and the duodenum did not have residual carcinoid. There definitely was carcinoid in the original biopsy. She is doing reasonably well post surgery. She is not having any significant pain. Her appetite is slowly improving. She is still tired and weak. She denies nausea vomiting. Her diarrhea has resolved.    EXAM: General:  Alert and oriented.   Incision:  Healing well.  No evidence of infection,.     PATHOLOGY: 1. Lymph node, biopsy METASTATIC NEUROENDOCRINE CARCINOMA IN ONE OF ONE LYMPH NODE (1/1). 2. Small intestine, resection - BRUNNER'S GLAND HYPERPLASIA WITH CYSTIC CHANGE. - CAUTERIZED CELLS AT INKED MARGIN. - SEE COMMENT.  ASSESSMENT AND PLAN:   Malignant carcinoid tumor of duodenum Symptoms of diarrhea have resolved with resection of mass and large portal node.   Plan repeat endo in 2-3 months to reevaluate for recurrent carcinoid since no residual carcinoid was found in specimen.    Will need oncology referral for node positive carcinoid.        Tiffany Diego, MD Surgical Oncology, General & Endocrine Surgery [facility], Leeanne Rio, [facility], MD Catalina Pizza, MD

## 2013-04-23 ENCOUNTER — Telehealth: Payer: Self-pay | Admitting: *Deleted

## 2013-04-23 DIAGNOSIS — Z79899 Other long term (current) drug therapy: Secondary | ICD-10-CM | POA: Diagnosis not present

## 2013-04-23 DIAGNOSIS — E559 Vitamin D deficiency, unspecified: Secondary | ICD-10-CM | POA: Diagnosis not present

## 2013-04-23 DIAGNOSIS — R809 Proteinuria, unspecified: Secondary | ICD-10-CM | POA: Diagnosis not present

## 2013-04-23 DIAGNOSIS — D649 Anemia, unspecified: Secondary | ICD-10-CM | POA: Diagnosis not present

## 2013-04-23 DIAGNOSIS — N189 Chronic kidney disease, unspecified: Secondary | ICD-10-CM | POA: Diagnosis not present

## 2013-04-23 NOTE — Telephone Encounter (Signed)
Unable to reach patient by phone.  Left voice message and call back number with contact information.

## 2013-04-24 ENCOUNTER — Encounter (INDEPENDENT_AMBULATORY_CARE_PROVIDER_SITE_OTHER): Payer: Medicare Other | Admitting: General Surgery

## 2013-04-24 ENCOUNTER — Telehealth: Payer: Self-pay | Admitting: *Deleted

## 2013-04-24 NOTE — Telephone Encounter (Signed)
Spoke with patient by phone and confirmed appointment with Dr. Truett Perna for 05/04/13.  Contact names, numbers, and directions were provided.

## 2013-04-28 DIAGNOSIS — I509 Heart failure, unspecified: Secondary | ICD-10-CM | POA: Diagnosis not present

## 2013-04-28 DIAGNOSIS — E1129 Type 2 diabetes mellitus with other diabetic kidney complication: Secondary | ICD-10-CM | POA: Diagnosis not present

## 2013-04-28 DIAGNOSIS — I10 Essential (primary) hypertension: Secondary | ICD-10-CM | POA: Diagnosis not present

## 2013-04-28 DIAGNOSIS — R809 Proteinuria, unspecified: Secondary | ICD-10-CM | POA: Diagnosis not present

## 2013-04-28 DIAGNOSIS — N182 Chronic kidney disease, stage 2 (mild): Secondary | ICD-10-CM | POA: Diagnosis not present

## 2013-05-04 ENCOUNTER — Encounter: Payer: Self-pay | Admitting: Oncology

## 2013-05-04 ENCOUNTER — Ambulatory Visit (HOSPITAL_BASED_OUTPATIENT_CLINIC_OR_DEPARTMENT_OTHER): Payer: Medicare Other | Admitting: Oncology

## 2013-05-04 ENCOUNTER — Telehealth: Payer: Self-pay | Admitting: Oncology

## 2013-05-04 ENCOUNTER — Ambulatory Visit: Payer: Medicare Other

## 2013-05-04 VITALS — BP 137/79 | HR 93 | Temp 98.5°F | Resp 18 | Ht 63.0 in | Wt 159.9 lb

## 2013-05-04 DIAGNOSIS — I10 Essential (primary) hypertension: Secondary | ICD-10-CM | POA: Diagnosis not present

## 2013-05-04 DIAGNOSIS — E119 Type 2 diabetes mellitus without complications: Secondary | ICD-10-CM

## 2013-05-04 DIAGNOSIS — R197 Diarrhea, unspecified: Secondary | ICD-10-CM | POA: Diagnosis not present

## 2013-05-04 DIAGNOSIS — C7A010 Malignant carcinoid tumor of the duodenum: Secondary | ICD-10-CM | POA: Diagnosis not present

## 2013-05-04 NOTE — Progress Notes (Signed)
Checked in new patient with no financial issues.  °

## 2013-05-04 NOTE — Progress Notes (Signed)
[facility] New Patient Consult   Referring KG:MWNUU Tiffany Ortiz 67 y.o.  [birth year]    Reason for Referral: Carcinoid tumor     HPI: She reports a 3-1/2 year history of diarrhea. She also has a history of dysphagia. She was referred to Dr. Jena Gauss and was taken to upper and lower endoscopy procedures on 02/04/2013. A 4 mm polyp was noted at 12 cm from the anal verge, a diminutive polyp was noted in the mid ascending colon. The distal 5 cm of the terminal ileum appeared normal. A Schatzki's ring was present, the esophagus otherwise appears normal. Slightly nodular mucosa was noted in the gastric cardia, the gastric mucosa was otherwise normal. No ulcer. A 1.25 cm sessile polypoid lesion was noted in the posterior bulb of the duodenum. The nodules biopsy. The Schatzki's ring was dilated. The gastric mucosa was biopsy. The pathology [id number]) revealed normal mucosa at the ascending colon and an ascending colon hyperplastic polyp. The rectum polyp returned as a hyperplastic polyp. The biopsy from the nodular stomach mucosa was negative for dysplasia or malignancy. An H. pylori stain was negative. The duodenum lesion returned as a well-differentiated low-grade neuroendocrine tumor.  She was referred to Dr. Christella Hartigan for an endoscopic ultrasound on 02/12/2013. In the distal duodenal bulb along the posterior wall there was a 1-2 cm raised nodule. The duodenum was otherwise normal. The nodule appeared in the deep mucosa layer with focal involvement of the muscularis propria. No periportal, perigastric, or celiac adenopathy.  She was referred to Dr. Donell Beers and was taken the operating room on 03/31/2013. Resection of the duodenal mass and a pyloroplasty were performed. Initial palpation of the duodenum did not reveal the location of the carcinoid tumor. On endoscopy the tumor was found distal to the pylorus on the mesenteric side. An enlarged porta lymph node was resected.  The mass was palpated and resected from the duodenum.  The pathology [id number]) revealed a metastatic neuroendocrine carcinoma involving the resected lymph node. The small intestine resection revealed Brunner's gland hyperplasia with cystic change. No definitive residual neuroendocrine carcinoma was identified. Additional levels of specimen 2 were taken and there was no evidence of neuroendocrine carcinoma.  She reports improvement in diarrhea following surgery.  Past Medical History  Diagnosis Date  . Fibromyalgia   . Diabetes mellitus   . Renal disorder   . Hypertension   . IBS (irritable bowel syndrome)   . Vitamin D deficiency   . Sleep apnea   .  G3 P2, one miscarriage    . GERD (gastroesophageal reflux disease)   . Hyperlipidemia   . Anxiety   . Depression   .  history of esophageal Schatzki's ring, status post endoscopic dilatation   02/04/2013    .    "Cervical cancer "                                                                                   age 42  .     Peripheral neuropathy  Past Surgical History  Procedure Laterality Date  . Abdominal hysterectomy      Cervical cancer, with incidental appendectomy  . Colonoscopy  09/22/2003    RMR: Diminutive rectal polyps cold biopsy/removed.  Otherwise normal rectal mucosa/Pancolonic diverticula.  The remainder of the colonic mucosa appeared normal  . Esophagogastroduodenoscopy  11/19/2006    AVW:[id number] Schatzki's ring with overlying distal esophageal erosions consistent with erosive reflux esophagitis, status post dilation and disruption of ring as described above/ Small hiatal hernia/Otherwise normal stomach, first duodenum and second duodenum  . Bladder tack    . Breast lumpectomy      benign  . Colonoscopy with esophagogastroduodenoscopy (egd) N/A 02/04/2013    Procedure: COLONOSCOPY WITH ESOPHAGOGASTRODUODENOSCOPY (EGD);  Surgeon: Corbin Ade, MD;  Location: [facility];  Service: Endoscopy;  Laterality: N/A;   10:15-moved to 9:30am  . Esophageal biopsy N/A 02/04/2013    Procedure: BIOPSY;  Surgeon: Corbin Ade, MD;  Location: [facility] END[facility] SUITE;  Service: Endoscopy;  Laterality: N/A;  SB BX AND RANDOM COLON BX  . Eus N/A 02/12/2013    Procedure: UPPER ENDOSCOPIC ULTRASOUND (EUS) LINEAR;  Surgeon: Rachael Fee, MD;  Location: [facility] ENDOSCOPY;  Service: Endoscopy;  Laterality: N/A;  . Appendectomy    . Tonsillectomy    . Laparoscopic small bowel resection N/A 03/31/2013    Procedure: LAPAROSCOPIC  ASSISTED CONVERTED TO OPEN RESECTION DUODENAL MASS/UPPER ENDOSCOPY;  Surgeon: Almond Lint, MD;  Location: [facility] ORS;  Service: General;  Laterality: N/A;    Family History  Problem Relation Age of Onset  . Colon cancer Neg Hx   . Cancer Mother     ?ovarian  . Cancer Father    no other family history of cancer  Current outpatient prescriptions:ALPRAZolam (XANAX) 1 MG tablet, Take 1 mg by mouth at bedtime as needed for anxiety., Disp: , Rfl: ;  amphetamine-dextroamphetamine (ADDERALL) 10 MG tablet, Take 10 mg by mouth daily., Disp: , Rfl: ;  cetirizine (ZYRTEC) 10 MG tablet, Take 10 mg by mouth daily., Disp: , Rfl: ;  chlorthalidone (HYGROTON) 25 MG tablet, Take 25 mg by mouth daily with breakfast. , Disp: , Rfl:  Cholecalciferol (VITAMIN D PO), Take 5,000 Units by mouth once a week., Disp: , Rfl: ;  dexlansoprazole (DEXILANT) 60 MG capsule, Take 60 mg by mouth daily., Disp: , Rfl: ;  FLUoxetine (PROZAC) 10 MG tablet, Take 10 mg by mouth daily., Disp: , Rfl: ;  gabapentin (NEURONTIN) 300 MG capsule, Take 300 mg by mouth 3 (three) times daily., Disp: , Rfl: ;  glipiZIDE (GLUCOTROL XL) 5 MG 24 hr tablet, Take 5 mg by mouth daily., Disp: , Rfl:  Lactobacillus (PROBIOTIC ACIDOPHILUS PO), Take 1 capsule by mouth daily. , Disp: , Rfl: ;  linagliptin (TRADJENTA) 5 MG TABS tablet, Take 5 mg by mouth daily., Disp: , Rfl: ;  loperamide (IMODIUM A-D) 2 MG tablet, Take 1 tablet (2 mg total) by mouth 3 (three) times daily before  meals., Disp: 90 tablet, Rfl: 0;  losartan (COZAAR) 100 MG tablet, Take 100 mg by mouth daily. , Disp: , Rfl:  oxymetazoline (AFRIN) 0.05 % nasal spray, Place 1 spray into both nostrils 2 (two) times daily as needed for congestion., Disp: , Rfl: ;  pravastatin (PRAVACHOL) 20 MG tablet, Take 20 mg by mouth daily at 12 noon., Disp: , Rfl: ;  Propylene Glycol-Glycerin (SOOTHE OP), Place 1 drop into both eyes 2 (two) times daily., Disp: , Rfl: ;  verapamil (CALAN-SR) 240 MG CR tablet, Take 240 mg by mouth daily. , Disp: , Rfl:  Armodafinil (NUVIGIL) 250 MG tablet, Take 250 mg by mouth daily. ,  Disp: , Rfl:   Allergies:  Allergies  Allergen Reactions  . Other     Metal alloy-breaks her out-esp. Metal surgical instruments or some metal implanted in me  . Asa [Aspirin]     Kidney doctor told her to not take ASA  . Erythromycin   . Famciclovir     migrane  . Hydrocodone     "makes me lose my mind"-hallucinations  . Ibuprofen     Kidney doctor told her to not take ibuprofen.  . Lactose Intolerance (Gi) Diarrhea and Other (See Comments)    Bloating  . Oxycodone     hallucinations  . Promethazine Nausea And Vomiting  . Tylenol [Acetaminophen]     Knocks her out.    Social History: She lives with her husband in [place]. 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 She does not use tobacco or alcohol. No transfusion history. No risk factor for HIV or hepatitis.   History  Smoking status  . Former Smoker  . Types: Cigarettes  Smokeless tobacco  . Former Neurosurgeon  . Quit date: 02/11/1970     ROS:   Positives include: Hot flashes/? Flushing prior to surgery, intermittent diarrhea for 3-1/2 years-improved following surgery, fatigue, reflux symptoms and dysphagia, soreness in the mouth numbness and pain in the feet  A complete ROS was otherwise negative.  Physical Exam:  Blood pressure 137/79, pulse 93, temperature 98.5 F (36.9 C), temperature source Oral, resp. rate 18, height 5\' 3"  (1.6  m), weight 159 lb 14.4 oz (72.53 kg), SpO2 100.00%.  HEENT: Oropharynx without visible mass, neck without mass Lungs: Clear bilaterally Cardiac: Regular rate and rhythm, no murmur Abdomen: Healed surgical incisions, no hepatosplenomegaly, no mass, no apparent ascites  Vascular: No leg edema Lymph nodes: No cervical, supraclavicular, axillary, or inguinal nodes Neurologic: Alert and oriented, the motor exam appears intact in the upper and lower extremities Skin: Macular light Wakeley lesions at the upper back Musculoskeletal: No spine tenderness   LAB:  CBC  Lab Results  Component Value Date   WBC 9.9 04/05/2013   HGB 10.7* 04/05/2013   HCT 31.9* 04/05/2013   MCV 87.9 04/05/2013   PLT 307 04/05/2013   03/27/2013-hemoglobin 14.3  CMP      Component Value Date/Time   NA 140 04/05/2013 0508   NA 141 12/01/2012   K 3.8 04/05/2013 0508   K 4.1 12/01/2012   CL 106 04/05/2013 0508   CO2 25 04/05/2013 0508   GLUCOSE 152* 04/05/2013 0508   BUN 7 04/05/2013 0508   BUN 18 12/01/2012   CREATININE 0.83 04/05/2013 0508   CREATININE 0.9 12/01/2012   CALCIUM 9.1 04/05/2013 0508   CALCIUM 9.7 12/01/2012   AST 17 12/01/2012   ALT 19 12/01/2012   ALKPHOS 107.0 12/01/2012   BILITOT 0.3 12/01/2012   GFRNONAA 71* 04/05/2013 0508   GFRAA 83* 04/05/2013 0508     Radiology:  CT of the abdomen and pelvis 09/30/2012: fatty infiltration of the liver, no focal liver lesion. Normal stomach and small bowel, no lymphadenopathy  Assessment/Plan:   1. 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, clinical stage III (uT2, pN1), status post an endoscopic biopsy 02/04/2013 confirming a duodenal carcinoid tumor, status post a duodenal resection 03/31/2013 with no remaining tumor found in the duodenum and a positive portal lymph node  2. Diarrhea-etiology unclear, potentially related to the carcinoid tumor  3. Diabetes  4. Sleep apnea  5. Fibromyalgia  6.  Hypertension  7. Gastroesophageal  reflux disease  8. Status post dilatation of a Schatzki's ring 02/04/2013   Disposition:   Tiffany Ortiz has been diagnosed with a carcinoid tumor of the duodenum. No remaining tumor was found at the time of the duodenal resection. She will be scheduled for a repeat endoscopy within the next few months to be sure the tumor was removed.  She has a good prognosis for a long-term survival with regard to the small bowel carcinoid tumor. There is no role for systemic therapy in the adjuvant setting.  We will obtain baseline chromogranin A and 24-hour urine 5 HIAA levels within the next few weeks. She will hold the Dexilant with for one week prior to obtaining these studies. She will return for an office and lab visit in 6 months.  It is unlikely the diarrhea is related to "carcinoid syndrome ".We can consider additional diagnostic evaluation with staging CT scans or an Octreoscan if the diarrhea returns.  Tiffany Ortiz 05/04/2013, 6:05 PM

## 2013-05-04 NOTE — Telephone Encounter (Signed)
gv and printed appt sched and avs for ptfor Jan and June 2015

## 2013-05-18 ENCOUNTER — Other Ambulatory Visit: Payer: Medicare Other

## 2013-05-18 DIAGNOSIS — Z789 Other specified health status: Secondary | ICD-10-CM | POA: Diagnosis not present

## 2013-05-18 DIAGNOSIS — C7A010 Malignant carcinoid tumor of the duodenum: Secondary | ICD-10-CM | POA: Diagnosis not present

## 2013-05-19 ENCOUNTER — Telehealth: Payer: Self-pay | Admitting: *Deleted

## 2013-05-19 NOTE — Telephone Encounter (Signed)
Wanted to confirm MD wants her off her Dexilant before she collects her 24 hour urine. Confirmed w/her that he wants her off this for 6 days before and during the collection of her urine. She understands and agrees.

## 2013-05-26 DIAGNOSIS — E119 Type 2 diabetes mellitus without complications: Secondary | ICD-10-CM | POA: Diagnosis not present

## 2013-05-26 DIAGNOSIS — I10 Essential (primary) hypertension: Secondary | ICD-10-CM | POA: Diagnosis not present

## 2013-05-26 LAB — CHROMOGRANIN A: Chromogranin A: 43 ng/mL — ABNORMAL HIGH (ref 1.9–15.0)

## 2013-05-28 DIAGNOSIS — E119 Type 2 diabetes mellitus without complications: Secondary | ICD-10-CM | POA: Diagnosis not present

## 2013-05-28 DIAGNOSIS — I10 Essential (primary) hypertension: Secondary | ICD-10-CM | POA: Diagnosis not present

## 2013-05-28 DIAGNOSIS — R197 Diarrhea, unspecified: Secondary | ICD-10-CM | POA: Diagnosis not present

## 2013-05-28 DIAGNOSIS — C7A010 Malignant carcinoid tumor of the duodenum: Secondary | ICD-10-CM | POA: Diagnosis not present

## 2013-05-29 ENCOUNTER — Telehealth: Payer: Self-pay | Admitting: *Deleted

## 2013-05-29 NOTE — Telephone Encounter (Signed)
Call from pt, having 3-6 loose stools daily. Unable to begin 24 hour urine collection due to diarrhea. Having acid reflux as well. Asking what she can take for this that will not interfere with her lab results. (Holding Dexilant.) Reviewed with Dr. Benay Spice: OK to take OTC antacids (MOM, or OTC Zantac PRN. Continue Imodium, call surgeon for diarrhea that does not respond to Imodium. Pt voiced understanding.

## 2013-06-03 ENCOUNTER — Telehealth: Payer: Self-pay

## 2013-06-03 NOTE — Telephone Encounter (Signed)
Pt was contacted to schedule EUS f/u and she tells me that she had surgery on November for carcinoid.  She has had diarrhea since the surgery and doesn't feel like having any procedures at this time, she also has a family member that she is having to take care of.  I will forward to Dr Ardis Hughs for review.

## 2013-06-04 NOTE — Telephone Encounter (Signed)
I know about her surgery this past November with Dr. Barry Dienes.    Dr. Barry Dienes, Dr. Benay Spice and I all feel she needs repeat upper endoscopy (with EUS) in the next 1-2 months.  If she declines to schedule, please ask her at least to come to office to discuss with me.

## 2013-06-05 NOTE — Telephone Encounter (Signed)
Pt would like to call back and schedule the EUS she is just not ready to set up anything at this time.  I will send a reminder to call the pt in a couple months

## 2013-06-12 ENCOUNTER — Telehealth: Payer: Self-pay | Admitting: *Deleted

## 2013-06-12 DIAGNOSIS — C7A010 Malignant carcinoid tumor of the duodenum: Secondary | ICD-10-CM | POA: Diagnosis not present

## 2013-06-12 NOTE — Telephone Encounter (Signed)
Patient had called "Call A Nurse" X 3 with questions about her 24 hour urine collection and how to transport specimen to office. Attempted to call her to clarify any remaining problems-no answer or machine.

## 2013-06-15 ENCOUNTER — Ambulatory Visit (INDEPENDENT_AMBULATORY_CARE_PROVIDER_SITE_OTHER): Payer: Medicare Other | Admitting: General Surgery

## 2013-06-15 ENCOUNTER — Other Ambulatory Visit (INDEPENDENT_AMBULATORY_CARE_PROVIDER_SITE_OTHER): Payer: Self-pay | Admitting: General Surgery

## 2013-06-15 VITALS — BP 124/84 | HR 72 | Temp 98.0°F | Resp 18 | Ht 63.0 in | Wt 155.0 lb

## 2013-06-15 DIAGNOSIS — C7A010 Malignant carcinoid tumor of the duodenum: Secondary | ICD-10-CM

## 2013-06-15 DIAGNOSIS — K529 Noninfective gastroenteritis and colitis, unspecified: Secondary | ICD-10-CM

## 2013-06-15 NOTE — Patient Instructions (Signed)
Please see Dr. Ardis Hughs in clinic to discuss diarrhea.    Then schedule EUS.  Follow up with me in 6 months.   I will also send my note to Dr. Nevada Crane.

## 2013-06-15 NOTE — Assessment & Plan Note (Signed)
I'm not sure the patient has recurrent diarrhea, but I am concerned since her chromogranin was elevated that she may have residual neuroendocrine disease.  I would start with endoscopic ultrasound. We would also likely consider cross-sectional imaging if EUS is negative.  Like her to talk to GI regarding the continued diarrhea. She has taken a significant amount of Imodium without improvement. She has also tried Lomotil. I advised her to try Pepto-Bismol.

## 2013-06-15 NOTE — Progress Notes (Signed)
HISTORY: Patient is a 68 year old female who is approximately 2 months status post excision of duodenal carcinoid. Her case is complicated by the fact that there was no residual tumor on her surgical specimen. There is definitely carcinoid in the original specimen. We removed the area of the tattoo. We also removed a mass in the region that was described in the endoscopy.  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  Her diarrhea that she had pre op was improved post op, but now has recurred.  She is unable to eat greens because they make her diarrhea worse.  She has been helping out with her uncle due to his health problems.     PERTINENT REVIEW OF SYSTEMS: Otherwise negative x 11.    Filed Vitals:   06/15/13 1023  BP: 124/84  Pulse: 72  Temp: 98 F (36.7 C)  Resp: 18   Wt Readings from Last 3 Encounters:  06/15/13 155 lb (70.308 kg)  05/04/13 159 lb 14.4 oz (72.53 kg)  04/20/13 160 lb 6.4 oz (72.757 kg)    EXAM: Head: Normocephalic and atraumatic.  Eyes:  Conjunctivae are normal. Pupils are equal, round, and reactive to light. No scleral icterus.  Neck:  Normal range of motion. Neck supple. No tracheal deviation present. No thyromegaly present.  Resp: No respiratory distress, normal effort. Abd:  Abdomen is soft, non distended and non tender. No masses are palpable.  There is no rebound and no guarding. no hernia at incision. Neurological: Alert and oriented to person, place, and time. Coordination normal.  Skin: Skin is warm and dry. No rash noted. No diaphoretic. No erythema. No pallor.  Psychiatric: Normal mood and affect. Normal behavior. Judgment and thought content normal.      ASSESSMENT AND PLAN:   Malignant carcinoid tumor of duodenum I'm not sure the patient has recurrent diarrhea, but I am concerned since her chromogranin was elevated that she may have residual neuroendocrine disease.  I would start with endoscopic ultrasound. We would also  likely consider cross-sectional imaging if EUS is negative.  Like her to talk to GI regarding the continued diarrhea. She has taken a significant amount of Imodium without improvement. She has also tried Lomotil. I advised her to try Pepto-Bismol.        Milus Height, MD Surgical Oncology, [facility], [place] [facility], [facility], MD Delphina Cahill, MD

## 2013-06-18 ENCOUNTER — Telehealth: Payer: Self-pay | Admitting: Gastroenterology

## 2013-06-18 LAB — 5 HIAA, QUANTITATIVE, URINE, 24 HOUR
5-HIAA, 24 Hr Urine: 2.8 mg/24 h (ref <=6.0)
Volume, Urine-5HIAA: 900 mL/24 h

## 2013-06-18 NOTE — Telephone Encounter (Signed)
Pt has been seen by Dr. Gala Romney and would like to switch care to Dr. Ardis Hughs. Will you accept this pt? Please advise.

## 2013-06-19 ENCOUNTER — Telehealth: Payer: Self-pay | Admitting: *Deleted

## 2013-06-19 NOTE — Telephone Encounter (Signed)
Message copied by Brien Few on Fri Jun 19, 2013  8:30 AM ------      Message from: Betsy Coder B      Created: Thu Jun 18, 2013  8:54 PM       Please call patient, urine test is normal, f/u as scheduled with [place] and Dr. Ardis Hughs ------

## 2013-06-22 NOTE — Telephone Encounter (Signed)
Left VM to call re: labs

## 2013-06-22 NOTE — Telephone Encounter (Signed)
Ok, she will need new patient office appointment iwht me if she wants to completely switch care.

## 2013-06-22 NOTE — Telephone Encounter (Signed)
appt has been scheduled.

## 2013-06-24 ENCOUNTER — Telehealth: Payer: Self-pay | Admitting: *Deleted

## 2013-06-24 NOTE — Telephone Encounter (Signed)
Message copied by Norville Haggard P on Wed Jun 24, 2013 11:15 AM ------      Message from: Betsy Coder B      Created: Thu Jun 18, 2013  8:54 PM       Please call patient, urine test is normal, f/u as scheduled with [place] and Dr. Ardis Hughs ------

## 2013-06-24 NOTE — Telephone Encounter (Signed)
Per Dr. Benay Spice, notified pt that urine test is normal, f/u as scheduled with [place] and Dr. Ardis Hughs.  Pt verbalized understanding and expressed appreciation for call.

## 2013-07-06 ENCOUNTER — Telehealth: Payer: Self-pay

## 2013-07-06 NOTE — Telephone Encounter (Signed)
Pt has appt to speak with Dr Ardis Hughs regarding switching care from Dr Gala Romney to Dr Ardis Hughs

## 2013-07-06 NOTE — Telephone Encounter (Signed)
Message copied by Barron Alvine on Mon Jul 06, 2013  8:14 AM ------      Message from: Barron Alvine      Created: Tue May 05, 2013  2:53 PM                   ----- Message -----         From: Milus Banister, MD         Sent: 05/05/2013   2:43 PM           To: Barron Alvine, CMA            Yes,      She needs upper EUS, EGD in mid to late February for duodenal carcinoid lesion.              Mac, radial scope, 60 min.  Thanks            dj            ----- Message -----         From: Barron Alvine, CMA         Sent: 05/05/2013   2:32 PM           To: Milus Banister, MD, Carola Frost, RN            No there is no mention of repeat needed.  I can forward to Dr Ardis Hughs for review.Marland Kitchen      ----- Message -----         From: Carola Frost, RN         Sent: 05/05/2013   2:04 PM           To: Barron Alvine, CMA, Ladell Pier, MD            Hi,      Do you know if this patient is scheduled with Dr. Ardis Hughs for a repeat endoscopy within the next few months to be sure the tumor was removed?  Dr.Sherrill wanted to make sure this was on the schedule.      Thanks so much,      Barnett Applebaum             ------

## 2013-07-28 ENCOUNTER — Encounter: Payer: Self-pay | Admitting: Gastroenterology

## 2013-07-28 ENCOUNTER — Ambulatory Visit (INDEPENDENT_AMBULATORY_CARE_PROVIDER_SITE_OTHER): Payer: Medicare Other | Admitting: Gastroenterology

## 2013-07-28 ENCOUNTER — Ambulatory Visit: Payer: Medicare Other | Admitting: Gastroenterology

## 2013-07-28 VITALS — BP 128/76 | HR 68 | Ht 63.0 in | Wt 155.0 lb

## 2013-07-28 DIAGNOSIS — R933 Abnormal findings on diagnostic imaging of other parts of digestive tract: Secondary | ICD-10-CM | POA: Diagnosis not present

## 2013-07-28 DIAGNOSIS — R197 Diarrhea, unspecified: Secondary | ICD-10-CM | POA: Diagnosis not present

## 2013-07-28 NOTE — Patient Instructions (Signed)
You will be set up for an upper endoscopic ultrasound (60 min MAC) for previous duodenal carcinoid (early morning case, next available Thursday). Glucotrol #1 side effect is diarrhea.

## 2013-07-28 NOTE — Progress Notes (Signed)
Review of pertinent gastrointestinal problems: 1. Chronic diarrhea: Colonoscopy Dr. Rourk 02/2013: pan diverticulosis, normal TI, biopsies randomly from colon were normal, a few small HPs were removed. 01/2013 celiac panel, TSH were both normal. 2. Duodenal carcinoid, spread to local LN: EGD Dr. Rourk 02/2013; noted incidentally in bulb of duodenum, biopsied. EUS Dr. Jacobs 02/2013 confirmed MP involvement, underwent surgical resection Dr. Byerly: small bowel resection 03/2013 no residual carcinoid, but one nearby LN was positive for carcinoid. 3. EUS 10/2014above also noted changes consistent with chronic pancreatitis  HPI: This is a    very pleasant 68-year-old woman whom I last saw the time of an upper endoscopic ultrasound several months ago.  Diarrhea improved immediately after surgery, then it returned.  Lately still with intermittent diarrhea, constipation.  Will take periodic imodium, will take periodic stool softners.  In past few weeks has not needed imodium.  Certain veggies cause diarrhea.  Overall her weight has lost 20 pounds in past 6 months.     Review of systems: Pertinent positive and negative review of systems were noted in the above HPI section. Complete review of systems was performed and was otherwise normal.    Past Medical History  Diagnosis Date  . Fibromyalgia   . Diabetes mellitus   . Renal disorder   . Hypertension   . IBS (irritable bowel syndrome)   . Vitamin D deficiency   . Sleep apnea   . Headache(784.0)   . GERD (gastroesophageal reflux disease)   . Hyperlipidemia   . Anxiety   . Depression   . Shortness of breath     Past Surgical History  Procedure Laterality Date  . Abdominal hysterectomy      Cervical cancer, with incidental appendectomy  . Colonoscopy  09/22/2003    RMR: Diminutive rectal polyps cold biopsy/removed.  Otherwise normal rectal mucosa/Pancolonic diverticula.  The remainder of the colonic mucosa appeared normal  .  Esophagogastroduodenoscopy  11/19/2006    RMR:Prominent Schatzki's ring with overlying distal esophageal erosions consistent with erosive reflux esophagitis, status post dilation and disruption of ring as described above/ Small hiatal hernia/Otherwise normal stomach, first duodenum and second duodenum  . Bladder tack    . Breast lumpectomy      benign  . Colonoscopy with esophagogastroduodenoscopy (egd) N/A 02/04/2013    Procedure: COLONOSCOPY WITH ESOPHAGOGASTRODUODENOSCOPY (EGD);  Surgeon: Robert M Rourk, MD;  Location: [facility];  Service: Endoscopy;  Laterality: N/A;  10:15-moved to 9:30am  . Esophageal biopsy N/A 02/04/2013    Procedure: BIOPSY;  Surgeon: Robert M Rourk, MD;  Location: [facility];  Service: Endoscopy;  Laterality: N/A;  SB BX AND RANDOM COLON BX  . Eus N/A 02/12/2013    Procedure: UPPER ENDOSCOPIC ULTRASOUND (EUS) LINEAR;  Surgeon: Daniel P Jacobs, MD;  Location: [facility];  Service: Endoscopy;  Laterality: N/A;  . Appendectomy    . Tonsillectomy    . Laparoscopic small bowel resection N/A 03/31/2013    Procedure: LAPAROSCOPIC  ASSISTED CONVERTED TO OPEN RESECTION DUODENAL MASS/UPPER ENDOSCOPY;  Surgeon: Faera Byerly, MD;  Location: [facility];  Service: General;  Laterality: N/A;    Current Outpatient Prescriptions  Medication Sig Dispense Refill  . ALPRAZolam (XANAX) 1 MG tablet Take 1 mg by mouth at bedtime as needed for anxiety.      . amphetamine-dextroamphetamine (ADDERALL) 10 MG tablet Take 10 mg by mouth daily.      . Armodafinil (NUVIGIL) 250 MG tablet Take 250 mg by mouth daily.       .   cetirizine (ZYRTEC) 10 MG tablet Take 10 mg by mouth daily.      . chlorthalidone (HYGROTON) 25 MG tablet Take 25 mg by mouth daily with breakfast.       . Cholecalciferol (VITAMIN D PO) Take 5,000 Units by mouth once a week.      Marland Kitchen dexlansoprazole (DEXILANT) 60 MG capsule Take 60 mg by mouth daily.      Marland Kitchen FLUoxetine (PROZAC) 10 MG tablet Take 10 mg by mouth daily.      Marland Kitchen  gabapentin (NEURONTIN) 300 MG capsule Take 300 mg by mouth 3 (three) times daily.      Marland Kitchen glipiZIDE (GLUCOTROL XL) 5 MG 24 hr tablet Take 5 mg by mouth daily.      . Lactobacillus (PROBIOTIC ACIDOPHILUS PO) Take 1 capsule by mouth daily.       Marland Kitchen linagliptin (TRADJENTA) 5 MG TABS tablet Take 5 mg by mouth daily.      Marland Kitchen loperamide (IMODIUM A-D) 2 MG tablet Take 1 tablet (2 mg total) by mouth 3 (three) times daily before meals.  90 tablet  0  . losartan (COZAAR) 100 MG tablet Take 100 mg by mouth daily.       Marland Kitchen oxymetazoline (AFRIN) 0.05 % nasal spray Place 1 spray into both nostrils 2 (two) times daily as needed for congestion.      . pravastatin (PRAVACHOL) 20 MG tablet Take 20 mg by mouth daily at 12 noon.      Marland Kitchen Propylene Glycol-Glycerin (SOOTHE OP) Place 1 drop into both eyes 2 (two) times daily.      . verapamil (CALAN-SR) 240 MG CR tablet Take 240 mg by mouth daily.        No current facility-administered medications for this visit.    Allergies as of 07/28/2013 - Review Complete 07/28/2013  Allergen Reaction Noted  . Other  01/28/2013  . Asa [aspirin]  02/06/2013  . Erythromycin  01/28/2013  . Famciclovir  02/11/2013  . Hydrocodone  02/04/2013  . Ibuprofen  02/06/2013  . Lactose intolerance (gi) Diarrhea and Other (See Comments) 03/31/2013  . Oxycodone  01/28/2013  . Promethazine Nausea And Vomiting 01/30/2011  . Tylenol [acetaminophen]  02/06/2013    Family History  Problem Relation Age of Onset  . Colon cancer Neg Hx   . Cancer Mother     ?ovarian  . Cancer Father     History   Social History  . Marital Status: Married    Spouse Name: N/A    Number of Children: N/A  . Years of Education: N/A   Occupational History  . Not on file.   Social History Main Topics  . Smoking status: Former Smoker    Types: Cigarettes  . Smokeless tobacco: Former Systems developer    Quit date: 02/11/1970  . Alcohol Use: No  . Drug Use: No  . Sexual Activity: Not on file   Other Topics  Concern  . 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 She had colonoscopy with a different gastroenterologist for months ago without inflammation of her colon, no microscopic colitis on biopsies. Lately she is telling me she alternates between constipation and loose stools. It does not seem she is bothered by Carol diarrhea for the past several weeks at least. We have question of the primary site of her duodenal carcinoid, I would like to repeat EGD with endoscopic ultrasound use of the duodenum as well as the pancreas.      

## 2013-07-29 ENCOUNTER — Telehealth: Payer: Self-pay | Admitting: Gastroenterology

## 2013-07-29 NOTE — Telephone Encounter (Signed)
Pt was rescheduled to 08/20/13 1030 am she was given new instructions and verbalized understanding

## 2013-07-30 ENCOUNTER — Encounter (HOSPITAL_COMMUNITY): Payer: Self-pay | Admitting: Pharmacy Technician

## 2013-07-30 ENCOUNTER — Encounter (HOSPITAL_COMMUNITY): Payer: Self-pay | Admitting: *Deleted

## 2013-08-12 NOTE — Progress Notes (Signed)
08-12-13 1115 Patient telephoned to inquire of meds AM of procedure. Desiring to take on necessay meds-istructed to take Verapamil only with sip water. Arrive at 0900 AM at admitting  for 1030 procedure. W. Floy Sabina

## 2013-08-20 ENCOUNTER — Ambulatory Visit (HOSPITAL_COMMUNITY)
Admission: RE | Admit: 2013-08-20 | Discharge: 2013-08-20 | Disposition: A | Discharge status: Home / Self Care | Payer: Medicare Other | Source: Ambulatory Visit | Attending: Gastroenterology | Admitting: Gastroenterology

## 2013-08-20 ENCOUNTER — Ambulatory Visit (HOSPITAL_COMMUNITY): Payer: Medicare Other | Admitting: Anesthesiology

## 2013-08-20 ENCOUNTER — Encounter (HOSPITAL_COMMUNITY): Payer: Self-pay | Admitting: *Deleted

## 2013-08-20 ENCOUNTER — Encounter (HOSPITAL_COMMUNITY): Payer: Medicare Other | Admitting: Anesthesiology

## 2013-08-20 ENCOUNTER — Encounter (HOSPITAL_COMMUNITY)
Admission: RE | Disposition: A | Discharge status: Home / Self Care | Payer: Self-pay | Source: Ambulatory Visit | Attending: Gastroenterology

## 2013-08-20 DIAGNOSIS — I10 Essential (primary) hypertension: Secondary | ICD-10-CM | POA: Insufficient documentation

## 2013-08-20 DIAGNOSIS — IMO0001 Reserved for inherently not codable concepts without codable children: Secondary | ICD-10-CM | POA: Diagnosis not present

## 2013-08-20 DIAGNOSIS — Z9049 Acquired absence of other specified parts of digestive tract: Secondary | ICD-10-CM | POA: Diagnosis not present

## 2013-08-20 DIAGNOSIS — F329 Major depressive disorder, single episode, unspecified: Secondary | ICD-10-CM | POA: Insufficient documentation

## 2013-08-20 DIAGNOSIS — K589 Irritable bowel syndrome without diarrhea: Secondary | ICD-10-CM | POA: Insufficient documentation

## 2013-08-20 DIAGNOSIS — K219 Gastro-esophageal reflux disease without esophagitis: Secondary | ICD-10-CM | POA: Insufficient documentation

## 2013-08-20 DIAGNOSIS — R933 Abnormal findings on diagnostic imaging of other parts of digestive tract: Secondary | ICD-10-CM

## 2013-08-20 DIAGNOSIS — D3A010 Benign carcinoid tumor of the duodenum: Secondary | ICD-10-CM | POA: Diagnosis not present

## 2013-08-20 DIAGNOSIS — R197 Diarrhea, unspecified: Secondary | ICD-10-CM

## 2013-08-20 DIAGNOSIS — E785 Hyperlipidemia, unspecified: Secondary | ICD-10-CM | POA: Diagnosis not present

## 2013-08-20 DIAGNOSIS — C7A010 Malignant carcinoid tumor of the duodenum: Secondary | ICD-10-CM | POA: Insufficient documentation

## 2013-08-20 DIAGNOSIS — N289 Disorder of kidney and ureter, unspecified: Secondary | ICD-10-CM | POA: Diagnosis not present

## 2013-08-20 DIAGNOSIS — E119 Type 2 diabetes mellitus without complications: Secondary | ICD-10-CM | POA: Diagnosis not present

## 2013-08-20 DIAGNOSIS — G4730 Sleep apnea, unspecified: Secondary | ICD-10-CM | POA: Insufficient documentation

## 2013-08-20 DIAGNOSIS — F3289 Other specified depressive episodes: Secondary | ICD-10-CM | POA: Insufficient documentation

## 2013-08-20 DIAGNOSIS — E559 Vitamin D deficiency, unspecified: Secondary | ICD-10-CM | POA: Diagnosis not present

## 2013-08-20 DIAGNOSIS — K319 Disease of stomach and duodenum, unspecified: Secondary | ICD-10-CM | POA: Insufficient documentation

## 2013-08-20 DIAGNOSIS — K6389 Other specified diseases of intestine: Secondary | ICD-10-CM | POA: Diagnosis not present

## 2013-08-20 DIAGNOSIS — F411 Generalized anxiety disorder: Secondary | ICD-10-CM | POA: Insufficient documentation

## 2013-08-20 HISTORY — PX: EUS: SHX5427

## 2013-08-20 LAB — GLUCOSE, CAPILLARY: GLUCOSE-CAPILLARY: 99 mg/dL (ref 70–99)

## 2013-08-20 SURGERY — UPPER ENDOSCOPIC ULTRASOUND (EUS) LINEAR
Anesthesia: Monitor Anesthesia Care

## 2013-08-20 MED ORDER — KETAMINE HCL 10 MG/ML IJ SOLN
INTRAMUSCULAR | Status: DC | PRN
  Administered 2013-08-20: 20 mg via INTRAVENOUS

## 2013-08-20 MED ORDER — LACTATED RINGERS IV SOLN
INTRAVENOUS | Status: DC
  Administered 2013-08-20: 1000 mL via INTRAVENOUS

## 2013-08-20 MED ORDER — SPOT INK MARKER SYRINGE KIT
PACK | SUBMUCOSAL | Status: AC
  Filled 2013-08-20: qty 5

## 2013-08-20 MED ORDER — SPOT INK MARKER SYRINGE KIT
PACK | SUBMUCOSAL | Status: DC | PRN
  Administered 2013-08-20: 3 mL via SUBMUCOSAL

## 2013-08-20 MED ORDER — PROPOFOL INFUSION 10 MG/ML OPTIME
INTRAVENOUS | Status: DC | PRN
  Administered 2013-08-20: 70 ug/kg/min via INTRAVENOUS

## 2013-08-20 MED ORDER — MIDAZOLAM HCL 5 MG/5ML IJ SOLN
INTRAMUSCULAR | Status: DC | PRN
  Administered 2013-08-20: 2 mg via INTRAVENOUS

## 2013-08-20 MED ORDER — SODIUM CHLORIDE 0.9 % IV SOLN
INTRAVENOUS | Status: DC
Start: 2013-08-20 — End: 2013-08-20

## 2013-08-20 NOTE — Anesthesia Postprocedure Evaluation (Signed)
  Anesthesia Post-op Note  Patient: Tiffany Ortiz  Procedure(s) Performed: Procedure(s) (LRB): UPPER ENDOSCOPIC ULTRASOUND (EUS) LINEAR (N/A)  Patient Location: PACU  Anesthesia Type: MAC  Level of Consciousness: awake and alert   Airway and Oxygen Therapy: Patient Spontanous Breathing  Post-op Pain: mild  Post-op Assessment: Post-op Vital signs reviewed, Patient's Cardiovascular Status Stable, Respiratory Function Stable, Patent Airway and No signs of Nausea or vomiting  Last Vitals:  Filed Vitals:   08/20/13 1135  BP: 137/70  Temp:   Resp: 20    Post-op Vital Signs: stable   Complications: No apparent anesthesia complications

## 2013-08-20 NOTE — Interval H&P Note (Signed)
History and Physical Interval Note:  08/20/2013 9:34 AM  Tiffany Ortiz  has presented today for surgery, with the diagnosis of Nonspecific (abnormal) findings on radiological and other examination of gastrointestinal tract [793.4]  The various methods of treatment have been discussed with the patient and family. After consideration of risks, benefits and other options for treatment, the patient has consented to  Procedure(s): UPPER ENDOSCOPIC ULTRASOUND (EUS) LINEAR (N/A) as a surgical intervention .  [name] history has been reviewed, patient examined, no change in status, stable for surgery.  I have reviewed the patient's chart and labs.  Questions were answered to the patient's satisfaction.     Milus Banister

## 2013-08-20 NOTE — H&P (View-Only) (Signed)
Review of pertinent gastrointestinal problems: 1. Chronic diarrhea: Colonoscopy Dr. Gala Romney 02/2013: pan diverticulosis, normal TI, biopsies randomly from colon were normal, a few small HPs were removed. 01/2013 celiac panel, TSH were both normal. 2. Duodenal carcinoid, spread to local LN: EGD Dr. Gala Romney 02/2013; noted incidentally in bulb of duodenum, biopsied. EUS Dr. Ardis Hughs 02/2013 confirmed MP involvement, underwent surgical resection Dr. Barry Dienes: small bowel resection 03/2013 no residual carcinoid, but one nearby LN was positive for carcinoid. 3. EUS 10/2014above also noted changes consistent with chronic pancreatitis  HPI: This is a    very pleasant 68 year old woman whom I last saw the time of an upper endoscopic ultrasound several months ago.  Diarrhea improved immediately after surgery, then it returned.  Lately still with intermittent diarrhea, constipation.  Will take periodic imodium, will take periodic stool softners.  In past few weeks has not needed imodium.  Certain veggies cause diarrhea.  Overall her weight has lost 20 pounds in past 6 months.     Review of systems: Pertinent positive and negative review of systems were noted in the above HPI section. Complete review of systems was performed and was otherwise normal.    Past Medical History  Diagnosis Date  . Fibromyalgia   . Diabetes mellitus   . Renal disorder   . Hypertension   . IBS (irritable bowel syndrome)   . Vitamin D deficiency   . Sleep apnea   . Headache(784.0)   . GERD (gastroesophageal reflux disease)   . Hyperlipidemia   . Anxiety   . Depression   . Shortness of breath     Past Surgical History  Procedure Laterality Date  . Abdominal hysterectomy      Cervical cancer, with incidental appendectomy  . Colonoscopy  09/22/2003    RMR: Diminutive rectal polyps cold biopsy/removed.  Otherwise normal rectal mucosa/Pancolonic diverticula.  The remainder of the colonic mucosa appeared normal  .  Esophagogastroduodenoscopy  11/19/2006    [id number] Schatzki's ring with overlying distal esophageal erosions consistent with erosive reflux esophagitis, status post dilation and disruption of ring as described above/ Small hiatal hernia/Otherwise normal stomach, first duodenum and second duodenum  . Bladder tack    . Breast lumpectomy      benign  . Colonoscopy with esophagogastroduodenoscopy (egd) N/A 02/04/2013    Procedure: COLONOSCOPY WITH ESOPHAGOGASTRODUODENOSCOPY (EGD);  Surgeon: Daneil Dolin, MD;  Location: [facility];  Service: Endoscopy;  Laterality: N/A;  10:15-moved to 9:30am  . Esophageal biopsy N/A 02/04/2013    Procedure: BIOPSY;  Surgeon: Daneil Dolin, MD;  Location: [facility];  Service: Endoscopy;  Laterality: N/A;  SB BX AND RANDOM COLON BX  . Eus N/A 02/12/2013    Procedure: UPPER ENDOSCOPIC ULTRASOUND (EUS) LINEAR;  Surgeon: Milus Banister, MD;  Location: [facility];  Service: Endoscopy;  Laterality: N/A;  . Appendectomy    . Tonsillectomy    . Laparoscopic small bowel resection N/A 03/31/2013    Procedure: LAPAROSCOPIC  ASSISTED CONVERTED TO OPEN RESECTION DUODENAL MASS/UPPER ENDOSCOPY;  Surgeon: Stark Klein, MD;  Location: [facility];  Service: General;  Laterality: N/A;    Current Outpatient Prescriptions  Medication Sig Dispense Refill  . ALPRAZolam (XANAX) 1 MG tablet Take 1 mg by mouth at bedtime as needed for anxiety.      Marland Kitchen amphetamine-dextroamphetamine (ADDERALL) 10 MG tablet Take 10 mg by mouth daily.      . Armodafinil (NUVIGIL) 250 MG tablet Take 250 mg by mouth daily.       Marland Kitchen  cetirizine (ZYRTEC) 10 MG tablet Take 10 mg by mouth daily.      . chlorthalidone (HYGROTON) 25 MG tablet Take 25 mg by mouth daily with breakfast.       . Cholecalciferol (VITAMIN D PO) Take 5,000 Units by mouth once a week.      Marland Kitchen dexlansoprazole (DEXILANT) 60 MG capsule Take 60 mg by mouth daily.      Marland Kitchen FLUoxetine (PROZAC) 10 MG tablet Take 10 mg by mouth daily.      Marland Kitchen  gabapentin (NEURONTIN) 300 MG capsule Take 300 mg by mouth 3 (three) times daily.      Marland Kitchen glipiZIDE (GLUCOTROL XL) 5 MG 24 hr tablet Take 5 mg by mouth daily.      . Lactobacillus (PROBIOTIC ACIDOPHILUS PO) Take 1 capsule by mouth daily.       Marland Kitchen linagliptin (TRADJENTA) 5 MG TABS tablet Take 5 mg by mouth daily.      Marland Kitchen loperamide (IMODIUM A-D) 2 MG tablet Take 1 tablet (2 mg total) by mouth 3 (three) times daily before meals.  90 tablet  0  . losartan (COZAAR) 100 MG tablet Take 100 mg by mouth daily.       Marland Kitchen oxymetazoline (AFRIN) 0.05 % nasal spray Place 1 spray into both nostrils 2 (two) times daily as needed for congestion.      . pravastatin (PRAVACHOL) 20 MG tablet Take 20 mg by mouth daily at 12 noon.      Marland Kitchen Propylene Glycol-Glycerin (SOOTHE OP) Place 1 drop into both eyes 2 (two) times daily.      . verapamil (CALAN-SR) 240 MG CR tablet Take 240 mg by mouth daily.        No current facility-administered medications for this visit.    Allergies as of 07/28/2013 - Review Complete 07/28/2013  Allergen Reaction Noted  . Other  01/28/2013  . Asa [aspirin]  02/06/2013  . Erythromycin  01/28/2013  . Famciclovir  02/11/2013  . Hydrocodone  02/04/2013  . Ibuprofen  02/06/2013  . Lactose intolerance (gi) Diarrhea and Other (See Comments) 03/31/2013  . Oxycodone  01/28/2013  . Promethazine Nausea And Vomiting 01/30/2011  . Tylenol [acetaminophen]  02/06/2013    Family History  Problem Relation Age of Onset  . Colon cancer Neg Hx   . Cancer Mother     ?ovarian  . Cancer Father     History   Social History  . Marital Status: Married    Spouse Name: N/A    Number of Children: N/A  . Years of Education: N/A   Occupational History  . Not on file.   Social History Main Topics  . Smoking status: Former Smoker    Types: Cigarettes  . Smokeless tobacco: Former Systems developer    Quit date: 02/11/1970  . Alcohol Use: No  . Drug Use: No  . Sexual Activity: Not on file   Other Topics  Concern  . 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 She had colonoscopy with a different gastroenterologist for months ago without inflammation of her colon, no microscopic colitis on biopsies. Lately she is telling me she alternates between constipation and loose stools. It does not seem she is bothered by Arbie Cookey diarrhea for the past several weeks at least. We have question of the primary site of her duodenal carcinoid, I would like to repeat EGD with endoscopic ultrasound use of the duodenum as well as the pancreas.

## 2013-08-20 NOTE — Transfer of Care (Signed)
Immediate Anesthesia Transfer of Care Note  Patient: Tiffany Ortiz  Procedure(s) Performed: Procedure(s): UPPER ENDOSCOPIC ULTRASOUND (EUS) LINEAR (N/A)  Patient Location: PACU  Anesthesia Type:MAC  Level of Consciousness: sedated  Airway & Oxygen Therapy: Patient Spontanous Breathing and Patient connected to nasal cannula oxygen  Post-op Assessment: Report given to PACU RN and Post -op Vital signs reviewed and stable  Post vital signs: Reviewed and stable  Complications: No apparent anesthesia complications

## 2013-08-20 NOTE — Anesthesia Preprocedure Evaluation (Signed)
Anesthesia Evaluation  Patient identified by MRN, date of birth, ID band Patient awake    Reviewed: Allergy & Precautions, H&P , NPO status , Patient's Chart, lab work & pertinent test results  Airway Mallampati: II TM Distance: >3 FB Neck ROM: full  Mouth opening: Limited Mouth Opening  Dental no notable dental hx. (+) Dental Advisory Given   Pulmonary shortness of breath and with exertion, sleep apnea and Continuous Positive Airway Pressure Ventilation , former smoker,  breath sounds clear to auscultation  Pulmonary exam normal       Cardiovascular Exercise Tolerance: Good hypertension, Pt. on medications Rhythm:regular Rate:Normal     Neuro/Psych  Headaches, PSYCHIATRIC DISORDERS Anxiety Depression    GI/Hepatic Neg liver ROS, GERD-  Medicated,  Endo/Other  diabetes, Well Controlled, Type 2, Oral Hypoglycemic Agents  Renal/GU Renal disease     Musculoskeletal  (+) Fibromyalgia -  Abdominal (+) + obese,   Peds  Hematology negative hematology ROS (+)   Anesthesia Other Findings   Reproductive/Obstetrics negative OB ROS                           Anesthesia Physical  Anesthesia Plan  ASA: III  Anesthesia Plan: MAC   Post-op Pain Management:    Induction:   Airway Management Planned:   Additional Equipment:   Intra-op Plan:   Post-operative Plan:   Informed Consent: I have reviewed the patients History and Physical, chart, labs and discussed the procedure including the risks, benefits and alternatives for the proposed anesthesia with the patient or authorized representative who has indicated his/her understanding and acceptance.   Dental Advisory Given  Plan Discussed with: CRNA  Anesthesia Plan Comments:         Anesthesia Quick Evaluation

## 2013-08-20 NOTE — Op Note (Signed)
[facility] [place], [id number]   ENDOSCOPIC ULTRASOUND PROCEDURE REPORT  PATIENT: Tiffany, Ortiz  MR#: [id number] BIRTHDATE: 03-12-1946  GENDER: Female ENDOSCOPIST: Milus Banister, MD PROCEDURE DATE:  08/20/2013 PROCEDURE:   Upper EUS , EGD with Biopsy, EGD with submucosal injection ASA CLASS:      Class III INDICATIONS:   previous duodenal bulb carcinoid, s/p duodenal wedge resection/+nearby lymphnode with metastatic NE cells. MEDICATIONS: MAC sedation, administered by CRNA  DESCRIPTION OF PROCEDURE:   After the risks benefits and alternatives of the procedure were  explained, informed consent was obtained. The patient was then placed in the left, lateral, decubitus postion and IV sedation was administered. Throughout the procedure, the patients blood pressure, pulse and oxygen saturations were monitored continuously.  Under direct visualization, the Pentax Linear [id number]  endoscope was introduced through the mouth  and advanced to the second portion of the duodenum .  Water was used as necessary to provide an acoustic interface.  Upon completion of the imaging, water was removed and the patient was sent to the recovery room in satisfactory condition.   COMPLICATIONS: There were no complications. ENDOSCOPIC VISUALIZATION: The surgical site/scar was clearly visible in very distal duodenal bulb. There was some reactive appearing mucosa at the site and it was biopsied (path jar 1).  About 1-2cm proximal to the surgical site there was a 1cm nodule that looks similar to the previously noted duodenal bulb carcinoid. This site was extensively biospied (path jar 2) and then it was injected with Niger Ink and finally the nodule was endoclipped with a single clip to aid in future localization if needed.  ULTRASONIC VISUALIZATION: The doudenal bulb nodule described above correlatee with an 46mm hypoechoic lesion that involves the mucosa/deep  mucosal layers of the duodenal wall.   ENDOSCOPIC IMPRESSION: Possible residual/recurrent carcinoid nodule in the duodenal bulb about 1-2cm proximal to the previous surgical site/scar. Biopsies were taken extensively from the surgical site as well as the slightly more proximal nodule (sent in separate path jars).  The nodule was labeled with Niger Ink and a single endoclip.  RECOMMENDATIONS: Await final pathology  _______________________________ eSigned:  Milus Banister, MD 08/20/2013 11:26 AM cc: Stark Klein, MD;  Julieanne Manson, MD

## 2013-08-20 NOTE — Discharge Instructions (Signed)

## 2013-08-21 ENCOUNTER — Encounter (HOSPITAL_COMMUNITY): Payer: Self-pay | Admitting: Gastroenterology

## 2013-08-29 NOTE — Progress Notes (Signed)
I am hesitant to operate on her again since I helped you with it & came back ?missed.  I thought we had a great view.Marland KitchenMarland Kitchen

## 2013-09-01 DIAGNOSIS — E119 Type 2 diabetes mellitus without complications: Secondary | ICD-10-CM | POA: Diagnosis not present

## 2013-09-01 DIAGNOSIS — I10 Essential (primary) hypertension: Secondary | ICD-10-CM | POA: Diagnosis not present

## 2013-09-03 DIAGNOSIS — E119 Type 2 diabetes mellitus without complications: Secondary | ICD-10-CM | POA: Diagnosis not present

## 2013-09-03 DIAGNOSIS — E876 Hypokalemia: Secondary | ICD-10-CM | POA: Diagnosis not present

## 2013-09-03 DIAGNOSIS — I10 Essential (primary) hypertension: Secondary | ICD-10-CM | POA: Diagnosis not present

## 2013-09-03 DIAGNOSIS — C7A010 Malignant carcinoid tumor of the duodenum: Secondary | ICD-10-CM | POA: Diagnosis not present

## 2013-09-11 DIAGNOSIS — B351 Tinea unguium: Secondary | ICD-10-CM | POA: Diagnosis not present

## 2013-09-11 DIAGNOSIS — M2040 Other hammer toe(s) (acquired), unspecified foot: Secondary | ICD-10-CM | POA: Diagnosis not present

## 2013-09-11 DIAGNOSIS — M2010 Hallux valgus (acquired), unspecified foot: Secondary | ICD-10-CM | POA: Diagnosis not present

## 2013-09-11 DIAGNOSIS — M79609 Pain in unspecified limb: Secondary | ICD-10-CM | POA: Diagnosis not present

## 2013-09-14 ENCOUNTER — Other Ambulatory Visit: Payer: Self-pay

## 2013-09-14 ENCOUNTER — Telehealth: Payer: Self-pay | Admitting: Gastroenterology

## 2013-09-14 DIAGNOSIS — D3A00 Benign carcinoid tumor of unspecified site: Secondary | ICD-10-CM

## 2013-09-14 NOTE — Telephone Encounter (Signed)
The pt will speak with Dr Barry Dienes and Dr Gala Romney and decide if she wants the [facility] referral and call back

## 2013-09-14 NOTE — Telephone Encounter (Signed)
I will be glad to speak with the patient

## 2013-09-14 NOTE — Telephone Encounter (Signed)
I did not hear back about second opinion from [place].  Lets refer her to [facility].  Dr. Franki Cabot to consider repeat duodenal surgery for carcinoid tumor of duodenum.  Let her know we will set up referral.

## 2013-09-14 NOTE — Telephone Encounter (Signed)
Ok.  I will forward to both of them.

## 2013-09-14 NOTE — Telephone Encounter (Signed)
Dr Ardis Hughs have you spoken to Dr Barry Dienes about the second opinion for this pt?  She has not heard from [facility].  I do not see where an appt has been scheduled.

## 2013-09-16 ENCOUNTER — Telehealth (INDEPENDENT_AMBULATORY_CARE_PROVIDER_SITE_OTHER): Payer: Self-pay

## 2013-09-16 NOTE — Telephone Encounter (Signed)
Returning daughter's call.  Please ask for Tiffany Ortiz.

## 2013-09-22 ENCOUNTER — Encounter (INDEPENDENT_AMBULATORY_CARE_PROVIDER_SITE_OTHER): Payer: Medicare Other | Admitting: General Surgery

## 2013-09-25 ENCOUNTER — Telehealth: Payer: Self-pay | Admitting: Gastroenterology

## 2013-09-25 NOTE — Telephone Encounter (Signed)
The pts son wants the pt to be referred to [facility].  I will send that in and call the pt will appt as soon as available

## 2013-09-25 NOTE — Telephone Encounter (Signed)
Left message on machine to call back  

## 2013-09-25 NOTE — Telephone Encounter (Signed)
Per our last conversation the pt was going to talk to [place] and Dr Gala Romney about the [facility] referral and call back if she indeed wanted to proceed, I have not heard back from her Left message on machine to call back

## 2013-09-30 ENCOUNTER — Telehealth: Payer: Self-pay

## 2013-09-30 NOTE — Telephone Encounter (Signed)
Patient called to inquire about her appointment at [facility].  I told her according to Patty's notes the notes were faxed to them today and she will be contacted directly by them.  I told her if she doesn't hear anything back from them by next week to call us back.

## 2013-09-30 NOTE — Telephone Encounter (Signed)
Records faxed to [facility] for review and the pt will be called directly with appt

## 2013-10-12 ENCOUNTER — Other Ambulatory Visit: Payer: Medicare Other

## 2013-10-13 DIAGNOSIS — C7A019 Malignant carcinoid tumor of the small intestine, unspecified portion: Secondary | ICD-10-CM | POA: Diagnosis not present

## 2013-10-13 DIAGNOSIS — D3A010 Benign carcinoid tumor of the duodenum: Secondary | ICD-10-CM | POA: Diagnosis not present

## 2013-10-19 ENCOUNTER — Other Ambulatory Visit: Payer: Self-pay | Admitting: Oncology

## 2013-10-19 ENCOUNTER — Ambulatory Visit (HOSPITAL_BASED_OUTPATIENT_CLINIC_OR_DEPARTMENT_OTHER): Payer: Medicare Other | Admitting: Oncology

## 2013-10-19 ENCOUNTER — Telehealth: Payer: Self-pay | Admitting: Oncology

## 2013-10-19 VITALS — BP 150/60 | HR 53 | Temp 98.3°F | Resp 18 | Ht 63.0 in | Wt 154.3 lb

## 2013-10-19 DIAGNOSIS — E119 Type 2 diabetes mellitus without complications: Secondary | ICD-10-CM | POA: Diagnosis not present

## 2013-10-19 DIAGNOSIS — C7A010 Malignant carcinoid tumor of the duodenum: Secondary | ICD-10-CM | POA: Diagnosis not present

## 2013-10-19 DIAGNOSIS — I10 Essential (primary) hypertension: Secondary | ICD-10-CM | POA: Diagnosis not present

## 2013-10-19 DIAGNOSIS — R197 Diarrhea, unspecified: Secondary | ICD-10-CM

## 2013-10-19 DIAGNOSIS — D3A098 Benign carcinoid tumors of other sites: Secondary | ICD-10-CM

## 2013-10-19 NOTE — Progress Notes (Signed)
  [place] OFFICE PROGRESS NOTE   Diagnosis: Carcinoid tumor  INTERVAL HISTORY:   Ms. Ahmad returns as scheduled. She underwent a repeat endoscopy by Dr. Ardis Hughs 08/20/2013 and the surgical scar was visible in the distal duodenal bulb. The area was biopsied. Proximal to the surgical site a 1 cm nodule was noted. This area was biopsied and tattooed. A clip was placed. The pathology [id number]) revealed no tumor at the duodenal scar. A low-grade neuroendocrine tumor was confirmed at the duodenal bulb biopsy.  She saw Dr. Eugenia Pancoast to discuss treatment options. He is considering surgical versus endoscopic resection. Ms. Alegria has multiple complaints including reflux symptoms, intermittent nausea, and hot spells. She continues to have intermittent diarrhea, but this has improved since she underwent surgery last year. She relates this to a change in her diet. She does not have consistent diarrhea  Objective:  Vital signs in last 24 hours:  Blood pressure 150/60, pulse 53, temperature 98.3 F (36.8 C), temperature source Oral, resp. rate 18, height 5\' 3"  (1.6 m), weight 154 lb 4.8 oz (69.99 kg), SpO2 99.00%.    HEENT: Neck without mass Lymphatics: No cervical, supraclavicular, or axillary nodes Resp: Lungs clear bilaterally Cardio: Regular rate and rhythm, no murmur GI: No hepatomegaly, no mass, tender at the costal margin bilaterally Vascular: No leg edema  Skin: Erythematous slightly raised rash at the right side of the lower neck, areas of flat hyperpigmentation over the back  Portacath/PICC-without erythema  Lab Results: 06/12/2013-24-hour urine 5 HIAA-2.8 05/18/2013-chromogranin A-43  Medications: I have reviewed the patient's current medications.  Assessment/Plan: 1. 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, clinical stage III (uT2, pN1), status post an endoscopic biopsy 02/04/2013 confirming a duodenal carcinoid  tumor, status post a duodenal resection 03/31/2013 with no remaining tumor found in the duodenum and a positive portal lymph node   Repeat upper endoscopy/EUS on 08/21/2011 confirming a persistent duodenal nodule with a biopsy confirming a low-grade neuroendocrine carcinoma 2. Diarrhea-etiology unclear, potentially related to the carcinoid tumor  3. Diabetes  4. Sleep apnea  5. Fibromyalgia  6. Hypertension  7. Gastroesophageal reflux disease  8. Status post dilatation of a Schatzki's ring 02/04/2013    Disposition:  Ms. Royall has been diagnosed with a persistent carcinoid tumor involving the duodenum. She saw Dr. Eugenia Pancoast to discuss surgical options. I discussed the case with him by telephone on 10/16/2013. He is considering repeat surgery versus endoscopic resection of the tumor. He plans to discuss the case with Dr. Ardis Hughs.  I have a low clinical suspicion for carcinoid syndrome in her case. She will hold the dexilant and return for a repeat chromogranin A level on 10/26/2013.  I will defer further imaging to Drs. Howerton and Ardis Hughs prior to surgery.  I see no role for systemic therapy or radiation at present.  Ms. Peake will return for an office visit in 3 months.  Betsy Coder, MD  10/19/2013  1:55 PM

## 2013-10-19 NOTE — Telephone Encounter (Signed)
, °

## 2013-10-23 ENCOUNTER — Telehealth: Payer: Self-pay | Admitting: Oncology

## 2013-10-23 DIAGNOSIS — I10 Essential (primary) hypertension: Secondary | ICD-10-CM | POA: Diagnosis not present

## 2013-10-23 DIAGNOSIS — R809 Proteinuria, unspecified: Secondary | ICD-10-CM | POA: Diagnosis not present

## 2013-10-23 DIAGNOSIS — D649 Anemia, unspecified: Secondary | ICD-10-CM | POA: Diagnosis not present

## 2013-10-23 DIAGNOSIS — N189 Chronic kidney disease, unspecified: Secondary | ICD-10-CM | POA: Diagnosis not present

## 2013-10-23 DIAGNOSIS — E559 Vitamin D deficiency, unspecified: Secondary | ICD-10-CM | POA: Diagnosis not present

## 2013-10-23 DIAGNOSIS — Z79899 Other long term (current) drug therapy: Secondary | ICD-10-CM | POA: Diagnosis not present

## 2013-10-23 NOTE — Telephone Encounter (Signed)
, °

## 2013-10-26 ENCOUNTER — Ambulatory Visit (INDEPENDENT_AMBULATORY_CARE_PROVIDER_SITE_OTHER): Payer: Medicare Other | Admitting: General Surgery

## 2013-10-27 ENCOUNTER — Telehealth: Payer: Self-pay | Admitting: Gastroenterology

## 2013-10-27 DIAGNOSIS — C7A019 Malignant carcinoid tumor of the small intestine, unspecified portion: Secondary | ICD-10-CM | POA: Diagnosis not present

## 2013-10-27 NOTE — Telephone Encounter (Signed)
Received 6 pages from [facility], sent to Dr. Ardis Hughs. 10/27/13/ss.

## 2013-10-28 DIAGNOSIS — I509 Heart failure, unspecified: Secondary | ICD-10-CM | POA: Diagnosis not present

## 2013-10-28 DIAGNOSIS — N182 Chronic kidney disease, stage 2 (mild): Secondary | ICD-10-CM | POA: Diagnosis not present

## 2013-10-28 DIAGNOSIS — R809 Proteinuria, unspecified: Secondary | ICD-10-CM | POA: Diagnosis not present

## 2013-10-28 DIAGNOSIS — I10 Essential (primary) hypertension: Secondary | ICD-10-CM | POA: Diagnosis not present

## 2013-10-28 DIAGNOSIS — E1129 Type 2 diabetes mellitus with other diabetic kidney complication: Secondary | ICD-10-CM | POA: Diagnosis not present

## 2013-11-02 ENCOUNTER — Telehealth: Payer: Self-pay | Admitting: Oncology

## 2013-11-02 ENCOUNTER — Other Ambulatory Visit: Payer: Self-pay | Admitting: *Deleted

## 2013-11-02 NOTE — Progress Notes (Signed)
Received note from "Call A Nurse" today from 10/29/13 call. Requesting to reschedule her missed lab appointment from 10/26/13 and refill on [place]. Needs to have testing done without Dexilant onboard. POF to scheduler to get lab done this week.

## 2013-11-02 NOTE — Telephone Encounter (Signed)
S/w the pt and she is aware of her lab appt.

## 2013-11-03 ENCOUNTER — Telehealth: Payer: Self-pay | Admitting: *Deleted

## 2013-11-03 NOTE — Telephone Encounter (Signed)
Confirmed lab for tomorrow and rationale for the testing. Reminded her she should continue to hold her Dexilant until the blood is drawn and she returns the 24 hour urine. Asking if Dr. Benay Spice will reorder her Dexilant 60 mg daily? Uses [facility].

## 2013-11-04 ENCOUNTER — Other Ambulatory Visit: Payer: Medicare Other

## 2013-11-04 DIAGNOSIS — C7A010 Malignant carcinoid tumor of the duodenum: Secondary | ICD-10-CM | POA: Diagnosis not present

## 2013-11-10 ENCOUNTER — Other Ambulatory Visit: Payer: Self-pay | Admitting: *Deleted

## 2013-11-10 ENCOUNTER — Telehealth: Payer: Self-pay | Admitting: *Deleted

## 2013-11-10 ENCOUNTER — Telehealth: Payer: Self-pay | Admitting: Oncology

## 2013-11-10 DIAGNOSIS — C7A010 Malignant carcinoid tumor of the duodenum: Secondary | ICD-10-CM | POA: Diagnosis not present

## 2013-11-10 NOTE — Telephone Encounter (Signed)
Orders entered for schedulers to contact pt with office visit appt. Spoke with pt, she will return 24 hour urine results today.

## 2013-11-10 NOTE — Telephone Encounter (Signed)
cld & left pt a message to adv of appt sch 7/27-will mail pt copy of sch

## 2013-11-10 NOTE — Telephone Encounter (Signed)
Message copied by Brien Few on Tue Nov 10, 2013 12:42 PM ------      Message from: Betsy Coder B      Created: Mon Nov 09, 2013  8:19 PM       Needs appt. Here after labs next 2-3 weeks.  Dr. Eugenia Pancoast called and she wants to f/u here.             ------

## 2013-11-11 ENCOUNTER — Encounter: Payer: Self-pay | Admitting: Internal Medicine

## 2013-11-11 ENCOUNTER — Other Ambulatory Visit: Payer: Self-pay | Admitting: *Deleted

## 2013-11-11 LAB — CHROMOGRANIN A: CHROMOGRANIN A: 23 ng/mL — AB (ref 1.9–15.0)

## 2013-11-11 MED ORDER — DEXLANSOPRAZOLE 60 MG PO CPDR: 60.0000 mg | DELAYED_RELEASE_CAPSULE | Freq: Every day | ORAL | Status: DC

## 2013-11-12 ENCOUNTER — Telehealth: Payer: Self-pay | Admitting: Gastroenterology

## 2013-11-12 ENCOUNTER — Telehealth: Payer: Self-pay

## 2013-11-12 NOTE — Telephone Encounter (Signed)
Called patient and informed her per Dr.Sherrill her Chromagranin A level was mildly elevated and to f/u as scheduled. Patient denies any questions or concerns at this time. Confirmed appointment on 11/30/13 at 1415 with Ned Card, NP to f/u.

## 2013-11-12 NOTE — Telephone Encounter (Signed)
Received 4 pages from [facility], sent to Dr. Ardis Hughs. 11/12/13/ss

## 2013-11-13 LAB — 5 HIAA, QUANTITATIVE, URINE, 24 HOUR
5-HIAA, 24 Hr Urine: 3.3 mg/24 h (ref <=6.0)
VOLUME, URINE-5HIAA: 1500 mL/(24.h)

## 2013-11-30 ENCOUNTER — Telehealth: Payer: Self-pay | Admitting: Oncology

## 2013-11-30 ENCOUNTER — Ambulatory Visit (HOSPITAL_BASED_OUTPATIENT_CLINIC_OR_DEPARTMENT_OTHER): Payer: Medicare Other | Admitting: Nurse Practitioner

## 2013-11-30 VITALS — BP 149/71 | HR 77 | Temp 98.7°F | Resp 18 | Ht 63.0 in | Wt 152.8 lb

## 2013-11-30 DIAGNOSIS — R197 Diarrhea, unspecified: Secondary | ICD-10-CM | POA: Diagnosis not present

## 2013-11-30 DIAGNOSIS — E119 Type 2 diabetes mellitus without complications: Secondary | ICD-10-CM | POA: Diagnosis not present

## 2013-11-30 DIAGNOSIS — C7A010 Malignant carcinoid tumor of the duodenum: Secondary | ICD-10-CM

## 2013-11-30 DIAGNOSIS — D3A098 Benign carcinoid tumors of other sites: Secondary | ICD-10-CM

## 2013-11-30 DIAGNOSIS — I10 Essential (primary) hypertension: Secondary | ICD-10-CM | POA: Diagnosis not present

## 2013-11-30 NOTE — Telephone Encounter (Signed)
gv and printed appt sched and avs for ptf or Nov

## 2013-11-30 NOTE — Progress Notes (Addendum)
  [facility] Ort[facility] OFFICE PROGRESS NOTE   Diagnosis:  Carcinoid tumor.  INTERVAL HISTORY:   Tiffany Ortiz returns as scheduled. She has intermittent loose stools which tend to be diet related. She has periodic abdominal pain. The pain does not occur on a daily basis. She is nauseated at times. No consistent flushing episodes.  Objective:  Vital signs in last 24 hours:  Blood pressure 149/71, pulse 77, temperature 98.7 F (37.1 C), temperature source Oral, resp. rate 18, height 5\' 3"  (1.6 m), weight 152 lb 12.8 oz (69.31 kg), SpO2 100.00%.    HEENT: No thrush or ulceration. Resp: Lungs clear. Cardio: Regular cardiac rhythm. GI: Abdomen is soft. Tenderness at the epigastric region. No mass. No hepatomegaly. Vascular: No leg edema.   Lab Results:  Lab Results  Component Value Date   WBC 9.9 04/05/2013   HGB 10.7* 04/05/2013   HCT 31.9* 04/05/2013   MCV 87.9 04/05/2013   PLT 307 04/05/2013   11/04/2013 chromogranin A 23; 24-hour urine 5 HIAA 3.3  Imaging:  No results found.  Medications: I have reviewed the patient's current medications.  Assessment/Plan: 1. 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, clinical stage III (uT2, pN1), status post an endoscopic biopsy 02/04/2013 confirming a duodenal carcinoid tumor, status post a duodenal resection 03/31/2013 with no remaining tumor found in the duodenum and a positive portal lymph node  Repeat upper endoscopy/EUS on 08/20/2013 confirming a persistent duodenal nodule with a biopsy confirming a low-grade neuroendocrine carcinoma 2. Diarrhea-etiology unclear, potentially related to the carcinoid tumor.  3. Diabetes  4. Sleep apnea  5. Fibromyalgia  6. Hypertension  7. Gastroesophageal reflux disease  8. Status post dilatation of a Schatzki's ring 02/04/2013    Disposition: Tiffany Ortiz appears stable. She has seen Dr. Eugenia Pancoast at [facility] regarding surgery. She has elected  observation at this time.  The recent chromogranin A level was mildly elevated but improved as compared to January 2015. We will continue to follow.  She has intermittent diarrhea which seems to be diet related. We have a low clinical suspicion for carcinoid syndrome.  She will return for a followup visit in 4 months with a repeat chromogranin A level one week prior. She will contact the office in the interim with any problems.  Patient seen with Dr. Benay Spice. 25 minutes were spent face-to-face at today's visit with the majority of that time involved in counseling/coordination of care.    Ned Card ANP/GNP-BC   11/30/2013  5:04 PM This was a shared visit with Ned Card.  Tiffany Ortiz was interviewed and examined. I have a low clinical suspicion for progression of the carcinoid tumor or carcinoid syndrome. I doubt her intermittent gastrointestinal symptoms are related to the carcinoid tumor. I discussed the case with Dr. Eugenia Pancoast by telephone last month. The plan is to follow her with observation.  She will return for an office visit and repeat chromogranin A level in 4 months. We will consider obtaining a CT scan if she develops clinical evidence of progressive carcinoid disease.  Julieanne Manson, M.D.

## 2013-12-09 DIAGNOSIS — I10 Essential (primary) hypertension: Secondary | ICD-10-CM | POA: Diagnosis not present

## 2013-12-09 DIAGNOSIS — E119 Type 2 diabetes mellitus without complications: Secondary | ICD-10-CM | POA: Diagnosis not present

## 2013-12-11 DIAGNOSIS — E782 Mixed hyperlipidemia: Secondary | ICD-10-CM | POA: Diagnosis not present

## 2013-12-11 DIAGNOSIS — I10 Essential (primary) hypertension: Secondary | ICD-10-CM | POA: Diagnosis not present

## 2013-12-11 DIAGNOSIS — C7A010 Malignant carcinoid tumor of the duodenum: Secondary | ICD-10-CM | POA: Diagnosis not present

## 2013-12-11 DIAGNOSIS — E119 Type 2 diabetes mellitus without complications: Secondary | ICD-10-CM | POA: Diagnosis not present

## 2014-01-05 DIAGNOSIS — E119 Type 2 diabetes mellitus without complications: Secondary | ICD-10-CM | POA: Diagnosis not present

## 2014-01-05 DIAGNOSIS — I10 Essential (primary) hypertension: Secondary | ICD-10-CM | POA: Diagnosis not present

## 2014-01-05 DIAGNOSIS — D72829 Elevated white blood cell count, unspecified: Secondary | ICD-10-CM | POA: Diagnosis not present

## 2014-01-05 DIAGNOSIS — E559 Vitamin D deficiency, unspecified: Secondary | ICD-10-CM | POA: Diagnosis not present

## 2014-01-26 ENCOUNTER — Ambulatory Visit: Payer: Medicare Other | Admitting: Oncology

## 2014-01-26 ENCOUNTER — Other Ambulatory Visit: Payer: Medicare Other

## 2014-01-29 DIAGNOSIS — I10 Essential (primary) hypertension: Secondary | ICD-10-CM | POA: Diagnosis not present

## 2014-01-29 DIAGNOSIS — E119 Type 2 diabetes mellitus without complications: Secondary | ICD-10-CM | POA: Diagnosis not present

## 2014-01-29 DIAGNOSIS — L0233 Carbuncle of buttock: Secondary | ICD-10-CM | POA: Diagnosis not present

## 2014-01-29 DIAGNOSIS — R110 Nausea: Secondary | ICD-10-CM | POA: Diagnosis not present

## 2014-02-19 ENCOUNTER — Other Ambulatory Visit: Payer: Self-pay | Admitting: Oncology

## 2014-02-19 DIAGNOSIS — K529 Noninfective gastroenteritis and colitis, unspecified: Secondary | ICD-10-CM

## 2014-02-19 DIAGNOSIS — C189 Malignant neoplasm of colon, unspecified: Secondary | ICD-10-CM

## 2014-03-04 DIAGNOSIS — Z23 Encounter for immunization: Secondary | ICD-10-CM | POA: Diagnosis not present

## 2014-03-08 ENCOUNTER — Other Ambulatory Visit: Payer: Self-pay | Admitting: *Deleted

## 2014-03-08 DIAGNOSIS — D3A098 Benign carcinoid tumors of other sites: Secondary | ICD-10-CM

## 2014-03-09 ENCOUNTER — Other Ambulatory Visit: Payer: Medicare Other

## 2014-03-09 ENCOUNTER — Telehealth: Payer: Self-pay | Admitting: Oncology

## 2014-03-09 NOTE — Telephone Encounter (Signed)
Pt called to r/s labs 11/03 to 11/04 per pt she didn't have transportation, pt confirmed labs .... KJ

## 2014-03-10 ENCOUNTER — Other Ambulatory Visit: Payer: Medicare Other

## 2014-03-10 DIAGNOSIS — C189 Malignant neoplasm of colon, unspecified: Secondary | ICD-10-CM | POA: Diagnosis not present

## 2014-03-10 DIAGNOSIS — D3A098 Benign carcinoid tumors of other sites: Secondary | ICD-10-CM

## 2014-03-10 DIAGNOSIS — D490 Neoplasm of unspecified behavior of digestive system: Secondary | ICD-10-CM | POA: Diagnosis not present

## 2014-03-15 ENCOUNTER — Telehealth: Payer: Self-pay | Admitting: Oncology

## 2014-03-15 NOTE — Telephone Encounter (Signed)
Pt called lft msg to confirm labs/ov, called pt back no answer or vm, will try back in the morning..... KJ

## 2014-03-16 ENCOUNTER — Ambulatory Visit (HOSPITAL_BASED_OUTPATIENT_CLINIC_OR_DEPARTMENT_OTHER): Payer: Medicare Other | Admitting: Oncology

## 2014-03-16 VITALS — BP 141/61 | HR 57 | Temp 97.8°F | Resp 18 | Ht 63.0 in | Wt 156.2 lb

## 2014-03-16 DIAGNOSIS — E119 Type 2 diabetes mellitus without complications: Secondary | ICD-10-CM

## 2014-03-16 DIAGNOSIS — D3A098 Benign carcinoid tumors of other sites: Secondary | ICD-10-CM

## 2014-03-16 DIAGNOSIS — M797 Fibromyalgia: Secondary | ICD-10-CM

## 2014-03-16 DIAGNOSIS — R197 Diarrhea, unspecified: Secondary | ICD-10-CM

## 2014-03-16 DIAGNOSIS — I10 Essential (primary) hypertension: Secondary | ICD-10-CM | POA: Diagnosis not present

## 2014-03-16 DIAGNOSIS — C7A010 Malignant carcinoid tumor of the duodenum: Secondary | ICD-10-CM

## 2014-03-16 DIAGNOSIS — K219 Gastro-esophageal reflux disease without esophagitis: Secondary | ICD-10-CM

## 2014-03-16 LAB — CHROMOGRANIN A: Chromogranin A: 123 ng/mL — ABNORMAL HIGH (ref <=15)

## 2014-03-16 NOTE — Progress Notes (Signed)
  T[facility] Ort[facility] OFFICE PROGRESS NOTE   Diagnosis: carcinoid tumor  INTERVAL HISTORY:   Tiffany Ortiz returns as scheduled.She has multiple complaints including depression,a rash, soreness in the mouth, and intermittent periumbilical pain. She only has diarrhea when eating "greens ". She reports a poor appetite. Her dentist gave her a mouthwash and this did not help. No fever.  Objective:  Vital signs in last 24 hours:  Blood pressure 141/61, pulse 57, temperature 97.8 F (36.6 C), temperature source Oral, resp. rate 18, height 5\' 3"  (1.6 m), weight 156 lb 3.2 oz (70.852 kg).    HEENT: oral cavity without thrush or ulcers Lymphatics: no cervical, supra-clavicular, axillary, or inguinal nodes Resp: lungs clear bilaterally Cardio: regular rate and rhythm GI: no hepatomegaly, nontender, no mass Vascular: no leg edema  Skin:vitiligo over the back, hyperpigmentation at the right lower neck with a plaque-like area, plaque-like hyperpigmented area over the left cheek     Lab Results: Chromogranin A on 11/04/2013 -23 24 hour urine 5 HIAA on 11/10/2013-3.3  Chromogranin A from 03/10/2014-pending   Medications: I have reviewed the patient's current medications.  Assessment/Plan: 1. 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, clinical stage III (uT2, pN1), status post an endoscopic biopsy 02/04/2013 confirming a duodenal carcinoid tumor, status post a duodenal resection 03/31/2013 with no remaining tumor found in the duodenum and a positive portal lymph node   Repeat upper endoscopy/EUS on 08/20/2013 confirming a persistent duodenal nodule with a biopsy confirming a low-grade neuroendocrine carcinoma 2. Diarrhea-intermittent, diet related-I doubt this is related to carcinoid disease  3. Diabetes  4. Sleep apnea  5. Fibromyalgia  6. Hypertension  7. Gastroesophageal reflux disease  8. Status post dilatation of a Schatzki's  ring 02/04/2013  9. Vitiligo and hyperpigmented skin rash-we will make a dermatology referral    Disposition:  There is no clinical evidence for progression of the carcinoid tumor or development of carcinoid syndrome. We will follow-up on the chromogranin A level from last week. [name] will return for an office visit in 4 months.  Tiffany Coder, MD  03/16/2014  11:44 AM

## 2014-03-17 ENCOUNTER — Telehealth: Payer: Self-pay | Admitting: Oncology

## 2014-03-17 NOTE — Telephone Encounter (Signed)
Confirm appt d/t for March 2016. Also set up referral to Derm for Nov 25 @12 :20. Will have office notes and demo information faxed to [phone].

## 2014-03-18 ENCOUNTER — Telehealth: Payer: Self-pay | Admitting: *Deleted

## 2014-03-18 ENCOUNTER — Other Ambulatory Visit: Payer: Self-pay | Admitting: *Deleted

## 2014-03-18 DIAGNOSIS — D3A098 Benign carcinoid tumors of other sites: Secondary | ICD-10-CM

## 2014-03-18 NOTE — Telephone Encounter (Signed)
Called pt with Chromagranin results. She reports she was not taking Dexilant when test was done. May have taken Pepto-Bismol or Imodium in the days prior to the lab test. Had been off it about a month. Also wants to remind MD that she saw Dr. Eugenia Pancoast at [facility] for consultation. If she had to have surgery she'd want to see him.

## 2014-03-18 NOTE — Telephone Encounter (Signed)
-----   Message from Ladell Pier, MD sent at 03/17/2014  5:46 PM EST ----- Please call patient, chromagranin A is higher, did she stop dexilant 5 days prior to lab?, repeat 2 months stopping dexilant for 5 days

## 2014-03-19 ENCOUNTER — Telehealth: Payer: Self-pay | Admitting: Oncology

## 2014-03-19 NOTE — Telephone Encounter (Signed)
, °

## 2014-04-07 DIAGNOSIS — H2513 Age-related nuclear cataract, bilateral: Secondary | ICD-10-CM | POA: Diagnosis not present

## 2014-04-07 DIAGNOSIS — H04123 Dry eye syndrome of bilateral lacrimal glands: Secondary | ICD-10-CM | POA: Diagnosis not present

## 2014-04-07 DIAGNOSIS — E119 Type 2 diabetes mellitus without complications: Secondary | ICD-10-CM | POA: Diagnosis not present

## 2014-04-07 DIAGNOSIS — H02834 Dermatochalasis of left upper eyelid: Secondary | ICD-10-CM | POA: Diagnosis not present

## 2014-04-09 DIAGNOSIS — L309 Dermatitis, unspecified: Secondary | ICD-10-CM | POA: Diagnosis not present

## 2014-04-09 DIAGNOSIS — E119 Type 2 diabetes mellitus without complications: Secondary | ICD-10-CM | POA: Diagnosis not present

## 2014-04-09 DIAGNOSIS — L819 Disorder of pigmentation, unspecified: Secondary | ICD-10-CM | POA: Diagnosis not present

## 2014-04-09 DIAGNOSIS — L821 Other seborrheic keratosis: Secondary | ICD-10-CM | POA: Diagnosis not present

## 2014-04-09 DIAGNOSIS — I10 Essential (primary) hypertension: Secondary | ICD-10-CM | POA: Diagnosis not present

## 2014-04-09 DIAGNOSIS — B360 Pityriasis versicolor: Secondary | ICD-10-CM | POA: Diagnosis not present

## 2014-04-12 DIAGNOSIS — N183 Chronic kidney disease, stage 3 (moderate): Secondary | ICD-10-CM | POA: Diagnosis not present

## 2014-04-12 DIAGNOSIS — E1122 Type 2 diabetes mellitus with diabetic chronic kidney disease: Secondary | ICD-10-CM | POA: Diagnosis not present

## 2014-04-12 DIAGNOSIS — E1165 Type 2 diabetes mellitus with hyperglycemia: Secondary | ICD-10-CM | POA: Diagnosis not present

## 2014-04-12 DIAGNOSIS — M5430 Sciatica, unspecified side: Secondary | ICD-10-CM | POA: Diagnosis not present

## 2014-04-13 ENCOUNTER — Other Ambulatory Visit: Payer: Self-pay | Admitting: Oncology

## 2014-04-13 DIAGNOSIS — C189 Malignant neoplasm of colon, unspecified: Secondary | ICD-10-CM

## 2014-04-16 ENCOUNTER — Telehealth: Payer: Self-pay | Admitting: *Deleted

## 2014-04-16 NOTE — Telephone Encounter (Signed)
Left VM in regards to increase in her chromogranin and what this means. Also asking if OK to have sex with her husband? Will be going out, but OK to leave a detailed message on her machine.

## 2014-04-16 NOTE — Telephone Encounter (Signed)
Left patient VM that lab is used to monitor the status of her carcinoid diagnosis. MD wants to repeat to determine if it is still trending upwards or not. OK to be physically intimate with her husband-no danger for him or her.

## 2014-05-17 DIAGNOSIS — I10 Essential (primary) hypertension: Secondary | ICD-10-CM | POA: Diagnosis not present

## 2014-05-17 DIAGNOSIS — Z79899 Other long term (current) drug therapy: Secondary | ICD-10-CM | POA: Diagnosis not present

## 2014-05-17 DIAGNOSIS — N189 Chronic kidney disease, unspecified: Secondary | ICD-10-CM | POA: Diagnosis not present

## 2014-05-17 DIAGNOSIS — D649 Anemia, unspecified: Secondary | ICD-10-CM | POA: Diagnosis not present

## 2014-05-17 DIAGNOSIS — R809 Proteinuria, unspecified: Secondary | ICD-10-CM | POA: Diagnosis not present

## 2014-05-17 DIAGNOSIS — E559 Vitamin D deficiency, unspecified: Secondary | ICD-10-CM | POA: Diagnosis not present

## 2014-05-19 ENCOUNTER — Other Ambulatory Visit: Payer: Medicare Other

## 2014-06-09 DIAGNOSIS — N182 Chronic kidney disease, stage 2 (mild): Secondary | ICD-10-CM | POA: Diagnosis not present

## 2014-06-09 DIAGNOSIS — E559 Vitamin D deficiency, unspecified: Secondary | ICD-10-CM | POA: Diagnosis not present

## 2014-06-09 DIAGNOSIS — I10 Essential (primary) hypertension: Secondary | ICD-10-CM | POA: Diagnosis not present

## 2014-07-13 ENCOUNTER — Ambulatory Visit (HOSPITAL_BASED_OUTPATIENT_CLINIC_OR_DEPARTMENT_OTHER): Payer: Medicare Other | Admitting: Oncology

## 2014-07-13 ENCOUNTER — Telehealth: Payer: Self-pay | Admitting: Oncology

## 2014-07-13 ENCOUNTER — Other Ambulatory Visit (HOSPITAL_BASED_OUTPATIENT_CLINIC_OR_DEPARTMENT_OTHER): Payer: Medicare Other

## 2014-07-13 VITALS — BP 137/71 | HR 84 | Temp 98.6°F | Resp 18 | Ht 63.0 in | Wt 161.4 lb

## 2014-07-13 DIAGNOSIS — D3A098 Benign carcinoid tumors of other sites: Secondary | ICD-10-CM

## 2014-07-13 DIAGNOSIS — C7A00 Malignant carcinoid tumor of unspecified site: Secondary | ICD-10-CM | POA: Diagnosis not present

## 2014-07-13 DIAGNOSIS — C189 Malignant neoplasm of colon, unspecified: Secondary | ICD-10-CM | POA: Diagnosis not present

## 2014-07-13 DIAGNOSIS — C7A010 Malignant carcinoid tumor of the duodenum: Secondary | ICD-10-CM | POA: Diagnosis not present

## 2014-07-13 DIAGNOSIS — I10 Essential (primary) hypertension: Secondary | ICD-10-CM | POA: Diagnosis not present

## 2014-07-13 DIAGNOSIS — E119 Type 2 diabetes mellitus without complications: Secondary | ICD-10-CM | POA: Diagnosis not present

## 2014-07-13 DIAGNOSIS — D490 Neoplasm of unspecified behavior of digestive system: Secondary | ICD-10-CM | POA: Diagnosis not present

## 2014-07-13 DIAGNOSIS — R197 Diarrhea, unspecified: Secondary | ICD-10-CM

## 2014-07-13 NOTE — Progress Notes (Signed)
  [place] OFFICE PROGRESS NOTE   Diagnosis: Carcinoid tumor  INTERVAL HISTORY:   Tiffany Ortiz returns as scheduled. She reports increased flushing, but this is not as severe as it was when she was first diagnosed with a carcinoid tumor. She has intermittent nausea. She complains of "knots "near the ischium  bilaterally that come and go. Her appetite is variable.  Objective:  Vital signs in last 24 hours:  Blood pressure 137/71, pulse 84, temperature 98.6 F (37 C), temperature source Oral, resp. rate 18, height 5\' 3"  (1.6 m), weight 161 lb 6.4 oz (73.211 kg), SpO2 100 %.    HEENT: Neck without mass Lymphatics: No cervical, supraclavicular, or axillary nodes Resp: Lungs clear bilaterally Cardio: Regular rate and rhythm GI: No hepatosplenomegaly, nontender, no mass Vascular: No leg edema Musculoskeletal: Examination of the lower buttock and ischial area bilaterally is unremarkable     Lab Results: Chromogranin A on 03/10/2014-123   Medications: I have reviewed the patient's current medications.  Assessment/Plan: 1. 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, clinical stage III (uT2, pN1), status post an endoscopic biopsy 02/04/2013 confirming a duodenal carcinoid tumor, status post a duodenal resection 03/31/2013 with no remaining tumor found in the duodenum and a positive portal lymph node   Repeat upper endoscopy/EUS on 08/20/2013 confirming a persistent duodenal nodule with a biopsy confirming a low-grade neuroendocrine carcinoma 2. Diarrhea-intermittent, diet related-I doubt this is related to carcinoid disease  3. Diabetes  4. Sleep apnea  5. Fibromyalgia  6. Hypertension  7. Gastroesophageal reflux disease  8. Status post dilatation of a Schatzki's ring 02/04/2013  9. Vitiligo and hyperpigmented skin rash-evaluated by dermatology   Disposition:  Her overall status appears unchanged. I have a low  suspicion for clinical progression of the carcinoid tumor. She last underwent an upper endoscopy by Dr. Ardis Hughs in April 2015. I will refer her to Dr. Ardis Hughs for a repeat endoscopy. We will follow-up on the chromogranin A level from today. [name] will return for an office and lab visit in 4 months.  Betsy Coder, MD  07/13/2014  2:07 PM

## 2014-07-13 NOTE — Telephone Encounter (Addendum)
Left message to confirm appointments for July. See referral notes.Mailed calendar.

## 2014-07-15 ENCOUNTER — Telehealth: Payer: Self-pay | Admitting: *Deleted

## 2014-07-15 NOTE — Telephone Encounter (Signed)
Chromogranin is not back yet

## 2014-07-15 NOTE — Telephone Encounter (Signed)
Patient called wanting an update on recent lab. Nurse routed information to MD Benay Spice and Nurse Rosalio Macadamia.

## 2014-07-16 LAB — CHROMOGRANIN A: Chromogranin A: 176 ng/mL — ABNORMAL HIGH (ref <=15)

## 2014-07-20 ENCOUNTER — Telehealth: Payer: Self-pay | Admitting: *Deleted

## 2014-07-20 ENCOUNTER — Encounter: Payer: Self-pay | Admitting: *Deleted

## 2014-07-20 NOTE — Telephone Encounter (Signed)
-----   Message from Ladell Pier, MD sent at 07/16/2014  6:01 PM EST ----- Please call patient, chromagranin is slightly higher, f/u as scheduled with  Dr. Ardis Hughs, Repeat chromagranin in 3 months, stop anitacid therapy for 5 days prior to lab

## 2014-07-20 NOTE — Telephone Encounter (Signed)
Attempted to call pt X2 re: lab results.  No answer/no answering machine for office to leave message.

## 2014-07-21 ENCOUNTER — Ambulatory Visit: Payer: No Typology Code available for payment source | Admitting: Physician Assistant

## 2014-07-23 ENCOUNTER — Telehealth: Payer: Self-pay | Admitting: Oncology

## 2014-07-23 NOTE — Telephone Encounter (Signed)
Called pt with Chromagranin results. Follow up with Dr. Ardis Hughs office next week as scheduled. Will recheck lab in 3 mos. Pt instructed to discontinue antacid at least 5 days prior to test. Pt voiced understanding. Asks what she can take PRN on the days she holds her antacid. Will review with Dr. Benay Spice.

## 2014-07-23 NOTE — Telephone Encounter (Signed)
per pof to sch pt appt-gave pt time & date

## 2014-07-27 ENCOUNTER — Encounter: Payer: Self-pay | Admitting: Physician Assistant

## 2014-07-27 ENCOUNTER — Ambulatory Visit (INDEPENDENT_AMBULATORY_CARE_PROVIDER_SITE_OTHER): Payer: Medicare Other | Admitting: Physician Assistant

## 2014-07-27 VITALS — BP 138/74 | HR 72 | Ht 63.0 in | Wt 165.0 lb

## 2014-07-27 DIAGNOSIS — D3A019 Benign carcinoid tumor of the small intestine, unspecified portion: Secondary | ICD-10-CM

## 2014-07-27 DIAGNOSIS — C189 Malignant neoplasm of colon, unspecified: Secondary | ICD-10-CM | POA: Diagnosis not present

## 2014-07-27 DIAGNOSIS — C179 Malignant neoplasm of small intestine, unspecified: Secondary | ICD-10-CM | POA: Diagnosis not present

## 2014-07-27 NOTE — Progress Notes (Signed)
i agree with the above note, plan 

## 2014-07-27 NOTE — Patient Instructions (Signed)
Take your Zofran first thing in the morning every day then take another tablet 6 hours later each day.  You have been scheduled for an endoscopy. Please follow written instructions given to you at your visit today. If you use inhalers (even only as needed), please bring them with you on the day of your procedure.

## 2014-07-27 NOTE — Progress Notes (Signed)
Patient ID: Tiffany Ortiz, female   DOB: 1945-09-25, 69 y.o.   MRN: [id number]   Subjective:    Patient ID: Tiffany Ortiz, female    DOB: November 16, 1945, 69 y.o.   MRN: [id number]  HPI Tiffany Ortiz is a 69 year old African-American female known to Dr. Ardis Hughs. She was diagnosed with a duodenal carcinoid in 2014 and underwent a duodenal resection. She had repeat EGD and EUS in April 2015 per Dr. Ardis Hughs with finding of a persistent duodenal nodule. This was rebiopsied and consistent with a carcinoid tumor. This was just proximal to the surgical site. She has been followed expectantly as the biopsies showed a low-grade neuroendocrine carcinoma. She is being referred back by Dr. Benay Spice at this time for follow-up EGD. She has been relatively stable and there is low suspicion for progression however she has noted increased episodes of flushing over the past few months particularly at night. She says her appetite is fair's having a hard time eating and is nauseated most of the time. She has not had any significant weight loss. He occasionally has episodes of diarrhea with urgency but this is not on a regular basis. She denies any abdominal pain does endorse bloating. She has history of fibromyalgia degenerative disc disease chronic GERD and has multiple musculoskeletal complaints today. Most recent labs show an elevated chromogen A level which is been on the rise and is now 176 last 5 HIAA level was done July 2015 and was normal at 3.3. No recent abdominal imaging  Review of Systems Pertinent positive and negative review of systems were noted in the above HPI section.  All other review of systems was otherwise negative.  Outpatient Encounter Prescriptions as of 07/27/2014  Medication Sig  . ALPRAZolam (XANAX) 1 MG tablet Take 0.5-1 mg by mouth at bedtime as needed for anxiety.   Marland Kitchen amphetamine-dextroamphetamine (ADDERALL) 20 MG tablet   . antiseptic oral rinse (BIOTENE) LIQD 15 mLs by Mouth Rinse route as  needed for dry mouth.  . B Complex-C (SUPER B COMPLEX PO) Take 1 tablet by mouth daily.  Marland Kitchen bismuth subsalicylate (BISMATROL) 262 MG/15ML suspension Take by mouth.  . cetirizine (ZYRTEC) 10 MG tablet Take 10 mg by mouth daily.  . chlorthalidone (HYGROTON) 25 MG tablet Take 25 mg by mouth daily with breakfast.   . Cholecalciferol (VITAMIN D PO) Take 5,000 Units by mouth once a week. Mondays  . DEXILANT 60 MG capsule TAKE (1) CAPSULE BY MOUTH ONCE DAILY.  . diphenhydrAMINE (BENADRYL) 25 MG tablet Take 25 mg by mouth at bedtime as needed.   . fenofibrate 160 MG tablet Take 160 mg by mouth daily.  Marland Kitchen FLUoxetine (PROZAC) 10 MG capsule Take 10 mg by mouth daily.  Marland Kitchen gabapentin (NEURONTIN) 300 MG capsule Take 300 mg by mouth 2 (two) times daily.   Marland Kitchen glipiZIDE (GLUCOTROL XL) 5 MG 24 hr tablet Take 5 mg by mouth daily.  Marland Kitchen linagliptin (TRADJENTA) 5 MG TABS tablet Take 5 mg by mouth daily.  Marland Kitchen losartan (COZAAR) 100 MG tablet Take 100 mg by mouth every morning.   . Nutritional Supplements (DHEA PO) Take 1 tablet by mouth at bedtime.  . Omega-3 Fatty Acids (FISH OIL PO) Take by mouth.  . ondansetron (ZOFRAN) 4 MG tablet Take 4 mg by mouth every 8 (eight) hours as needed for nausea or vomiting.  Marland Kitchen oxymetazoline (AFRIN) 0.05 % nasal spray Place 1 spray into both nostrils 2 (two) times daily as needed for congestion.  . potassium chloride SA (  K-DUR,KLOR-CON) 20 MEQ tablet Take 20 mEq by mouth daily.  . pravastatin (PRAVACHOL) 20 MG tablet Take 10 mg by mouth daily.   . Probiotic Product (RESTORA PO) Take 1 tablet by mouth daily.  Marland Kitchen Propylene Glycol-Glycerin (SOOTHE OP) Place 1 drop into both eyes 2 (two) times daily.  . valACYclovir (VALTREX) 500 MG tablet Take 500 mg by mouth as needed.  . verapamil (CALAN-SR) 240 MG CR tablet Take 240 mg by mouth every morning.    Allergies  Allergen Reactions  . Other     Metal alloy-breaks her out-esp. Metal surgical instruments or some metal implanted in me  . Asa  [Aspirin]     Kidney doctor told her to not take ASA  . Erythromycin     Doesn't remember   . Famciclovir     migrane  . Hydrocodone     "makes me lose my mind"-hallucinations  . Ibuprofen     Kidney doctor told her to not take ibuprofen.  . Lactase Diarrhea  . Lactose Intolerance (Gi) Diarrhea and Other (See Comments)    Severe diarrhea  . Oxycodone     hallucinations  . Promethazine Nausea And Vomiting  . Tylenol [Acetaminophen]     Knocks her out.   Patient Active Problem List   Diagnosis Date Noted  . Carcinoid tumor of intestine 03/31/2013  . Nonspecific (abnormal) findings on radiological and other examination of gastrointestinal tract 02/12/2013  . Chronic diarrhea 01/28/2013  . GERD (gastroesophageal reflux disease) 01/28/2013  . Esophageal dysphagia 01/28/2013   History   Social History  . Marital Status: Married    Spouse Name: N/A  . Number of Children: N/A  . Years of Education: N/A   Occupational History  . Not on file.   Social History Main Topics  . Smoking status: Former Smoker    Types: Cigarettes  . Smokeless tobacco: Former Systems developer    Quit date: 02/11/1970  . Alcohol Use: No  . Drug Use: No  . Sexual Activity: Not on file   Other Topics Concern  . 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 Gerwig's family history includes Cancer in her father and mother. There is no history of Colon cancer.      Objective:    Filed Vitals:   07/27/14 1054  BP: 138/74  Pulse: 72    Physical Exam  well-developed older African-American female in no acute distress to accompanied by her husband and daughter, talkative blood pressure 138/74 pulse 72 height 5 foot 3 weight 165. HEENT ;nontraumatic normocephalic EOMI PERRLA sclera anicteric, Neck ;supple no JVD, Cardiovascular ;regular rate and rhythm with S1-S2 no murmur or gallop,  Pulmonary; clear bilaterally, Abdomen; obese soft nontender nondistended midline incisional scar no palpable mass or hepatosplenomegaly, Rectal; exam not done, Ext; no clubbing cyanosis or edema skin warm and dry examination of the back and perianal area reveals no evidence of decubitus or other lesion, Extremities ;no clubbing cyanosis or edema skin warm and dry, Psych; mood and affect appropriate       Assessment & Plan:   #1 69 yo female with hx of duodenal carcinoid -s/p resection 2014. Follow up EUS/EGD 08/2013 revealed a persistent nodule proximal to anastomosis and bx positive for low grade neuroendocrine carcinoma #2 Flushing #3 nausea #4 GERD #5 OSA  Plan; Pt encouraged  to use Zofran scheduled to control nausea. Schedule for EGD with Dr. Lyndee Leo discussed in detail with pt and she is agreeable to proceed  Alfredia Ferguson PA-C 07/27/2014   Cc: Delphina Cahill, MD

## 2014-07-28 ENCOUNTER — Encounter: Payer: Medicare Other | Attending: Internal Medicine | Admitting: Nutrition

## 2014-07-28 ENCOUNTER — Encounter: Payer: Self-pay | Admitting: Nutrition

## 2014-07-28 VITALS — Ht 63.0 in | Wt 166.6 lb

## 2014-07-28 DIAGNOSIS — E118 Type 2 diabetes mellitus with unspecified complications: Secondary | ICD-10-CM | POA: Diagnosis not present

## 2014-07-28 DIAGNOSIS — C7A00 Malignant carcinoid tumor of unspecified site: Secondary | ICD-10-CM | POA: Diagnosis not present

## 2014-07-28 DIAGNOSIS — Z713 Dietary counseling and surveillance: Secondary | ICD-10-CM | POA: Insufficient documentation

## 2014-07-28 DIAGNOSIS — R197 Diarrhea, unspecified: Secondary | ICD-10-CM | POA: Diagnosis not present

## 2014-07-28 DIAGNOSIS — G4730 Sleep apnea, unspecified: Secondary | ICD-10-CM | POA: Diagnosis not present

## 2014-07-28 DIAGNOSIS — IMO0002 Reserved for concepts with insufficient information to code with codable children: Secondary | ICD-10-CM

## 2014-07-28 DIAGNOSIS — E1165 Type 2 diabetes mellitus with hyperglycemia: Secondary | ICD-10-CM

## 2014-07-28 DIAGNOSIS — Z6829 Body mass index (BMI) 29.0-29.9, adult: Secondary | ICD-10-CM | POA: Diagnosis not present

## 2014-07-28 NOTE — Progress Notes (Signed)
  Medical Nutrition Therapy:  Appt start time: 1130 end time:  1230.  Assessment:  Primary concerns today: DIabetes. Her daughter is here today. Has lots of stomach issues.   Had suirgery on her colon for carcinoid tumor. Had her esphogus stretched a few times.Been diabetic for 4 years.  PMH: HtN, DM, Stg 3 CKD. Hse notes her kidney function has improved recently., and Hyperlipidemia.   She is Taking 5 mg of Glipizide and 5 mg of Trajenta for her diabetes. A1C was 6.5%  Complains of a lot of GI issues with digestion, nausea and foods causing diarrhea.    Current diet is insuffient to meet her nutritional needs. Eats snacks and not a lot of lower carb vegetables and fresh fruit.  She has limited balance and notes she has fell a few times.  Preferred Learning Style:   Auditory  Visual  Hands on  Learning Readiness:    Not ready  Contemplating  Ready  Change in progress  MEDICATIONS: See list   DIETARY INTAKE:   24-hr recall:  B ( AM): 4 PB CRACKERS and apple, OR bacong, eggs or oatmeal, water Snk ( AM): nuts L ( PM): Chopped steak, greens, macaroni and cheese, water Snk ( PM): 4 oz cup ice cream D ( PM): [place] stew,4-5 crackers, Snk ( PM): cheetos, water Beverages: water  Usual physical activity: ADL   Estimated energy needs: 1200 calories 135 g carbohydrates 90 g protein 33 g fat  Progress Towards Goal(s):  In progress.   Nutritional Diagnosis:  NB-1.1 Food and nutrition-related knowledge deficit As related to DIabetes.  As evidenced by A1C 6.5% and being overweight with BMI of 28.    Intervention:  Nutrition and diabetes education on diet, disease, meal planning, My Plate, CHO Counting, portion sizes, prevention of complications and importance of exercise. Avoiding snacks and following a low salt low fat high fiber diet and using Mrs. Dash and avoiding processed meats and junk food due to history of cancer in small intestine.  Goal:  1. Cut out junk  food.  2. Follow Meal Plan 3. Increase fresh fruits and vegetables. 4. Exercise 15-30 minutes daily. 6. Cut out processed meats. 7. Keep A1C 6.5% or lower. 8. Avoid foods that irritate GI: salad, greens and acidic or spicy foods.  Teaching Method Utilized:  Visual Auditory Hands on  Handouts given during visit include: The Plate Method Meal Plan Card  Barriers to learning/adherence to lifestyle change: none  Demonstrated degree of understanding via:  Teach Back   Monitoring/Evaluation:  Dietary intake, exercise, meal planning, and body weight in 1 month(s).

## 2014-07-28 NOTE — Patient Instructions (Addendum)
Goal:  1. Cut out junk food.  2. Follow Meal Plan 3. Increase fresh fruits and vegetables. 4. Exercise 15-30 minutes daily. 6. Cut out processed meats. 7. Keep A1C 6.5% or lower. 8. Aovid foods that irritate your bowels--salad, greens, cabbage etc.

## 2014-08-10 DIAGNOSIS — E782 Mixed hyperlipidemia: Secondary | ICD-10-CM | POA: Diagnosis not present

## 2014-08-10 DIAGNOSIS — Z23 Encounter for immunization: Secondary | ICD-10-CM | POA: Diagnosis not present

## 2014-08-10 DIAGNOSIS — E1165 Type 2 diabetes mellitus with hyperglycemia: Secondary | ICD-10-CM | POA: Diagnosis not present

## 2014-08-11 DIAGNOSIS — E782 Mixed hyperlipidemia: Secondary | ICD-10-CM | POA: Diagnosis not present

## 2014-08-11 DIAGNOSIS — E1165 Type 2 diabetes mellitus with hyperglycemia: Secondary | ICD-10-CM | POA: Diagnosis not present

## 2014-08-13 ENCOUNTER — Other Ambulatory Visit (HOSPITAL_COMMUNITY): Payer: Self-pay | Admitting: Radiology

## 2014-08-13 DIAGNOSIS — G4730 Sleep apnea, unspecified: Secondary | ICD-10-CM

## 2014-08-16 ENCOUNTER — Other Ambulatory Visit (HOSPITAL_COMMUNITY): Payer: Self-pay | Admitting: Internal Medicine

## 2014-08-16 DIAGNOSIS — Z1231 Encounter for screening mammogram for malignant neoplasm of breast: Secondary | ICD-10-CM

## 2014-08-17 DIAGNOSIS — Z6828 Body mass index (BMI) 28.0-28.9, adult: Secondary | ICD-10-CM | POA: Diagnosis not present

## 2014-08-17 DIAGNOSIS — M2011 Hallux valgus (acquired), right foot: Secondary | ICD-10-CM | POA: Diagnosis not present

## 2014-08-17 DIAGNOSIS — M79676 Pain in unspecified toe(s): Secondary | ICD-10-CM | POA: Diagnosis not present

## 2014-08-17 DIAGNOSIS — E1142 Type 2 diabetes mellitus with diabetic polyneuropathy: Secondary | ICD-10-CM | POA: Diagnosis not present

## 2014-08-30 ENCOUNTER — Encounter: Payer: Self-pay | Admitting: Podiatry

## 2014-08-30 ENCOUNTER — Ambulatory Visit (INDEPENDENT_AMBULATORY_CARE_PROVIDER_SITE_OTHER): Payer: Medicare Other | Admitting: Podiatry

## 2014-08-30 VITALS — BP 124/77 | HR 73 | Resp 12

## 2014-08-30 DIAGNOSIS — E0841 Diabetes mellitus due to underlying condition with diabetic mononeuropathy: Secondary | ICD-10-CM | POA: Diagnosis not present

## 2014-08-30 DIAGNOSIS — M2010 Hallux valgus (acquired), unspecified foot: Secondary | ICD-10-CM

## 2014-08-30 NOTE — Patient Instructions (Signed)
Adjust gabapentin based on the amount of discomfort in your feet taking 1-3 per day as needed Continue wearing her diabetic shoes daily  Diabetes and Foot Care Diabetes may cause you to have problems because of poor blood supply (circulation) to your feet and legs. This may cause the skin on your feet to become thinner, break easier, and heal more slowly. Your skin may become dry, and the skin may peel and crack. You may also have nerve damage in your legs and feet causing decreased feeling in them. You may not notice minor injuries to your feet that could lead to infections or more serious problems. Taking care of your feet is one of the most important things you can do for yourself.  HOME CARE INSTRUCTIONS  Wear shoes at all times, even in the house. Do not go barefoot. Bare feet are easily injured.  Check your feet daily for blisters, cuts, and redness. If you cannot see the bottom of your feet, use a mirror or ask someone for help.  Wash your feet with warm water (do not use hot water) and mild soap. Then pat your feet and the areas between your toes until they are completely dry. Do not soak your feet as this can dry your skin.  Apply a moisturizing lotion or petroleum jelly (that does not contain alcohol and is unscented) to the skin on your feet and to dry, brittle toenails. Do not apply lotion between your toes.  Trim your toenails straight across. Do not dig under them or around the cuticle. File the edges of your nails with an emery board or nail file.  Do not cut corns or calluses or try to remove them with medicine.  Wear clean socks or stockings every day. Make sure they are not too tight. Do not wear knee-high stockings since they may decrease blood flow to your legs.  Wear shoes that fit properly and have enough cushioning. To break in new shoes, wear them for just a few hours a day. This prevents you from injuring your feet. Always look in your shoes before you put them on to be  sure there are no objects inside.  Do not cross your legs. This may decrease the blood flow to your feet.  If you find a minor scrape, cut, or break in the skin on your feet, keep it and the skin around it clean and dry. These areas may be cleansed with mild soap and water. Do not cleanse the area with peroxide, alcohol, or iodine.  When you remove an adhesive bandage, be sure not to damage the skin around it.  If you have a wound, look at it several times a day to make sure it is healing.  Do not use heating pads or hot water bottles. They may burn your skin. If you have lost feeling in your feet or legs, you may not know it is happening until it is too late.  Make sure your health care provider performs a complete foot exam at least annually or more often if you have foot problems. Report any cuts, sores, or bruises to your health care provider immediately. SEEK MEDICAL CARE IF:   You have an injury that is not healing.  You have cuts or breaks in the skin.  You have an ingrown nail.  You notice redness on your legs or feet.  You feel burning or tingling in your legs or feet.  You have pain or cramps in your legs and feet.  Your legs or feet are numb.  Your feet always feel cold. SEEK IMMEDIATE MEDICAL CARE IF:   There is increasing redness, swelling, or pain in or around a wound.  There is a red line that goes up your leg.  Pus is coming from a wound.  You develop a fever or as directed by your health care provider.  You notice a bad smell coming from an ulcer or wound. Document Released: 04/20/2000 Document Revised: 12/24/2012 Document Reviewed: 09/30/2012 [facility] Patient Information 2015 [place], [place]. This information is not intended to replace advice given to you by your health care provider. Make sure you discuss any questions you have with your health care provider.

## 2014-08-30 NOTE — Progress Notes (Signed)
   Subjective:    Patient ID: Tiffany Ortiz, female    DOB: [birth year], 69 y.o.   MRN: [id number]  HPI N-REDNESS, TINGLING SENSATION L-B/L BUNION, RT TOP OF THE FOOT D-LONG-TERM O-SLOWLY C-WORSE A-WEARING CERTAIN SHOES T-NONE  Patient denies any history of foot ulceration or claudication Has history of podiatry visit the last several months Currently wearing diabetic maryjane shoes with custom insoles    Review of Systems  Constitutional: Positive for activity change, appetite change and fatigue.  Respiratory: Positive for chest tightness.   Gastrointestinal: Positive for nausea and diarrhea.  Musculoskeletal: Positive for back pain, joint swelling and gait problem.  Neurological: Positive for weakness.  Psychiatric/Behavioral: Positive for confusion. The patient is nervous/anxious.        Objective:   Physical Exam  Patient appears orientated 3 with her son present in the treatment room today  Vascular: DP and PT pulses 2/4 bilaterally  Neurological: Sensation to 10 g monofilament wire intact 5/5 bilaterally Vibratory sensation intact bilaterally Ankle reflex equal and reactive bilaterally  Dermatological: Texture and turgor within normal limits bilaterally  Musculoskeletal: HAV deformities bilaterally without any inflammatory changes noted bilaterally Hammertoe second bilaterally without any skin lesions noted bilaterally    Assessment & Plan:   Assessment:  Satisfactory neurovascular status HAV deformities bilaterally Hammertoe deformity second bilaterally Tingling sensation in the feet is  suggested of mild diabetic peripheral neuropathy  Plan:  Review the results of examination with patient and son At this time I think the best treatment would be accommodative shoes which patient currently is wearing that fit well with custom insoles. Patient also had appear of soft shoes athletic in nature that contoured satisfactory, and she could wear these  shoes as long as the shoes fit well did not compressed a bunion hammertoes.  Reappoint yearly or at patient's request

## 2014-08-31 ENCOUNTER — Encounter: Payer: Medicare Other | Admitting: Gastroenterology

## 2014-08-31 ENCOUNTER — Encounter: Payer: Self-pay | Admitting: Podiatry

## 2014-09-13 ENCOUNTER — Telehealth: Payer: Self-pay | Admitting: Gastroenterology

## 2014-09-13 ENCOUNTER — Encounter: Payer: Medicare Other | Admitting: Nutrition

## 2014-09-13 NOTE — Telephone Encounter (Signed)
Pt states she pain in her "butt cheek"  I advised her to call her PCP for evaluation and call back if she has further concerns, she will also keep upcoming EGD.

## 2014-09-15 ENCOUNTER — Ambulatory Visit (HOSPITAL_COMMUNITY): Payer: Medicare Other

## 2014-09-24 ENCOUNTER — Ambulatory Visit (HOSPITAL_COMMUNITY)
Admission: RE | Admit: 2014-09-24 | Discharge: 2014-09-24 | Disposition: A | Discharge status: Home / Self Care | Payer: Medicare Other | Source: Ambulatory Visit | Attending: Internal Medicine | Admitting: Internal Medicine

## 2014-09-24 DIAGNOSIS — Z1231 Encounter for screening mammogram for malignant neoplasm of breast: Secondary | ICD-10-CM | POA: Diagnosis not present

## 2014-09-27 ENCOUNTER — Ambulatory Visit (AMBULATORY_SURGERY_CENTER): Payer: Medicare Other | Admitting: Gastroenterology

## 2014-09-27 ENCOUNTER — Encounter: Payer: Self-pay | Admitting: Gastroenterology

## 2014-09-27 VITALS — BP 145/83 | HR 78 | Temp 97.0°F | Resp 23 | Ht 63.0 in | Wt 165.0 lb

## 2014-09-27 DIAGNOSIS — R110 Nausea: Secondary | ICD-10-CM | POA: Diagnosis not present

## 2014-09-27 DIAGNOSIS — G4733 Obstructive sleep apnea (adult) (pediatric): Secondary | ICD-10-CM | POA: Diagnosis not present

## 2014-09-27 DIAGNOSIS — F909 Attention-deficit hyperactivity disorder, unspecified type: Secondary | ICD-10-CM | POA: Diagnosis not present

## 2014-09-27 DIAGNOSIS — E669 Obesity, unspecified: Secondary | ICD-10-CM | POA: Diagnosis not present

## 2014-09-27 DIAGNOSIS — K2980 Duodenitis without bleeding: Secondary | ICD-10-CM | POA: Diagnosis not present

## 2014-09-27 DIAGNOSIS — M797 Fibromyalgia: Secondary | ICD-10-CM | POA: Diagnosis not present

## 2014-09-27 DIAGNOSIS — C7A010 Malignant carcinoid tumor of the duodenum: Secondary | ICD-10-CM | POA: Diagnosis not present

## 2014-09-27 DIAGNOSIS — I10 Essential (primary) hypertension: Secondary | ICD-10-CM | POA: Diagnosis not present

## 2014-09-27 DIAGNOSIS — F341 Dysthymic disorder: Secondary | ICD-10-CM | POA: Diagnosis not present

## 2014-09-27 DIAGNOSIS — K449 Diaphragmatic hernia without obstruction or gangrene: Secondary | ICD-10-CM

## 2014-09-27 DIAGNOSIS — C189 Malignant neoplasm of colon, unspecified: Secondary | ICD-10-CM

## 2014-09-27 MED ORDER — SODIUM CHLORIDE 0.9 % IV SOLN: 500.0000 mL | INTRAVENOUS | Status: DC

## 2014-09-27 NOTE — Progress Notes (Signed)
Called to room to assist during endoscopic procedure.  Patient ID and intended procedure confirmed with present staff. Received instructions for my participation in the procedure from the performing physician.  

## 2014-09-27 NOTE — Progress Notes (Signed)
Report to PACU, RN, vss, BBS= Clear.  

## 2014-09-27 NOTE — Patient Instructions (Signed)
Discharge instructions given. Biopsies taken. Resume previous medications. YOU HAD AN ENDOSCOPIC PROCEDURE TODAY AT THE [facility]:   Refer to the procedure report that was given to you for any specific questions about what was found during the examination.  If the procedure report does not answer your questions, please call your gastroenterologist to clarify.  If you requested that your care partner not be given the details of your procedure findings, then the procedure report has been included in a sealed envelope for you to review at your convenience later.  YOU SHOULD EXPECT: Some feelings of bloating in the abdomen. Passage of more gas than usual.  Walking can help get rid of the air that was put into your GI tract during the procedure and reduce the bloating. If you had a lower endoscopy (such as a colonoscopy or flexible sigmoidoscopy) you may notice spotting of blood in your stool or on the toilet paper. If you underwent a bowel prep for your procedure, you may not have a normal bowel movement for a few days.  Please Note:  You might notice some irritation and congestion in your nose or some drainage.  This is from the oxygen used during your procedure.  There is no need for concern and it should clear up in a day or so.  SYMPTOMS TO REPORT IMMEDIATELY:   Following upper endoscopy (EGD)  Vomiting of blood or coffee ground material  New chest pain or pain under the shoulder blades  Painful or persistently difficult swallowing  New shortness of breath  Fever of 100F or higher  Black, tarry-looking stools  For urgent or emergent issues, a gastroenterologist can be reached at any hour by calling [phone].   DIET: Your first meal following the procedure should be a small meal and then it is ok to progress to your normal diet. Heavy or fried foods are harder to digest and may make you feel nauseous or bloated.  Likewise, meals heavy in dairy and vegetables can increase  bloating.  Drink plenty of fluids but you should avoid alcoholic beverages for 24 hours.  ACTIVITY:  You should plan to take it easy for the rest of today and you should NOT DRIVE or use heavy machinery until tomorrow (because of the sedation medicines used during the test).    FOLLOW UP: Our staff will call the number listed on your records the next business day following your procedure to check on you and address any questions or concerns that you may have regarding the information given to you following your procedure. If we do not reach you, we will leave a message.  However, if you are feeling well and you are not experiencing any problems, there is no need to return our call.  We will assume that you have returned to your regular daily activities without incident.  If any biopsies were taken you will be contacted by phone or by letter within the next 1-3 weeks.  Please call us at [phone] if you have not heard about the biopsies in 3 weeks.    SIGNATURES/CONFIDENTIALITY: You and/or your care partner have signed paperwork which will be entered into your electronic medical record.  These signatures attest to the fact that that the information above on your After Visit Summary has been reviewed and is understood.  Full responsibility of the confidentiality of this discharge information lies with you and/or your care-partner. 

## 2014-09-27 NOTE — Op Note (Signed)
[place]  [address] [place], [id number]   ENDOSCOPY PROCEDURE REPORT  PATIENT: Tiffany, Ortiz  MR#: [id number] BIRTHDATE: 1945-07-17 , 68  yrs. old GENDER: female ENDOSCOPIST: Milus Banister, MD PROCEDURE DATE:  09/27/2014 PROCEDURE:  EGD w/ biopsy ASA CLASS:     Class III INDICATIONS:  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 (originally noted by Dr. Gala Romney), clinical stage III (uT2, pN1), status post an endoscopic biopsy 02/04/2013 confirming a duodenal carcinoid tumor, status post a duodenal resection 03/31/2013 with no remaining tumor found in the duodenum and a positive portal lymph node  Repeat upper endoscopy/EUS on 08/20/2013 confirming a persistent duodenal nodule with a biopsy confirming a low-grade neuroendocrine carcinoma. MEDICATIONS: Monitored anesthesia care and Propofol 200 mg IV TOPICAL ANESTHETIC: none  DESCRIPTION OF PROCEDURE: After the risks benefits and alternatives of the procedure were thoroughly explained, informed consent was obtained.  The LB [id number] endoscope was introduced through the mouth and advanced to the second portion of the duodenum , Without limitations.  The instrument was slowly withdrawn as the mucosa was fully examined.  The surgical site/scar was clearly visible in very distal duodenal bulb.  There was some reactive appearing mucosa at the site and it was biopsied (path jar 1).  About 1-2cm proximal to the surgical site there was a 1cm nodule that looks similar to the previously noted duodenal bulb carcinoid.  This site was extensively biospied (path jar 2).  There was a 2cm hiatal hernia.  The examination was otherwise normal.  Retroflexed views revealed no abnormalities. The scope was then withdrawn from the patient and the procedure completed.  COMPLICATIONS: There were no immediate complications.  ENDOSCOPIC IMPRESSION: The surgical site/scar  was clearly visible in very distal duodenal bulb.  There was some reactive appearing mucosa at the site and it was biopsied (path jar 1).  About 1-2cm proximal to the surgical site there was a 1cm nodule that looks similar to the previously noted duodenal bulb carcinoid.  This site was extensively biospied (path jar 2).  There was a 2cm hiatal hernia.  The examination was otherwise normal  RECOMMENDATIONS: Await final pathology.  eSigned:  Milus Banister, MD 09/27/2014 10:25 AM    CC: Julieanne Manson, MD

## 2014-09-28 ENCOUNTER — Telehealth: Payer: Self-pay | Admitting: *Deleted

## 2014-09-28 NOTE — Telephone Encounter (Signed)
  Follow up Call-  Call back number 09/27/2014  Post procedure Call Back phone  # [phone]  Permission to leave phone message Yes     No answer and no voicemail at # given.

## 2014-09-30 DIAGNOSIS — K649 Unspecified hemorrhoids: Secondary | ICD-10-CM | POA: Diagnosis not present

## 2014-09-30 DIAGNOSIS — Z01411 Encounter for gynecological examination (general) (routine) with abnormal findings: Secondary | ICD-10-CM | POA: Diagnosis not present

## 2014-09-30 DIAGNOSIS — N762 Acute vulvitis: Secondary | ICD-10-CM | POA: Diagnosis not present

## 2014-09-30 DIAGNOSIS — K6289 Other specified diseases of anus and rectum: Secondary | ICD-10-CM | POA: Diagnosis not present

## 2014-09-30 DIAGNOSIS — B009 Herpesviral infection, unspecified: Secondary | ICD-10-CM | POA: Diagnosis not present

## 2014-10-12 DIAGNOSIS — M898X9 Other specified disorders of bone, unspecified site: Secondary | ICD-10-CM | POA: Diagnosis not present

## 2014-10-12 DIAGNOSIS — Z01411 Encounter for gynecological examination (general) (routine) with abnormal findings: Secondary | ICD-10-CM | POA: Diagnosis not present

## 2014-10-15 ENCOUNTER — Other Ambulatory Visit: Payer: Self-pay | Admitting: Oncology

## 2014-10-20 ENCOUNTER — Encounter: Payer: Medicare Other | Admitting: Nutrition

## 2014-10-22 ENCOUNTER — Telehealth: Payer: Self-pay | Admitting: *Deleted

## 2014-10-22 NOTE — Telephone Encounter (Signed)
VM message from pt received @ 12:17 pm . TC back to patient and spoke with her. Pt has h/o chronic diarrhea, most likely NOT related to her cancer dx. Pt states she is still having diarrhea and reviewed some of her dietary changes with some relief. But pt states she has her 50th wedding anniversary dinner to go too tonight and is seeking additional relief.  Reviewed her current medications with her and found that she has not not been taking lomotil correctly. She had been taking only 1per day. Reviewed instructions with her and she voiced understanding and will follow directions as written on package. No further issues identified.

## 2014-10-25 ENCOUNTER — Other Ambulatory Visit: Payer: Medicare Other

## 2014-11-09 ENCOUNTER — Other Ambulatory Visit: Payer: Medicare Other

## 2014-11-09 ENCOUNTER — Telehealth: Payer: Self-pay | Admitting: *Deleted

## 2014-11-09 ENCOUNTER — Ambulatory Visit (HOSPITAL_BASED_OUTPATIENT_CLINIC_OR_DEPARTMENT_OTHER): Payer: Medicare Other | Admitting: Oncology

## 2014-11-09 VITALS — BP 139/74 | HR 76 | Temp 97.6°F | Resp 20 | Ht 63.0 in | Wt 163.0 lb

## 2014-11-09 DIAGNOSIS — C7A00 Malignant carcinoid tumor of unspecified site: Secondary | ICD-10-CM

## 2014-11-09 DIAGNOSIS — D3A098 Benign carcinoid tumors of other sites: Secondary | ICD-10-CM

## 2014-11-09 DIAGNOSIS — C7A010 Malignant carcinoid tumor of the duodenum: Secondary | ICD-10-CM | POA: Diagnosis not present

## 2014-11-09 DIAGNOSIS — C189 Malignant neoplasm of colon, unspecified: Secondary | ICD-10-CM | POA: Diagnosis not present

## 2014-11-09 DIAGNOSIS — M797 Fibromyalgia: Secondary | ICD-10-CM | POA: Diagnosis not present

## 2014-11-09 NOTE — Telephone Encounter (Signed)
Opened in error

## 2014-11-09 NOTE — Progress Notes (Signed)
  [facility] OFFICE PROGRESS NOTE   Diagnosis: Carcinoid tumor  INTERVAL HISTORY:   Tiffany Ortiz returns as scheduled. She reports "hurting all over ". She relates this to fibromyalgia. She has reflux symptoms and intermittent nausea. No diarrhea or fever. She has intermittent flushing episodes. Dr. Ardis Hughs performed an upper endoscopy on 09/27/2014. A surgical scar was visible in the distal duodenal bulb. The area was biopsied. A 1 senna meter nodule was noted proximal to the surgical site that would similar to the previously noted duodenal bulb carcinoid tumor. This area was biopsied. The pathology revealed peptic duodenitis at the surgical scar. The duodenal bulb nodule returned as a well-differentiated neuroendocrine tumor. She took Dexilant on 11/06/2013. She is not sure whether she took it over the preceding few days. Objective:  Vital signs in last 24 hours:  Blood pressure 139/74, pulse 76, temperature 97.6 F (36.4 C), temperature source Oral, resp. rate 20, height 5\' 3"  (1.6 m), weight 163 lb (73.936 kg), SpO2 99 %.    HEENT: Neck without mass Lymphatics: No cervical or supra-clavicular nodes Resp: Lungs clear bilaterally Cardio: Regular rate and rhythm GI: No hepatosplenomegaly, nontender, no mass Vascular: No leg edema  Chromogranin A on 07/13/2014-176  Medications: I have reviewed the patient's current medications.  Assessment/Plan: 1. 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, clinical stage III (uT2, pN1), status post an endoscopic biopsy 02/04/2013 confirming a duodenal carcinoid tumor, status post a duodenal resection 03/31/2013 with no remaining tumor found in the duodenum and a positive portal lymph node   Repeat upper endoscopy/EUS on 08/20/2013 confirming a persistent duodenal nodule with a biopsy confirming a low-grade neuroendocrine carcinoma  Repeat upper endoscopy 09/27/2014 with a duodenal nodule  proximal to a surgical scar, biopsy confirmed a well-differentiated neuroendocrine tumor 2. Diarrhea-intermittent, diet related-I doubt this is related to carcinoid disease  3. Diabetes  4. Sleep apnea  5. Fibromyalgia  6. Hypertension  7. Gastroesophageal reflux disease  8. Status post dilatation of a Schatzki's ring 02/04/2013  9. Vitiligo and hyperpigmented skin rash-evaluated by dermatology    Disposition:  The chromogranin A level was higher in March. She has multiple complaints, but I do not think she is symptomatic from the carcinoid tumor. We will follow-up on the chromogranin a level from today realizing the level could be impacted by recent dexilant therapy. If the chromogranin A level is significantly higher she will be referred for a restaging CT to look for evidence of liver metastases.  Ms. Vogler will be scheduled for an office visit and chromogranin A level in 4 months. She plans to follow-up with Dr. Nevada Crane to discuss the possibility of consolidating her medical regimen.  Betsy Coder, MD  11/09/2014  10:22 AM

## 2014-11-10 ENCOUNTER — Telehealth: Payer: Self-pay | Admitting: Oncology

## 2014-11-10 NOTE — Telephone Encounter (Signed)
Confirmed appointment for November. Mailed calendar.

## 2014-11-12 LAB — CHROMOGRANIN A: Chromogranin A: 122 ng/mL — ABNORMAL HIGH (ref <=15)

## 2014-11-15 ENCOUNTER — Telehealth: Payer: Self-pay | Admitting: *Deleted

## 2014-11-15 NOTE — Telephone Encounter (Signed)
-----   Message from Ladell Pier, MD sent at 11/12/2014  5:40 PM EDT ----- Please call patient, chromogranin is better, f/u as scheduled

## 2014-11-15 NOTE — Telephone Encounter (Signed)
Called pt with chromogranin A results: Better, per Dr. Benay Spice. Follow up as scheduled. She voiced understanding.

## 2014-11-24 DIAGNOSIS — G629 Polyneuropathy, unspecified: Secondary | ICD-10-CM | POA: Diagnosis not present

## 2014-11-24 DIAGNOSIS — M797 Fibromyalgia: Secondary | ICD-10-CM | POA: Diagnosis not present

## 2014-12-09 ENCOUNTER — Telehealth: Payer: Self-pay | Admitting: Nutrition

## 2014-12-09 ENCOUNTER — Ambulatory Visit: Payer: Medicare Other | Admitting: Nutrition

## 2014-12-09 NOTE — Telephone Encounter (Signed)
VM left to return call to reschedule missed appointment. PC

## 2014-12-27 DIAGNOSIS — E1165 Type 2 diabetes mellitus with hyperglycemia: Secondary | ICD-10-CM | POA: Diagnosis not present

## 2014-12-27 DIAGNOSIS — E782 Mixed hyperlipidemia: Secondary | ICD-10-CM | POA: Diagnosis not present

## 2014-12-29 DIAGNOSIS — G4730 Sleep apnea, unspecified: Secondary | ICD-10-CM | POA: Diagnosis not present

## 2014-12-29 DIAGNOSIS — M797 Fibromyalgia: Secondary | ICD-10-CM | POA: Diagnosis not present

## 2014-12-29 DIAGNOSIS — R944 Abnormal results of kidney function studies: Secondary | ICD-10-CM | POA: Diagnosis not present

## 2014-12-29 DIAGNOSIS — E785 Hyperlipidemia, unspecified: Secondary | ICD-10-CM | POA: Diagnosis not present

## 2014-12-29 DIAGNOSIS — I10 Essential (primary) hypertension: Secondary | ICD-10-CM | POA: Diagnosis not present

## 2014-12-29 DIAGNOSIS — E119 Type 2 diabetes mellitus without complications: Secondary | ICD-10-CM | POA: Diagnosis not present

## 2014-12-29 DIAGNOSIS — K219 Gastro-esophageal reflux disease without esophagitis: Secondary | ICD-10-CM | POA: Diagnosis not present

## 2014-12-29 DIAGNOSIS — M205X2 Other deformities of toe(s) (acquired), left foot: Secondary | ICD-10-CM | POA: Diagnosis not present

## 2014-12-29 DIAGNOSIS — G589 Mononeuropathy, unspecified: Secondary | ICD-10-CM | POA: Diagnosis not present

## 2015-01-08 ENCOUNTER — Other Ambulatory Visit: Payer: Self-pay | Admitting: Oncology

## 2015-01-14 DIAGNOSIS — E119 Type 2 diabetes mellitus without complications: Secondary | ICD-10-CM | POA: Diagnosis not present

## 2015-01-14 DIAGNOSIS — M2010 Hallux valgus (acquired), unspecified foot: Secondary | ICD-10-CM | POA: Diagnosis not present

## 2015-01-14 DIAGNOSIS — L84 Corns and callosities: Secondary | ICD-10-CM | POA: Diagnosis not present

## 2015-01-14 DIAGNOSIS — L602 Onychogryphosis: Secondary | ICD-10-CM | POA: Diagnosis not present

## 2015-02-16 DIAGNOSIS — G4733 Obstructive sleep apnea (adult) (pediatric): Secondary | ICD-10-CM | POA: Diagnosis not present

## 2015-02-18 ENCOUNTER — Other Ambulatory Visit: Payer: Self-pay | Admitting: Oncology

## 2015-02-21 DIAGNOSIS — Z79899 Other long term (current) drug therapy: Secondary | ICD-10-CM | POA: Diagnosis not present

## 2015-02-21 DIAGNOSIS — D649 Anemia, unspecified: Secondary | ICD-10-CM | POA: Diagnosis not present

## 2015-02-21 DIAGNOSIS — I10 Essential (primary) hypertension: Secondary | ICD-10-CM | POA: Diagnosis not present

## 2015-02-21 DIAGNOSIS — N183 Chronic kidney disease, stage 3 (moderate): Secondary | ICD-10-CM | POA: Diagnosis not present

## 2015-02-21 DIAGNOSIS — E559 Vitamin D deficiency, unspecified: Secondary | ICD-10-CM | POA: Diagnosis not present

## 2015-02-21 DIAGNOSIS — R809 Proteinuria, unspecified: Secondary | ICD-10-CM | POA: Diagnosis not present

## 2015-02-23 DIAGNOSIS — I10 Essential (primary) hypertension: Secondary | ICD-10-CM | POA: Diagnosis not present

## 2015-02-23 DIAGNOSIS — E559 Vitamin D deficiency, unspecified: Secondary | ICD-10-CM | POA: Diagnosis not present

## 2015-02-23 DIAGNOSIS — N182 Chronic kidney disease, stage 2 (mild): Secondary | ICD-10-CM | POA: Diagnosis not present

## 2015-03-01 DIAGNOSIS — M2042 Other hammer toe(s) (acquired), left foot: Secondary | ICD-10-CM | POA: Diagnosis not present

## 2015-03-01 DIAGNOSIS — E1140 Type 2 diabetes mellitus with diabetic neuropathy, unspecified: Secondary | ICD-10-CM | POA: Diagnosis not present

## 2015-03-08 ENCOUNTER — Ambulatory Visit (HOSPITAL_BASED_OUTPATIENT_CLINIC_OR_DEPARTMENT_OTHER): Payer: Medicare Other | Admitting: Oncology

## 2015-03-08 ENCOUNTER — Other Ambulatory Visit: Payer: Medicare Other

## 2015-03-08 ENCOUNTER — Telehealth: Payer: Self-pay | Admitting: Oncology

## 2015-03-08 VITALS — BP 130/77 | HR 81 | Temp 98.9°F | Resp 18 | Ht 63.0 in | Wt 164.1 lb

## 2015-03-08 DIAGNOSIS — I10 Essential (primary) hypertension: Secondary | ICD-10-CM

## 2015-03-08 DIAGNOSIS — G4730 Sleep apnea, unspecified: Secondary | ICD-10-CM

## 2015-03-08 DIAGNOSIS — E119 Type 2 diabetes mellitus without complications: Secondary | ICD-10-CM | POA: Diagnosis not present

## 2015-03-08 DIAGNOSIS — C7A010 Malignant carcinoid tumor of the duodenum: Secondary | ICD-10-CM

## 2015-03-08 DIAGNOSIS — D3A098 Benign carcinoid tumors of other sites: Secondary | ICD-10-CM

## 2015-03-08 DIAGNOSIS — R197 Diarrhea, unspecified: Secondary | ICD-10-CM | POA: Diagnosis not present

## 2015-03-08 NOTE — Telephone Encounter (Signed)
per pof to sch pt appt-gave pt copy of avs °

## 2015-03-08 NOTE — Progress Notes (Signed)
  [facility] OFFICE PROGRESS NOTE   Diagnosis: Carcinoid tumor  INTERVAL HISTORY:   [name] returns as scheduled. She reports memory loss. She has increased "flushing "recently. She has constipation and diarrhea at times. No consistent diarrhea. Good appetite. She has discomfort at the abdominal incision.  Objective:  Vital signs in last 24 hours:  Blood pressure 130/77, pulse 81, temperature 98.9 F (37.2 C), temperature source Oral, resp. rate 18, height 5\' 3"  (1.6 m), weight 164 lb 1.6 oz (74.435 kg), SpO2 100 %.    HEENT: Neck without mass Lymphatics: No cervical or supraclavicular nodes Resp: Lungs clear bilaterally Cardio: Regular rhythm with premature beats GI: No hepatospleno megaly, nontender, no mass Vascular: No leg edema   Lab Results: 11/09/2014-chromogranin A: 122  Medications: I have reviewed the patient's current medications.  Assessment/Plan: 1. 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, clinical stage III (uT2, pN1), status post an endoscopic biopsy 02/04/2013 confirming a duodenal carcinoid tumor, status post a duodenal resection 03/31/2013 with no remaining tumor found in the duodenum and a positive portal lymph node   Repeat upper endoscopy/EUS on 08/20/2013 confirming a persistent duodenal nodule with a biopsy confirming a low-grade neuroendocrine carcinoma  Repeat upper endoscopy 09/27/2014 with a duodenal nodule proximal to a surgical scar, biopsy confirmed a well-differentiated neuroendocrine tumor 2. Diarrhea-intermittent, diet related-I doubt this is related to carcinoid disease  3. Diabetes  4. Sleep apnea  5. Fibromyalgia  6. Hypertension  7. Gastroesophageal reflux disease  8. Status post dilatation of a Schatzki's ring 02/04/2013  9. Vitiligo and hyperpigmented skin rash-evaluated by dermatology   Disposition:  Tiffany Ortiz appears stable. There is no clinical evidence  for progression of the carcinoid tumor. The chromogranin A level was stable in July. She did not discontinue dexilant prior to the blood draw today. She will stop the dexilant after today and return for a chromogranin A level on 03/14/2015. Tiffany Ortiz will be scheduled for an office visit in 6 months.  Betsy Coder, MD  03/08/2015  11:01 AM

## 2015-03-08 NOTE — Progress Notes (Signed)
Pt did not hold Dexilant prior to Chromagranin A being drawn. Notified lab to cancel Chromagranin drawn today.

## 2015-03-14 ENCOUNTER — Other Ambulatory Visit: Payer: Medicare Other

## 2015-03-15 ENCOUNTER — Telehealth: Payer: Self-pay | Admitting: *Deleted

## 2015-03-15 ENCOUNTER — Ambulatory Visit (HOSPITAL_BASED_OUTPATIENT_CLINIC_OR_DEPARTMENT_OTHER): Payer: Medicare Other

## 2015-03-15 ENCOUNTER — Telehealth: Payer: Self-pay | Admitting: Oncology

## 2015-03-15 DIAGNOSIS — D3A098 Benign carcinoid tumors of other sites: Secondary | ICD-10-CM | POA: Diagnosis not present

## 2015-03-15 DIAGNOSIS — C7A010 Malignant carcinoid tumor of the duodenum: Secondary | ICD-10-CM

## 2015-03-15 DIAGNOSIS — E119 Type 2 diabetes mellitus without complications: Secondary | ICD-10-CM

## 2015-03-15 NOTE — Telephone Encounter (Signed)
Spoke with pt to re-schedule lab only appt today; confirmed she has not taken her Dexilant and pt states she does want to come in this afternoon.  Pt reports she is having heartburn and "only thing that helps is the dexilant so I do need to come on in so I can start back on med"  Pt states she can be here at 3pm; POF sent to scheduler for lab @ 3pm.

## 2015-03-15 NOTE — Telephone Encounter (Signed)
Patient left message re r/s 11/7 lab appointment. Per patient she is having issues with her medication - the medication needs to be changed and the lab needs to be r/s. Left message informing desk nurse and also that I would wait to hear back from her before r/s lab - unknown if lab/medication is connected.

## 2015-03-19 LAB — CHROMOGRANIN A: CHROMOGRANIN A: 35 ng/mL — AB (ref <=15)

## 2015-03-21 ENCOUNTER — Telehealth: Payer: Self-pay | Admitting: *Deleted

## 2015-03-21 NOTE — Telephone Encounter (Signed)
-----   Message from Ladell Pier, MD sent at 03/20/2015  8:28 AM EST ----- Please call patient, chromagranin A is better, f/u as scheduled

## 2015-03-21 NOTE — Telephone Encounter (Signed)
Called pt with Chromagranin A results.Better, per Dr. Benay Spice. Pt voiced understanding.

## 2015-03-23 DIAGNOSIS — M2042 Other hammer toe(s) (acquired), left foot: Secondary | ICD-10-CM | POA: Diagnosis not present

## 2015-03-23 DIAGNOSIS — E119 Type 2 diabetes mellitus without complications: Secondary | ICD-10-CM | POA: Diagnosis not present

## 2015-03-23 DIAGNOSIS — L602 Onychogryphosis: Secondary | ICD-10-CM | POA: Diagnosis not present

## 2015-03-23 DIAGNOSIS — I70293 Other atherosclerosis of native arteries of extremities, bilateral legs: Secondary | ICD-10-CM | POA: Diagnosis not present

## 2015-03-23 DIAGNOSIS — L84 Corns and callosities: Secondary | ICD-10-CM | POA: Diagnosis not present

## 2015-04-06 DIAGNOSIS — Z23 Encounter for immunization: Secondary | ICD-10-CM | POA: Diagnosis not present

## 2015-05-04 DIAGNOSIS — H02831 Dermatochalasis of right upper eyelid: Secondary | ICD-10-CM | POA: Diagnosis not present

## 2015-05-04 DIAGNOSIS — H04123 Dry eye syndrome of bilateral lacrimal glands: Secondary | ICD-10-CM | POA: Diagnosis not present

## 2015-05-04 DIAGNOSIS — E119 Type 2 diabetes mellitus without complications: Secondary | ICD-10-CM | POA: Diagnosis not present

## 2015-05-04 DIAGNOSIS — H02833 Dermatochalasis of right eye, unspecified eyelid: Secondary | ICD-10-CM | POA: Diagnosis not present

## 2015-05-04 DIAGNOSIS — H2513 Age-related nuclear cataract, bilateral: Secondary | ICD-10-CM | POA: Diagnosis not present

## 2015-05-25 DIAGNOSIS — E119 Type 2 diabetes mellitus without complications: Secondary | ICD-10-CM | POA: Diagnosis not present

## 2015-05-25 DIAGNOSIS — I10 Essential (primary) hypertension: Secondary | ICD-10-CM | POA: Diagnosis not present

## 2015-05-25 DIAGNOSIS — F411 Generalized anxiety disorder: Secondary | ICD-10-CM | POA: Diagnosis not present

## 2015-05-25 DIAGNOSIS — F339 Major depressive disorder, recurrent, unspecified: Secondary | ICD-10-CM | POA: Diagnosis not present

## 2015-05-30 DIAGNOSIS — E1351 Other specified diabetes mellitus with diabetic peripheral angiopathy without gangrene: Secondary | ICD-10-CM | POA: Diagnosis not present

## 2015-05-30 DIAGNOSIS — L602 Onychogryphosis: Secondary | ICD-10-CM | POA: Diagnosis not present

## 2015-05-30 DIAGNOSIS — L84 Corns and callosities: Secondary | ICD-10-CM | POA: Diagnosis not present

## 2015-06-20 DIAGNOSIS — E1165 Type 2 diabetes mellitus with hyperglycemia: Secondary | ICD-10-CM | POA: Diagnosis not present

## 2015-07-11 ENCOUNTER — Ambulatory Visit (HOSPITAL_BASED_OUTPATIENT_CLINIC_OR_DEPARTMENT_OTHER): Payer: Medicare Other | Attending: Internal Medicine | Admitting: Radiology

## 2015-07-11 DIAGNOSIS — G4730 Sleep apnea, unspecified: Secondary | ICD-10-CM

## 2015-07-11 DIAGNOSIS — R0683 Snoring: Secondary | ICD-10-CM | POA: Insufficient documentation

## 2015-07-11 DIAGNOSIS — I10 Essential (primary) hypertension: Secondary | ICD-10-CM | POA: Diagnosis not present

## 2015-07-11 DIAGNOSIS — Z79899 Other long term (current) drug therapy: Secondary | ICD-10-CM | POA: Insufficient documentation

## 2015-07-11 DIAGNOSIS — R5383 Other fatigue: Secondary | ICD-10-CM | POA: Insufficient documentation

## 2015-07-11 DIAGNOSIS — G4733 Obstructive sleep apnea (adult) (pediatric): Secondary | ICD-10-CM | POA: Diagnosis not present

## 2015-07-11 DIAGNOSIS — E119 Type 2 diabetes mellitus without complications: Secondary | ICD-10-CM | POA: Insufficient documentation

## 2015-07-13 DIAGNOSIS — E340 Carcinoid syndrome: Secondary | ICD-10-CM | POA: Diagnosis not present

## 2015-07-13 DIAGNOSIS — E1165 Type 2 diabetes mellitus with hyperglycemia: Secondary | ICD-10-CM | POA: Diagnosis not present

## 2015-07-13 DIAGNOSIS — K210 Gastro-esophageal reflux disease with esophagitis: Secondary | ICD-10-CM | POA: Diagnosis not present

## 2015-07-17 DIAGNOSIS — G4733 Obstructive sleep apnea (adult) (pediatric): Secondary | ICD-10-CM | POA: Diagnosis not present

## 2015-07-17 NOTE — Progress Notes (Signed)
Patient Name: Tiffany Ortiz, Tiffany Ortiz Date: 07/11/2015 Gender: Female D.O.B: Dec 09, 1945 Age (years): 69 Referring Provider: Delphina Cahill Height (inches): 18 Interpreting Physician: Baird Lyons MD, ABSM Weight (lbs): 164 RPSGT: [name] BMI: 29 MRN: [id number] Neck Size: 13.00 CLINICAL INFORMATION Sleep Study Type: Split Night CPAP   Indication for sleep study: Diabetes, Fatigue, Hypertension, OSA, Snoring   Epworth Sleepiness Score:  16/24  SLEEP STUDY TECHNIQUE As per the AASM Manual for the Scoring of Sleep and Associated Events v2.3 (April 2016) with a hypopnea requiring 4% desaturations. The channels recorded and monitored were frontal, central and occipital EEG, electrooculogram (EOG), submentalis EMG (chin), nasal and oral airflow, thoracic and abdominal wall motion, anterior tibialis EMG, snore microphone, electrocardiogram, and pulse oximetry. Continuous positive airway pressure (CPAP) was initiated when the patient met split night criteria and was titrated according to treat sleep-disordered breathing.  MEDICATIONS Medications taken by the patient : charted for rview Medications administered by patient during sleep study : Xanax, Gabapentin, Melatonin, DHEA  RESPIRATORY PARAMETERS Diagnostic Total AHI (/hr): 13.1 RDI (/hr): 17.7 OA Index (/hr): 0.8 CA Index (/hr): 0.0 REM AHI (/hr): 32.3 NREM AHI (/hr): 6.7 Supine AHI (/hr): 13.1 Non-supine AHI (/hr): N/A Min O2 Sat (%): 85.00 Mean O2 (%): 93.05 Time below 88% (min): 4.2   Titration Optimal Pressure (cm): 13 AHI at Optimal Pressure (/hr): 0.0 Min O2 at Optimal Pressure (%): 93.0 Supine % at Optimal (%): 2 Sleep % at Optimal (%): 98    SLEEP ARCHITECTURE The recording time for the entire night was 354.3 minutes. During a baseline period of 188.9 minutes, the patient slept for 155.7 minutes in REM and nonREM, yielding a sleep efficiency of 82.4%. Sleep onset after lights out was 31.7 minutes with a REM latency  of 76.5 minutes. The patient spent 10.92% of the night in stage N1 sleep, 64.04% in stage N2 sleep, 0.00% in stage N3 and 25.04% in REM. During the titration period of 158.1 minutes, the patient slept for 146.6 minutes in REM and nonREM, yielding a sleep efficiency of 92.7%. Sleep onset after CPAP initiation was 7.5 minutes with a REM latency of 57.0 minutes. The patient spent 8.87% of the night in stage N1 sleep, 50.20% in stage N2 sleep, 0.00% in stage N3 and 40.93% in REM.  CARDIAC DATA The 2 lead EKG demonstrated sinus rhythm. The mean heart rate was 67.73 beats per minute. Other EKG findings include: PACs.  LEG MOVEMENT DATA The total Periodic Limb Movements of Sleep (PLMS) were 0. The PLMS index was 0.00 .  IMPRESSIONS - Mild obstructive sleep apnea occurred during the diagnostic portion of the study (AHI = 13.1 /hour). An optimal PAP pressure was selected for this patient ( 13 cm of water) - No significant central sleep apnea occurred during the diagnostic portion of the study (CAI = 0.0/hour). - Moderate oxygen desaturation was noted during the diagnostic portion of the study (Min O2 =85.00%). - The patient snored with Moderate snoring volume during the diagnostic portion of the study. - No cardiac abnormalities were noted during this study. - Clinically significant periodic limb movements did not occur during sleep.  DIAGNOSIS - Obstructive Sleep Apnea (327.23 [G47.33 ICD-10])  RECOMMENDATIONS - Trial of CPAP therapy on 13 cm H2O with a Small size Resmed Full Face Mask Quattro FX mask and heated humidification. - Avoid alcohol, sedatives and other CNS depressants that may worsen sleep apnea and disrupt normal sleep architecture. - Sleep hygiene should be reviewed to assess factors that may  improve sleep quality. - Weight management and regular exercise should be initiated or continued.  Deneise Lever Diplomate, American Board of Sleep Medicine  ELECTRONICALLY SIGNED ON:   07/17/2015, 2:16 PM [place] PH: [phone]   FX: [phone] [place]

## 2015-07-28 DIAGNOSIS — L602 Onychogryphosis: Secondary | ICD-10-CM | POA: Diagnosis not present

## 2015-07-28 DIAGNOSIS — L84 Corns and callosities: Secondary | ICD-10-CM | POA: Diagnosis not present

## 2015-07-28 DIAGNOSIS — I70293 Other atherosclerosis of native arteries of extremities, bilateral legs: Secondary | ICD-10-CM | POA: Diagnosis not present

## 2015-07-28 DIAGNOSIS — E1351 Other specified diabetes mellitus with diabetic peripheral angiopathy without gangrene: Secondary | ICD-10-CM | POA: Diagnosis not present

## 2015-08-11 ENCOUNTER — Telehealth: Payer: Self-pay | Admitting: Oncology

## 2015-08-11 NOTE — Telephone Encounter (Signed)
returned call no vm available °

## 2015-08-15 DIAGNOSIS — E119 Type 2 diabetes mellitus without complications: Secondary | ICD-10-CM | POA: Diagnosis not present

## 2015-08-15 DIAGNOSIS — I10 Essential (primary) hypertension: Secondary | ICD-10-CM | POA: Diagnosis not present

## 2015-08-15 DIAGNOSIS — E559 Vitamin D deficiency, unspecified: Secondary | ICD-10-CM | POA: Diagnosis not present

## 2015-08-17 DIAGNOSIS — C7A010 Malignant carcinoid tumor of the duodenum: Secondary | ICD-10-CM | POA: Diagnosis not present

## 2015-08-17 DIAGNOSIS — N183 Chronic kidney disease, stage 3 (moderate): Secondary | ICD-10-CM | POA: Diagnosis not present

## 2015-08-17 DIAGNOSIS — M5430 Sciatica, unspecified side: Secondary | ICD-10-CM | POA: Diagnosis not present

## 2015-08-17 DIAGNOSIS — E782 Mixed hyperlipidemia: Secondary | ICD-10-CM | POA: Diagnosis not present

## 2015-08-22 ENCOUNTER — Ambulatory Visit: Payer: Medicare Other | Admitting: Physician Assistant

## 2015-08-25 ENCOUNTER — Other Ambulatory Visit: Payer: Medicare Other

## 2015-08-25 ENCOUNTER — Ambulatory Visit (INDEPENDENT_AMBULATORY_CARE_PROVIDER_SITE_OTHER): Payer: Medicare Other | Admitting: Physician Assistant

## 2015-08-25 ENCOUNTER — Encounter: Payer: Self-pay | Admitting: Physician Assistant

## 2015-08-25 VITALS — BP 110/62 | HR 80 | Ht 62.5 in | Wt 159.6 lb

## 2015-08-25 DIAGNOSIS — R1084 Generalized abdominal pain: Secondary | ICD-10-CM | POA: Diagnosis not present

## 2015-08-25 DIAGNOSIS — R197 Diarrhea, unspecified: Secondary | ICD-10-CM | POA: Diagnosis not present

## 2015-08-25 MED ORDER — SUCRALFATE 1 GM/10ML PO SUSP: ORAL | Status: DC

## 2015-08-25 MED ORDER — DIPHENOXYLATE-ATROPINE 2.5-0.025 MG PO TABS: ORAL_TABLET | ORAL | Status: DC

## 2015-08-25 NOTE — Progress Notes (Signed)
Patient ID: Tiffany Ortiz, female   DOB: [birth year], 70 y.o.   MRN: [id number]   Subjective:    Patient ID: Tiffany Ortiz, female    DOB: 1945/11/07, 70 y.o.   MRN: [id number]  HPI  [name] is a pleasant 70 year old African-American female known to Dr. Ardis Hughs. She comes in today with complaints of progressive diarrhea and frequent flushing episodes. She was diagnosed with a duodenal carcinoid in 2014 and underwent resection. Repeat EGD in April 2015 due to complaints showed a persistent duodenal nodule and biopsy proved low-grade neuroendocrine carcinoma. Follow-up EGD in May 2016 and biopsy showed well-differentiated carcinoid. He is been followed by Dr. Benay Spice with expectant management thus far. Last office visit there was in November 2016. He spell to have a well-differentiated carcinoid the proximal duodenum stage III. He has had some complaints of diarrhea but not felt to have an overt carcinoid syndrome to date. Her last chromogen A level was 35. Last colonoscopy October 2014 by Dr. Sydell Axon rectal and colon polyps all hyperplastic and pan diverticulosis. Random biopsies were done for complaints of diarrhea and these were negative for microscopic colitis. Patient states that over the past 4-5 months she has had ongoing problems with diarrhea at times multiple episodes per day. She says sometimes she'll start out with a mostly formed bowel movement and then will have several subsequent bowel movements would get which get progressively more watery. She is also having frequent daily flushing episodes seems to bother her a lot at night. Appetite has been fair her weight is down a few pounds over the past few months. She has some intermittent right-sided abdominal discomfort. Appetite she's tried Imodium and Pepto-Bismol without benefit.   Patient has prescription for But says she has not been taking this, no recent antibiotics  Review of Systems Pertinent positive and negative review of  systems were noted in the above HPI section.  All other review of systems was otherwise negative.  Outpatient Encounter Prescriptions as of 08/25/2015  Medication Sig  . ALPRAZolam (XANAX) 1 MG tablet Take 0.5-1 mg by mouth at bedtime as needed for anxiety.   Marland Kitchen antiseptic oral rinse (BIOTENE) LIQD 15 mLs by Mouth Rinse route as needed for dry mouth.  . B Complex-C (SUPER B COMPLEX PO) Take 1 tablet by mouth daily.  Marland Kitchen bismuth subsalicylate (BISMATROL) 262 MG/15ML suspension Take by mouth.  . cetirizine (ZYRTEC) 10 MG tablet Take 10 mg by mouth daily.  . chlorthalidone (HYGROTON) 25 MG tablet Take 25 mg by mouth daily with breakfast.   . Cholecalciferol (VITAMIN D PO) Take 5,000 Units by mouth once a week. Mondays  . DEXILANT 60 MG capsule TAKE (1) CAPSULE BY MOUTH ONCE DAILY.  . fenofibrate 160 MG tablet Take 160 mg by mouth daily.  Marland Kitchen FLUoxetine (PROZAC) 10 MG capsule Take 10 mg by mouth daily.  Marland Kitchen gabapentin (NEURONTIN) 300 MG capsule Take 300 mg by mouth 2 (two) times daily.   Marland Kitchen glipiZIDE (GLUCOTROL XL) 5 MG 24 hr tablet Take 5 mg by mouth daily.  . Inulin (FIBER CHOICE FRUITY BITES) 1.5 g CHEW Chew 1 tablet by mouth daily.  Marland Kitchen linagliptin (TRADJENTA) 5 MG TABS tablet Take 5 mg by mouth daily.  Marland Kitchen losartan (COZAAR) 100 MG tablet Take 100 mg by mouth every morning.   Marland Kitchen MELATONIN PO Take by mouth at bedtime.  . Nutritional Supplements (DHEA PO) Take 1 tablet by mouth at bedtime.  . Omega-3 Fatty Acids (FISH OIL PO) Take by mouth.  Marland Kitchen  ondansetron (ZOFRAN) 4 MG tablet Take 4 mg by mouth every 8 (eight) hours as needed for nausea or vomiting.  Marland Kitchen oxymetazoline (AFRIN) 0.05 % nasal spray Place 1 spray into both nostrils 2 (two) times daily as needed for congestion.  . pravastatin (PRAVACHOL) 20 MG tablet Take 10 mg by mouth daily.   Marland Kitchen PREVNAR 13 SUSP injection   . Probiotic Product (RESTORA PO) Take 1 tablet by mouth daily.  Marland Kitchen Propylene Glycol-Glycerin (SOOTHE OP) Place 1 drop into both eyes 2 (two)  times daily.  . sucralfate (CARAFATE) 1 g tablet Take 1 g by mouth 4 (four) times daily -  with meals and at bedtime.  . triamcinolone cream (KENALOG) 0.1 % Apply 1 application topically 2 (two) times daily.  . valACYclovir (VALTREX) 500 MG tablet Take 500 mg by mouth as needed.  . verapamil (CALAN-SR) 240 MG CR tablet Take 240 mg by mouth every morning.   . diphenoxylate-atropine (LOMOTIL) 2.5-0.025 MG tablet Take 1 tablet every morning and up to 4 tablets daily as needed.  . sucralfate (CARAFATE) 1 GM/10ML suspension Take 10 ml's ,3 times daily between meals.  . [DISCONTINUED] amphetamine-dextroamphetamine (ADDERALL) 20 MG tablet Reported on 08/25/2015   No facility-administered encounter medications on file as of 08/25/2015.   Allergies  Allergen Reactions  . Other     Metal alloy-breaks her out-esp. Metal surgical instruments or some metal implanted in me  . Asa [Aspirin]     Kidney doctor told her to not take ASA  . Erythromycin     Doesn't remember   . Famciclovir     migrane  . Hydrocodone     "makes me lose my mind"-hallucinations  . Ibuprofen     Kidney doctor told her to not take ibuprofen.  . Lactase Diarrhea  . Lactose Intolerance (Gi) Diarrhea and Other (See Comments)    Severe diarrhea  . Morphine And Related Other (See Comments)    Pt says Morphine makes her go out of her head  . Oxycodone     hallucinations  . Promethazine Nausea And Vomiting  . Tylenol [Acetaminophen]     Knocks her out.   Patient Active Problem List   Diagnosis Date Noted  . Carcinoid tumor of intestine ([place]) 03/31/2013  . Nonspecific (abnormal) findings on radiological and other examination of gastrointestinal tract 02/12/2013  . Chronic diarrhea 01/28/2013  . GERD (gastroesophageal reflux disease) 01/28/2013  . Esophageal dysphagia 01/28/2013   Social History   Social History  . Marital Status: Married    Spouse Name: N/A  . Number of Children: N/A  . Years of Education: N/A    Occupational History  . Not on file.   Social History Main Topics  . Smoking status: Former Smoker    Types: Cigarettes  . Smokeless tobacco: Former Systems developer    Quit date: 02/11/1970  . Alcohol Use: No  . Drug Use: No  . Sexual Activity: Not on file   Other Topics Concern  . 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 Aplin's family history includes Cancer in her father and mother. There is no history of Colon cancer.      Objective:    Filed Vitals:   08/25/15 0912  BP: 110/62  Pulse: 80    Physical Exam  well-developed older African-American female in no  acute distress, accompanied by her daughter blood pressure 110/62 pulse 80 height 5 foot 2 weight 159 HEENT; nontraumatic, cephalic EOMI PERRLA sclera anicteric, Cardiovascular; regular rate and rhythm with S1-S2 no murmur or gallop, Pulmonary; clear bilaterally, Abdomen; soft, no focal tenderness no guarding or rebound no palpable mass or hepatosplenomegaly bowel sounds are present, midline incisional scar, Rectal ;exam not done, Ext;no clubbing cyanosis or edema skin warm and dry, Neuropsych ;mood and affect appropriate     Assessment & Plan:   #53 70 year old female with known well-differentiated duodenal carcinoid stage III presenting with progressive complaints of persistent diarrhea, over the past few months, abdominal burning, and frequent episodes of flushing and low-grade weight loss. Am concerned that she may be developing carcinoid syndrome and will need further evaluation to rule out metastatic carcinoid. #2  adult-onset diabetes mellitus with neuropathy #3 fibromyalgia #4 diverticulosis  Plan; stool for C. difficile PCR, stool lactoferrin  fecal elastase 24 hour urine for 5 HIAA quantitative Start Lomotil 1 by mouth every morning then up to 4 daily when necessary for  diarrhea Carafate liquid 1 by mouth 3 times a day between meals Patient has scheduled follow-up with Dr. Benay Spice in early May and will keep that appointment, we will defer to oncology regarding repeat chromogen A level and octreotide scan etc.    Alfredia Ferguson PA-C 08/25/2015   Cc: Celene Squibb, MD

## 2015-08-25 NOTE — Patient Instructions (Addendum)
Please go to the basement level to have your labs drawn.   We sent prescriptions to [facility] [place]. 1. Carafate liquid 2. Lomotil for diarrhea  Stay off the [place].   If you are age 71 or older, your body mass index should be between 23-30. Your Body mass index is 28.71 kg/(m^2). If this is out of the aforementioned range listed, please consider follow up with your Primary Care Provider.

## 2015-08-26 LAB — CLOSTRIDIUM DIFFICILE BY PCR: Toxigenic C. Difficile by PCR: NOT DETECTED

## 2015-08-26 LAB — FECAL LACTOFERRIN, QUANT: LACTOFERRIN: NEGATIVE

## 2015-08-26 NOTE — Progress Notes (Signed)
i agree with the above note, plan 

## 2015-08-29 ENCOUNTER — Telehealth: Payer: Self-pay | Admitting: Physician Assistant

## 2015-08-29 NOTE — Telephone Encounter (Signed)
Patient spilled out one of the urine specimens collected for her 24 hr 5 HIAA urine test. She will start over her collection.

## 2015-08-30 ENCOUNTER — Other Ambulatory Visit: Payer: Medicare Other

## 2015-08-30 DIAGNOSIS — R197 Diarrhea, unspecified: Secondary | ICD-10-CM

## 2015-08-30 DIAGNOSIS — R1084 Generalized abdominal pain: Secondary | ICD-10-CM

## 2015-08-31 DIAGNOSIS — E559 Vitamin D deficiency, unspecified: Secondary | ICD-10-CM | POA: Diagnosis not present

## 2015-08-31 DIAGNOSIS — N181 Chronic kidney disease, stage 1: Secondary | ICD-10-CM | POA: Diagnosis not present

## 2015-09-01 LAB — PANCREATIC ELASTASE, FECAL: Pancreatic Elastase-1, Stool: 190 mcg/g — ABNORMAL LOW

## 2015-09-03 LAB — 5 HIAA, QUANTITATIVE, URINE, 24 HOUR: 5-HIAA, URINE: 0.8 mg/(24.h) (ref <=6.0)

## 2015-09-05 ENCOUNTER — Ambulatory Visit (HOSPITAL_BASED_OUTPATIENT_CLINIC_OR_DEPARTMENT_OTHER): Payer: Medicare Other | Admitting: Oncology

## 2015-09-05 ENCOUNTER — Other Ambulatory Visit: Payer: Medicare Other

## 2015-09-05 ENCOUNTER — Telehealth: Payer: Self-pay | Admitting: Oncology

## 2015-09-05 VITALS — BP 153/98 | HR 80 | Temp 98.2°F | Resp 18 | Ht 62.5 in | Wt 159.7 lb

## 2015-09-05 DIAGNOSIS — C7A010 Malignant carcinoid tumor of the duodenum: Secondary | ICD-10-CM | POA: Diagnosis not present

## 2015-09-05 DIAGNOSIS — D3A098 Benign carcinoid tumors of other sites: Secondary | ICD-10-CM

## 2015-09-05 DIAGNOSIS — I10 Essential (primary) hypertension: Secondary | ICD-10-CM

## 2015-09-05 DIAGNOSIS — E119 Type 2 diabetes mellitus without complications: Secondary | ICD-10-CM | POA: Diagnosis not present

## 2015-09-05 DIAGNOSIS — R197 Diarrhea, unspecified: Secondary | ICD-10-CM

## 2015-09-05 NOTE — Progress Notes (Signed)
  [facility] OFFICE PROGRESS NOTE   Diagnosis:  Carcinoid tumor  INTERVAL HISTORY:    Ms. Zegers returns as scheduled. She complains of intermittent diarrhea for the past month. She cannot be specific about the number of loose stools per day. The stool volume is variable. She complains of substernal burning discomfort. She no longer takes Dexilant. She was prescribed Carafate by gastroenterology.   she reports intermittent hot flashes. A 24 urine 5 HIAA on 08/30/2015 return in the normal range at 0.8. A stool sample for the C. Difficile toxin was negative for 25 2017. The stool pancreatic elastase was mildly low at 190.  Objective:  Vital signs in last 24 hours:  Blood pressure 153/98, pulse 80, temperature 98.2 F (36.8 C), temperature source Oral, resp. rate 18, height 5' 2.5" (1.588 m), weight 159 lb 11.2 oz (72.439 kg), SpO2 98 %.    HEENT:  Neck without mass Resp:  Lungs clear bilaterally Cardio:  Regular rate and rhythm GI:  No hepatomegaly, mild tenderness in the upper abdomen bilaterally, no mass Vascular:  No leg edema   Medications: I have reviewed the patient's current medications.  Assessment/Plan: 1. 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, clinical stage III (uT2, pN1), status post an endoscopic biopsy 02/04/2013 confirming a duodenal carcinoid tumor, status post a duodenal resection 03/31/2013 with no remaining tumor found in the duodenum and a positive portal lymph node   Repeat upper endoscopy/EUS on 08/20/2013 confirming a persistent duodenal nodule with a biopsy confirming a low-grade neuroendocrine carcinoma  Repeat upper endoscopy 09/27/2014 with a duodenal nodule proximal to a surgical scar, biopsy confirmed a well-differentiated neuroendocrine tumor 2. Diarrhea-intermittent-I doubt this is related to carcinoid disease  3. Diabetes  4. Sleep apnea  5. Fibromyalgia  6. Hypertension  7.  Gastroesophageal reflux disease  8. Status post dilatation of a Schatzki's ring 02/04/2013  9. Vitiligo and hyperpigmented skin rash-evaluated by dermatology    Disposition:   Ms. Mitchell has a history of a carcinoid tumor of the proximal duodenum. I doubt the intermittent diarrhea is related to carcinoid syndrome. We will follow-up on the chromogranin A level from today. A 24-hour urine 5 HIAA was normal last week.    she was prescribed Lomotil by gastroenterology. I doubt she would benefit from pancreatic enzyme replacement. Ms. Cogbill will return for an office visit in 6 months. She and her daughter will contact us if she develops consistent diarrhea and we will arrange for a CT abdomen.    Betsy Coder, MD  09/05/2015  1:57 PM

## 2015-09-05 NOTE — Telephone Encounter (Signed)
no voicemail.. mailed pt appt confrimation letter

## 2015-09-07 LAB — CHROMOGRANIN A: CHROMOGRAN A: 8 nmol/L — AB (ref 0–5)

## 2015-09-08 ENCOUNTER — Telehealth: Payer: Self-pay | Admitting: *Deleted

## 2015-09-08 LAB — CHROMOGRANIN A (PARALLEL TESTING): CHROMOGRANIN A: 55 ng/mL — AB (ref <=15)

## 2015-09-08 NOTE — Telephone Encounter (Signed)
Notified pt of Chromagranin A result- Normal, per Dr. Benay Spice. She voiced appreciation for call.

## 2015-09-08 NOTE — Telephone Encounter (Signed)
-----   Message from Tiffany Pier, MD sent at 09/07/2015  7:52 PM EDT ----- Please call patient, chromogranin is normal

## 2015-09-12 ENCOUNTER — Telehealth: Payer: Self-pay | Admitting: *Deleted

## 2015-09-12 NOTE — Telephone Encounter (Signed)
Informed pt of stable Chromagranin from [facility] lab. She voiced understanding.

## 2015-09-12 NOTE — Telephone Encounter (Signed)
-----   Message from Ladell Pier, MD sent at 09/08/2015 10:18 PM EDT ----- Please call patient, chromogranin is stable

## 2015-09-14 DIAGNOSIS — E1165 Type 2 diabetes mellitus with hyperglycemia: Secondary | ICD-10-CM | POA: Diagnosis not present

## 2015-09-29 DIAGNOSIS — L84 Corns and callosities: Secondary | ICD-10-CM | POA: Diagnosis not present

## 2015-09-29 DIAGNOSIS — L602 Onychogryphosis: Secondary | ICD-10-CM | POA: Diagnosis not present

## 2015-09-29 DIAGNOSIS — E1140 Type 2 diabetes mellitus with diabetic neuropathy, unspecified: Secondary | ICD-10-CM | POA: Diagnosis not present

## 2015-10-20 ENCOUNTER — Encounter: Payer: Self-pay | Admitting: Gastroenterology

## 2015-10-24 DIAGNOSIS — E1165 Type 2 diabetes mellitus with hyperglycemia: Secondary | ICD-10-CM | POA: Diagnosis not present

## 2015-10-27 ENCOUNTER — Telehealth: Payer: Self-pay | Admitting: Physician Assistant

## 2015-10-27 NOTE — Telephone Encounter (Signed)
She takes the Lomotil four times a daily. Up to 7 stools that are soft form to watery. It happens when she eats or not. What do you recommend?

## 2015-10-27 NOTE — Telephone Encounter (Signed)
Dan, please review - I really thought this was carcinoid syndrome- 5HIAA  Was normal- but she has hx, and has severe diarrhea- Lomotil 4 times daily not controlling ... Suggestions?

## 2015-10-31 ENCOUNTER — Ambulatory Visit (INDEPENDENT_AMBULATORY_CARE_PROVIDER_SITE_OTHER): Payer: Medicare Other | Admitting: Physician Assistant

## 2015-10-31 ENCOUNTER — Encounter: Payer: Self-pay | Admitting: Physician Assistant

## 2015-10-31 ENCOUNTER — Other Ambulatory Visit (INDEPENDENT_AMBULATORY_CARE_PROVIDER_SITE_OTHER): Payer: Medicare Other

## 2015-10-31 ENCOUNTER — Other Ambulatory Visit: Payer: Self-pay

## 2015-10-31 VITALS — BP 110/60 | HR 68 | Ht 62.0 in | Wt 157.0 lb

## 2015-10-31 DIAGNOSIS — D3A00 Benign carcinoid tumor of unspecified site: Secondary | ICD-10-CM

## 2015-10-31 DIAGNOSIS — K529 Noninfective gastroenteritis and colitis, unspecified: Secondary | ICD-10-CM

## 2015-10-31 LAB — BASIC METABOLIC PANEL
BUN: 27 mg/dL — AB (ref 6–23)
CO2: 24 mEq/L (ref 19–32)
CREATININE: 1.08 mg/dL (ref 0.40–1.20)
Calcium: 10 mg/dL (ref 8.4–10.5)
Chloride: 109 mEq/L (ref 96–112)
GFR: 64.53 mL/min (ref >60.00)
Glucose, Bld: 119 mg/dL — ABNORMAL HIGH (ref 70–99)
Potassium: 3.1 mEq/L — ABNORMAL LOW (ref 3.5–5.1)
Sodium: 141 mEq/L (ref 135–145)

## 2015-10-31 MED ORDER — DIPHENOXYLATE-ATROPINE 2.5-0.025 MG PO TABS: ORAL_TABLET | ORAL | Status: DC

## 2015-10-31 MED ORDER — POTASSIUM CHLORIDE ER 10 MEQ PO TBCR: 10.0000 meq | EXTENDED_RELEASE_TABLET | Freq: Every day | ORAL | Status: DC

## 2015-10-31 NOTE — Patient Instructions (Addendum)
Please go to the basement level to have your labs drawn.  We faxed a prescription to [facility] for Lomotil tablets.  Take the Creon 6000/  Increase to 2 tablets meals and 1 tablet with snacks.   You have been scheduled for a CT scan of the abdomen and pelvis at [facility] ([address]---this is in the same building as Press photographer).   You are scheduled on Wednesday 11-02-2015 at 11:00 am. You should arrive at 10:45 minutes prior to your appointment time for registration. Please follow the written instructions below on the day of your exam:  WARNING: IF YOU ARE ALLERGIC TO IODINE/X-RAY DYE, PLEASE NOTIFY RADIOLOGY IMMEDIATELY AT [phone]! YOU WILL BE GIVEN A 13 HOUR PREMEDICATION PREP.  1) Do not eat or drink anything after 7:00 am(4 hours prior to your test) 2) You have been given 2 bottles of oral contrast to drink. The solution may taste better if refrigerated, but do NOT add ice or any other liquid to this solution. Shake well before drinking.    Drink 1 bottle of contrast @  9:00 am(2 hours prior to your exam)  Drink 1 bottle of contrast @ 10:00 am (1 hour prior to your exam)  You may take any medications as prescribed with a small amount of water except for the following: Metformin, Glucophage, Glucovance, Avandamet, Riomet, Fortamet, Actoplus Met, Janumet, Glumetza or Metaglip. The above medications must be held the day of the exam AND 48 hours after the exam.  The purpose of you drinking the oral contrast is to aid in the visualization of your intestinal tract. The contrast solution may cause some diarrhea. Before your exam is started, you will be given a small amount of fluid to drink. Depending on your individual set of symptoms, you may also receive an intravenous injection of x-ray contrast/dye. Plan on being at [facility] for 30 minutes or long, depending on the type of exam you are having performed.  If you have any questions regarding  your exam or if you need to reschedule, you may call the CT department at [phone] between the hours of 8:00 am and 5:00 pm, Monday-Friday.  ________________________________________________________________________

## 2015-10-31 NOTE — Progress Notes (Signed)
Patient ID: Tiffany Ortiz, female   DOB: 13-Dec-1945, 70 y.o.   MRN: [id number]   Subjective:    Patient ID: Tiffany Ortiz, female    DOB: Feb 17, 1946, 70 y.o.   MRN: [id number]  HPI Tiffany Ortiz is a pleasant 70 year old African-American female known to Dr. Ardis Hughs. She is also known to Dr. Benay Spice. She has history of a duodenal carcinoid which was diagnosed in 2014. She had this resected . Most recent  EGD in 09/2014 with biopsy showed a persistent duodenal nodule and biopsy proved a well-differentiated carcinoid. 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 In the past she had had some complaints of diarrhea but was not felt to have an overt carcinoid syndrome. She was seen in our office in April 2017 stating that she was having diarrhea over the past 4-5 months. She also endorsed some episodes of flushing especially at night. Stool for C. Difficile was negative, lactoferrin within normal limits fecal elastase was slightly decreased at 190. 24 hour urine for 5-HIAA elected in this resulted is normal. She was started on Lomotil up to 4 times daily for diarrhea. She has since had a follow-up with Dr. Benay Spice and also had chromogen a levels done. The initial result was normal and then a "parallel testing" resulted elevated at 55. She's not had any recent imaging. She comes back in today stating that she's been having constant diarrhea usually 6-7 times per day watery to loose stool on a good day she'll have 3-4 loose bowel movements per day and she has an ongoing feeling of rumbling "rolling" in her abdomen .This is been despite taking Lomotil 4 tablets daily. She had seen her PCP Dr. Carlis Abbott who also started her on a trial of Creon just within the past few weeks. She is only been taking one with each meal but has not noticed any change. Weight is down 2 pounds from last office visit   last colonoscopy October 2014 per Dr. Sydell Axon with finding  of hyperplastic polyps and pandiverticulosis  Review of Systems Pertinent positive and negative review of systems were noted in the above HPI section.  All other review of systems was otherwise negative.  Outpatient Encounter Prescriptions as of 10/31/2015  Medication Sig  . ALPRAZolam (XANAX) 1 MG tablet Take 0.5-1 mg by mouth at bedtime as needed for anxiety.   Marland Kitchen antiseptic oral rinse (BIOTENE) LIQD 15 mLs by Mouth Rinse route as needed for dry mouth.  . B Complex-C (SUPER B COMPLEX PO) Take 1 tablet by mouth daily.  Marland Kitchen bismuth subsalicylate (BISMATROL) 262 MG/15ML suspension Take by mouth.  . chlorthalidone (HYGROTON) 25 MG tablet Take 25 mg by mouth daily with breakfast.   . Cholecalciferol (VITAMIN D PO) Take 5,000 Units by mouth once a week. Mondays  . diphenoxylate-atropine (LOMOTIL) 2.5-0.025 MG tablet Take 2 tablets  up to 4 tablets daily as needed.  Marland Kitchen escitalopram (LEXAPRO) 20 MG tablet Take 20 mg by mouth daily.  . fenofibrate 160 MG tablet Take 160 mg by mouth daily.  Marland Kitchen gabapentin (NEURONTIN) 300 MG capsule Take 300 mg by mouth daily.   Marland Kitchen glipiZIDE (GLUCOTROL XL) 5 MG 24 hr tablet Take 5 mg by mouth daily.  . Inulin (FIBER CHOICE FRUITY BITES) 1.5 g CHEW Chew 1 tablet by mouth daily.  Marland Kitchen losartan (COZAAR) 100 MG tablet Take 100 mg by mouth every morning.   Marland Kitchen MELATONIN PO Take by mouth at bedtime.  . Nutritional Supplements (DHEA  PO) Take 1 tablet by mouth at bedtime.  . ondansetron (ZOFRAN) 4 MG tablet Take 4 mg by mouth every 8 (eight) hours as needed for nausea or vomiting.  Marland Kitchen oxymetazoline (AFRIN) 0.05 % nasal spray Place 1 spray into both nostrils 2 (two) times daily as needed for congestion.  . pravastatin (PRAVACHOL) 20 MG tablet Take 10 mg by mouth daily.   . Probiotic Product (RESTORA PO) Take 1 tablet by mouth daily.  Marland Kitchen Propylene Glycol-Glycerin (SOOTHE OP) Place 1 drop into both eyes 2 (two) times daily.  Marland Kitchen triamcinolone cream (KENALOG) 0.1 % Apply 1 application topically  2 (two) times daily.  . verapamil (CALAN-SR) 240 MG CR tablet Take 240 mg by mouth every morning.   . [DISCONTINUED] diphenoxylate-atropine (LOMOTIL) 2.5-0.025 MG tablet Take 1 tablet every morning and up to 4 tablets daily as needed.  . valACYclovir (VALTREX) 500 MG tablet Take 500 mg by mouth as needed. Reported on 10/31/2015  . [DISCONTINUED] FLUoxetine (PROZAC) 10 MG capsule Take 10 mg by mouth daily.  . [DISCONTINUED] PREVNAR 13 SUSP injection   . [DISCONTINUED] sucralfate (CARAFATE) 1 GM/10ML suspension Take 10 ml's ,3 times daily between meals.   No facility-administered encounter medications on file as of 10/31/2015.   Allergies  Allergen Reactions  . Other     Metal alloy-breaks her out-esp. Metal surgical instruments or some metal implanted in me  . Asa [Aspirin]     Kidney doctor told her to not take ASA  . Erythromycin     Doesn't remember   . Famciclovir     migrane  . Hydrocodone     "makes me lose my mind"-hallucinations  . Ibuprofen     Kidney doctor told her to not take ibuprofen.  . Lactase Diarrhea  . Lactose Intolerance (Gi) Diarrhea and Other (See Comments)    Severe diarrhea  . Morphine And Related Other (See Comments)    Pt says Morphine makes her go out of her head  . Oxycodone     hallucinations  . Promethazine Nausea And Vomiting  . Tylenol [Acetaminophen]     Knocks her out.   Patient Active Problem List   Diagnosis Date Noted  . Carcinoid tumor of intestine ([place]) 03/31/2013  . Nonspecific (abnormal) findings on radiological and other examination of gastrointestinal tract 02/12/2013  . Chronic diarrhea 01/28/2013  . GERD (gastroesophageal reflux disease) 01/28/2013  . Esophageal dysphagia 01/28/2013   Social History   Social History  . Marital Status: Married    Spouse Name: N/A  . Number of Children: N/A  . Years of Education: N/A   Occupational History  . Not on file.   Social History Main Topics  . Smoking status: Former Smoker     Types: Cigarettes  . Smokeless tobacco: Former Systems developer    Quit date: 02/11/1970  . Alcohol Use: No  . Drug Use: No  . Sexual Activity: Not on file   Other Topics Concern  . 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 Kisling's family history includes Cancer in her father and mother. There is no history of Colon cancer.      Objective:    Filed Vitals:   10/31/15 1035  BP: 110/60  Pulse: 68    Physical Exam  well-developed older African-American female in no acute distress, accompanied by  her daughter, quite pleasant blood pressure 110/60 pulse 68 height 5 foot 2 weight 157 down 2 pounds BMI 28.7. HEENT; nontraumatic normocephalic EOMI PERRLA sclera anicteric, Cardiovascular ;regular rate and rhythm with S1-S2 no murmur or gallop, Pulmonary ;clear bilaterally, Abdomen; soft nondistended bowel sounds are active there is no palpable mass or hepatosplenomegaly , no focal tenderness, Rectal ;exam not done, Ext; no clubbing cyanosis or edema skin warm and dry, Neuropsych mood and affect appropriate     Assessment & Plan:   #63 70 year old African-American female with known well-differentiated duodenal carcinoid stage III (patient had 1 positive lymph node with initial resection in 2014) with chronic diarrhea of multiple month duration. Thus far no response to Lomotil. Recent testing to rule out carcinoid syndrome with normal 24-hour urine for 5-HIAA and elevated Chromagen A  Thus far results not consistent with carcinoid syndrome, etiology of her chronic diarrhea is not clear   #2 adult-onset diabetes mellitus #3 GERD  Plan;; increase Lomotil to 2 tablets by mouth 4 times a day Increase Creon  6000 to 2 tablets by mouth one half hour before meals and 1 tablet before meals snacks Schedule for CT of the abdomen and pelvis with contrast If CT is unrevealing consider  colonoscopy with random biopsies to rule out microscopic colitis If that is negative would favor trial of octreotide for secretory diarrhea  Tiffany Ortiz Genia Harold PA-C 10/31/2015   Cc: Celene Squibb, MD

## 2015-11-01 ENCOUNTER — Other Ambulatory Visit: Payer: Self-pay

## 2015-11-01 MED ORDER — PANCRELIPASE (LIP-PROT-AMYL) 6000-19000 UNITS PO CPEP: ORAL_CAPSULE | ORAL | Status: DC

## 2015-11-02 ENCOUNTER — Ambulatory Visit (INDEPENDENT_AMBULATORY_CARE_PROVIDER_SITE_OTHER)
Admission: RE | Admit: 2015-11-02 | Discharge: 2015-11-02 | Disposition: A | Discharge status: Home / Self Care | Payer: Medicare Other | Source: Ambulatory Visit | Attending: Physician Assistant | Admitting: Physician Assistant

## 2015-11-02 DIAGNOSIS — K5790 Diverticulosis of intestine, part unspecified, without perforation or abscess without bleeding: Secondary | ICD-10-CM | POA: Diagnosis not present

## 2015-11-02 DIAGNOSIS — K529 Noninfective gastroenteritis and colitis, unspecified: Secondary | ICD-10-CM | POA: Diagnosis not present

## 2015-11-02 DIAGNOSIS — D3A00 Benign carcinoid tumor of unspecified site: Secondary | ICD-10-CM

## 2015-11-02 MED ORDER — IOPAMIDOL (ISOVUE-300) INJECTION 61%
100.0000 mL | Freq: Once | INTRAVENOUS | Status: AC | PRN
  Administered 2015-11-02: 100 mL via INTRAVENOUS

## 2015-11-02 NOTE — Progress Notes (Signed)
i agree with the above note, plan 

## 2015-11-02 NOTE — Telephone Encounter (Signed)
She should also take imodium 2 pills 3-4 times per day.  If that is not helpful she should add cholestyramine 1 dose twice daily.  Thanks

## 2015-11-03 ENCOUNTER — Telehealth: Payer: Self-pay | Admitting: Physician Assistant

## 2015-11-04 NOTE — Telephone Encounter (Signed)
Reviewed the results of the CT report again with the patient.

## 2015-11-07 ENCOUNTER — Telehealth: Payer: Self-pay | Admitting: Gastroenterology

## 2015-11-07 ENCOUNTER — Telehealth: Payer: Self-pay | Admitting: Physician Assistant

## 2015-11-07 NOTE — Telephone Encounter (Signed)
A user error has taken place: Error °

## 2015-11-21 ENCOUNTER — Other Ambulatory Visit: Payer: Self-pay | Admitting: Physician Assistant

## 2015-11-29 ENCOUNTER — Encounter: Payer: Self-pay | Admitting: Gastroenterology

## 2015-11-29 ENCOUNTER — Ambulatory Visit (INDEPENDENT_AMBULATORY_CARE_PROVIDER_SITE_OTHER): Payer: Medicare Other | Admitting: Gastroenterology

## 2015-11-29 VITALS — BP 130/72 | HR 76 | Ht 62.0 in | Wt 158.0 lb

## 2015-11-29 DIAGNOSIS — C7A00 Malignant carcinoid tumor of unspecified site: Secondary | ICD-10-CM | POA: Diagnosis not present

## 2015-11-29 MED ORDER — PANCRELIPASE (LIP-PROT-AMYL) 12000-38000 UNITS PO CPEP: ORAL_CAPSULE | ORAL | 6 refills | Status: DC

## 2015-11-29 NOTE — Patient Instructions (Addendum)
Continue taking creon 2 pills with every meal and 1 pill with every snack.  We will increase the dose to 12,000 unit pills with same schedule. You will be set up for an upper endoscopy to took again at the duodenum, check primary site of carcinoid. ([place]). Follow up with Dr Ardis Hughs on 02/29/16 8:45 am.

## 2015-11-29 NOTE — Progress Notes (Signed)
Review of pertinent gastrointestinal problems: 1. Chronic diarrhea: Colonoscopy Dr. Gala Romney 02/2013: pan diverticulosis, normal TI, biopsies randomly from colon were normal, a few small HPs were removed. 01/2013 celiac panel, TSH were both normal. 2. 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 Gala Romney), clinical stage III (uT2, pN1), status post an endoscopic biopsy 02/04/2013 confirming a duodenal carcinoid tumor, status post a duodenal resection 03/31/2013 Dr. Barry Dienes with no remaining tumor found in the duodenum and a positive portal lymph node;  Repeat upper endoscopy/EUS on 08/20/2013 confirming a persistent duodenal nodule with a biopsy confirming a low-grade neuroendocrine carcinoma. EGD 2016 Dr. Ardis Hughs, same; the previously noted duodenal nodule was still present in the duodenum, this is carcinoid and it was probably never removed during her duodenal resection surgery. 3. EUS 10/2014above also noted changes consistent with chronic pancreatitis  HPI: This is a very pleasant 70 year old woman whom I last saw about a year ago the time of an upper endoscopy. That confirmed that she still has carcinoid in her duodenum. We knew that to be true from the previous EGD as well.  Chief complaint is persistent diarrhea  She has been bothered by chronic diarrhea for months, years. She was here in our office last month and saw Amy. Repeat CT scan showed no clear remaining carcinoid disease however it would not pick up the duodenal lesion described above. Lab tests and stool testing were negative except for slightly elevated chromogranin A and also slightly low pancreatic elastase.    Diarrhea has been pretty bad.  It has improved over the past 1-2 weeks.    She has been taking pancreatic enzyme supplements; was taking it incorrectly for several weeks but AE reeducated on her about how to take it.  So lately she's been taking 2 pills with every meal and 1  pill with every snack.    Past Medical History:  Diagnosis Date  . Anxiety   . Depression   . Diabetes mellitus   . Fibromyalgia   . GERD (gastroesophageal reflux disease)   . Headache(784.0)   . Hyperlipidemia   . Hyperplastic colon polyp 02/04/2013  . Hypertension   . IBS (irritable bowel syndrome)   . Polyp of rectum 02/04/2013  . Renal disorder   . Shortness of breath   . Sleep apnea   . Vitamin D deficiency     Past Surgical History:  Procedure Laterality Date  . ABDOMINAL HYSTERECTOMY     Cervical cancer, with incidental appendectomy  . APPENDECTOMY    . BIOPSY N/A 02/04/2013   Procedure: BIOPSY;  Surgeon: Daneil Dolin, MD;  Location: [facility];  Service: Endoscopy;  Laterality: N/A;  SB BX AND RANDOM COLON BX  . bladder tack    . BREAST LUMPECTOMY     benign  . COLONOSCOPY  09/22/2003   RMR: Diminutive rectal polyps cold biopsy/removed.  Otherwise normal rectal mucosa/Pancolonic diverticula.  The remainder of the colonic mucosa appeared normal  . COLONOSCOPY WITH ESOPHAGOGASTRODUODENOSCOPY (EGD) N/A 02/04/2013   Procedure: COLONOSCOPY WITH ESOPHAGOGASTRODUODENOSCOPY (EGD);  Surgeon: Daneil Dolin, MD;  Location: [facility];  Service: Endoscopy;  Laterality: N/A;  10:15-moved to 9:30am  . ESOPHAGOGASTRODUODENOSCOPY  11/19/2006   [id number] Schatzki's ring with overlying distal esophageal erosions consistent with erosive reflux esophagitis, status post dilation and disruption of ring as described above/ Small hiatal hernia/Otherwise normal stomach, first duodenum and second duodenum  . EUS N/A 02/12/2013   Procedure: UPPER ENDOSCOPIC  ULTRASOUND (EUS) LINEAR;  Surgeon: Milus Banister, MD;  Location: [facility];  Service: Endoscopy;  Laterality: N/A;  . EUS N/A 08/20/2013   Procedure: UPPER ENDOSCOPIC ULTRASOUND (EUS) LINEAR;  Surgeon: Milus Banister, MD;  Location: [facility];  Service: Endoscopy;  Laterality: N/A;  . LAPAROSCOPIC SMALL BOWEL RESECTION N/A  03/31/2013   Procedure: LAPAROSCOPIC  ASSISTED CONVERTED TO OPEN RESECTION DUODENAL MASS/UPPER ENDOSCOPY;  Surgeon: Stark Klein, MD;  Location: [facility] OR[facility];  Service: General;  Laterality: N/A;  . TONSILLECTOMY      Current Outpatient Prescriptions  Medication Sig Dispense Refill  . ALPRAZolam (XANAX) 1 MG tablet Take 0.5-1 mg by mouth at bedtime as needed for anxiety.     Marland Kitchen antiseptic oral rinse (BIOTENE) LIQD 15 mLs by Mouth Rinse route as needed for dry mouth.    . B Complex-C (SUPER B COMPLEX PO) Take 1 tablet by mouth daily.    Marland Kitchen bismuth subsalicylate (BISMATROL) 262 MG/15ML suspension Take by mouth.    . chlorthalidone (HYGROTON) 25 MG tablet Take 25 mg by mouth daily with breakfast.     . Cholecalciferol (VITAMIN D PO) Take 5,000 Units by mouth once a week. Mondays    . diphenoxylate-atropine (LOMOTIL) 2.5-0.025 MG tablet Take 2 tablets  up to 4 tablets daily as needed. 240 tablet 6  . escitalopram (LEXAPRO) 20 MG tablet Take 20 mg by mouth daily.    . fenofibrate 160 MG tablet Take 160 mg by mouth daily.    Marland Kitchen gabapentin (NEURONTIN) 300 MG capsule Take 300 mg by mouth daily.     Marland Kitchen glipiZIDE (GLUCOTROL XL) 5 MG 24 hr tablet Take 5 mg by mouth daily.    . Inulin (FIBER CHOICE FRUITY BITES) 1.5 g CHEW Chew 1 tablet by mouth daily.    Marland Kitchen losartan (COZAAR) 100 MG tablet Take 100 mg by mouth every morning.     Marland Kitchen MELATONIN PO Take by mouth at bedtime.    . Nutritional Supplements (DHEA PO) Take 1 tablet by mouth at bedtime.    . ondansetron (ZOFRAN) 4 MG tablet Take 4 mg by mouth every 8 (eight) hours as needed for nausea or vomiting.    Marland Kitchen oxymetazoline (AFRIN) 0.05 % nasal spray Place 1 spray into both nostrils 2 (two) times daily as needed for congestion.    . Pancrelipase, Lip-Prot-Amyl, 6000 units CPEP Take 2 capsules with each meal. Take 1 capsule with each snack. 720 capsule 3  . potassium chloride (K-DUR) 10 MEQ tablet TAKE ONE TABLET BY MOUTH DAILY. 10 tablet 0  . pravastatin (PRAVACHOL)  20 MG tablet Take 10 mg by mouth daily.     . Probiotic Product (RESTORA PO) Take 1 tablet by mouth daily.    Marland Kitchen Propylene Glycol-Glycerin (SOOTHE OP) Place 1 drop into both eyes 2 (two) times daily.    Marland Kitchen triamcinolone cream (KENALOG) 0.1 % Apply 1 application topically 2 (two) times daily.    . valACYclovir (VALTREX) 500 MG tablet Take 500 mg by mouth as needed. Reported on 10/31/2015    . verapamil (CALAN-SR) 240 MG CR tablet Take 240 mg by mouth every morning.      No current facility-administered medications for this visit.     Allergies as of 11/29/2015 - Review Complete 11/29/2015  Allergen Reaction Noted  . Other  01/28/2013  . Asa [aspirin]  02/06/2013  . Erythromycin  01/28/2013  . Famciclovir  02/11/2013  . Hydrocodone  02/04/2013  . Ibuprofen  02/06/2013  . Lactase  Diarrhea 11/30/2013  . Lactose intolerance (gi) Diarrhea and Other (See Comments) 03/31/2013  . Morphine and related Other (See Comments) 09/27/2014  . Oxycodone  01/28/2013  . Promethazine Nausea And Vomiting 01/30/2011  . Tylenol [acetaminophen]  02/06/2013    Family History  Problem Relation Age of Onset  . Cancer Mother     ?ovarian  . Cancer Father   . Colon cancer Neg Hx     Social History   Social History  . Marital status: Married    Spouse name: N/A  . Number of children: N/A  . Years of education: N/A   Occupational History  . Not on file.   Social History Main Topics  . Smoking status: Former Smoker    Types: Cigarettes  . Smokeless tobacco: Former Systems developer    Quit date: 02/11/1970  . Alcohol use No  . Drug use: No  . Sexual activity: Not on file   Other Topics Concern  . 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 female with Duodenal carcinoid, chronic diarrhea, probable pancreatic insufficiency  We know she has duodenal carcinoid still present. I had planned repeat EGD to check for interval growth and we will schedule that for her as well. It seems that her loose stools have been getting somewhat better. This change has occurred since she started taking her pancreatic enzyme supplements more properly. She is currently on 6000 unit pills. Taking 2 of them with every meal and 1 with every snack. I am going to increase this dose to 10,000 unit pills but with the same dosing schedule. She will take Lomotil on an as-needed basis still. I'm not sure if she has carcinoid syndrome. Indeed her chromogranin A has been chronically elevated but we see no signs of masses or tumors in her liver based on imaging as recent as last month. She will return to see me in 3 months and sooner if needed and we'll make the change to her pancreatic enzyme supplements and proceed with EGD.   Owens Loffler, MD [facility] 11/29/2015, 9:01 AM

## 2015-12-05 ENCOUNTER — Telehealth: Payer: Self-pay | Admitting: Gastroenterology

## 2015-12-05 NOTE — Telephone Encounter (Signed)
The pt states Amy Trellis Paganini put her on K+ and she is asking for how long should she take these.  She is currently on 10 meq daily.  Please advise

## 2015-12-06 NOTE — Telephone Encounter (Signed)
Tiffany Ortiz had written (on BMET result) that she was to be on K supplement twice daily for 5 days.  That was about a month ago.  I guess she never stopped it?  Ask her what she is currently taking, how often and she needs repeat BMET this week and I'll be able to advise on future dosing.  thanks

## 2015-12-06 NOTE — Telephone Encounter (Signed)
Patient states that her pcp office told her that they would need an order faxed to them. Dr. Delphina Cahill.

## 2015-12-06 NOTE — Telephone Encounter (Signed)
Ok, and no more K= tablets .Marland Kitchen It was only meant to be for 5 days when called in a month ago

## 2015-12-06 NOTE — Telephone Encounter (Signed)
Spoke with pt and she states she is not taking any potassium at this time. States her prescription ran out and she stopped taking the medication. Reports she is feeling tired. Discussed with pt that she needed to come and have labs done and we would decide after lab results come back. Pt states she has an appointment with her PCP this week and will have him draw the labs and take care of the potassium. Dr. Ardis Hughs notified.

## 2015-12-06 NOTE — Telephone Encounter (Signed)
Order faxed to PCP at [phone].

## 2015-12-12 DIAGNOSIS — I70293 Other atherosclerosis of native arteries of extremities, bilateral legs: Secondary | ICD-10-CM | POA: Diagnosis not present

## 2015-12-12 DIAGNOSIS — M069 Rheumatoid arthritis, unspecified: Secondary | ICD-10-CM | POA: Diagnosis not present

## 2015-12-12 DIAGNOSIS — L602 Onychogryphosis: Secondary | ICD-10-CM | POA: Diagnosis not present

## 2015-12-12 DIAGNOSIS — E1351 Other specified diabetes mellitus with diabetic peripheral angiopathy without gangrene: Secondary | ICD-10-CM | POA: Diagnosis not present

## 2015-12-12 DIAGNOSIS — L84 Corns and callosities: Secondary | ICD-10-CM | POA: Diagnosis not present

## 2015-12-14 DIAGNOSIS — E1165 Type 2 diabetes mellitus with hyperglycemia: Secondary | ICD-10-CM | POA: Diagnosis not present

## 2015-12-30 DIAGNOSIS — M069 Rheumatoid arthritis, unspecified: Secondary | ICD-10-CM | POA: Diagnosis not present

## 2016-01-03 ENCOUNTER — Other Ambulatory Visit: Payer: Self-pay

## 2016-01-03 DIAGNOSIS — H2513 Age-related nuclear cataract, bilateral: Secondary | ICD-10-CM | POA: Diagnosis not present

## 2016-01-10 DIAGNOSIS — E1165 Type 2 diabetes mellitus with hyperglycemia: Secondary | ICD-10-CM | POA: Diagnosis not present

## 2016-01-12 DIAGNOSIS — H16041 Marginal corneal ulcer, right eye: Secondary | ICD-10-CM | POA: Diagnosis not present

## 2016-01-16 ENCOUNTER — Telehealth: Payer: Self-pay | Admitting: Gastroenterology

## 2016-01-16 NOTE — Telephone Encounter (Addendum)
  Spoke with patient and she stated that her blood sugars have been running very low.  Stated that her PCP put "something in her arm the size of a quarter to make her blood sugar better."  States that she has had diarrhea since April 2 of this year, and that she is very weak due to the blood sugar and the diarrhea. She would like to cancel and re-schedule her EGD. She stated also that she had a history of duodenal cancer.  I asked her if she was sure that she didn't want to have the procedure tomorrow, and she said again that she'd like to re-schedule it.   She was a poor historian. This note forwarded to Dr. Ardis Hughs.  Patient did call back, and she stated that she did not want the surgeon who did her last surgery not to be her surgeon again.  She was quite vocal about that fact.

## 2016-01-17 ENCOUNTER — Encounter: Payer: Medicare Other | Admitting: Gastroenterology

## 2016-01-23 DIAGNOSIS — N183 Chronic kidney disease, stage 3 (moderate): Secondary | ICD-10-CM | POA: Diagnosis not present

## 2016-01-23 DIAGNOSIS — I10 Essential (primary) hypertension: Secondary | ICD-10-CM | POA: Diagnosis not present

## 2016-01-23 DIAGNOSIS — E782 Mixed hyperlipidemia: Secondary | ICD-10-CM | POA: Diagnosis not present

## 2016-01-23 DIAGNOSIS — E119 Type 2 diabetes mellitus without complications: Secondary | ICD-10-CM | POA: Diagnosis not present

## 2016-01-23 DIAGNOSIS — E559 Vitamin D deficiency, unspecified: Secondary | ICD-10-CM | POA: Diagnosis not present

## 2016-01-23 DIAGNOSIS — R197 Diarrhea, unspecified: Secondary | ICD-10-CM | POA: Diagnosis not present

## 2016-01-23 DIAGNOSIS — K219 Gastro-esophageal reflux disease without esophagitis: Secondary | ICD-10-CM | POA: Diagnosis not present

## 2016-01-23 DIAGNOSIS — G589 Mononeuropathy, unspecified: Secondary | ICD-10-CM | POA: Diagnosis not present

## 2016-01-23 DIAGNOSIS — G4733 Obstructive sleep apnea (adult) (pediatric): Secondary | ICD-10-CM | POA: Diagnosis not present

## 2016-01-23 DIAGNOSIS — C7A010 Malignant carcinoid tumor of the duodenum: Secondary | ICD-10-CM | POA: Diagnosis not present

## 2016-01-24 DIAGNOSIS — E1165 Type 2 diabetes mellitus with hyperglycemia: Secondary | ICD-10-CM | POA: Diagnosis not present

## 2016-02-20 DIAGNOSIS — I482 Chronic atrial fibrillation: Secondary | ICD-10-CM | POA: Diagnosis not present

## 2016-02-20 DIAGNOSIS — E785 Hyperlipidemia, unspecified: Secondary | ICD-10-CM | POA: Diagnosis not present

## 2016-02-20 DIAGNOSIS — E119 Type 2 diabetes mellitus without complications: Secondary | ICD-10-CM | POA: Diagnosis not present

## 2016-02-20 DIAGNOSIS — I10 Essential (primary) hypertension: Secondary | ICD-10-CM | POA: Diagnosis not present

## 2016-02-20 DIAGNOSIS — E782 Mixed hyperlipidemia: Secondary | ICD-10-CM | POA: Diagnosis not present

## 2016-02-20 DIAGNOSIS — E039 Hypothyroidism, unspecified: Secondary | ICD-10-CM | POA: Diagnosis not present

## 2016-02-20 DIAGNOSIS — E559 Vitamin D deficiency, unspecified: Secondary | ICD-10-CM | POA: Diagnosis not present

## 2016-02-20 DIAGNOSIS — R7301 Impaired fasting glucose: Secondary | ICD-10-CM | POA: Diagnosis not present

## 2016-02-22 DIAGNOSIS — Z6828 Body mass index (BMI) 28.0-28.9, adult: Secondary | ICD-10-CM | POA: Diagnosis not present

## 2016-02-22 DIAGNOSIS — E119 Type 2 diabetes mellitus without complications: Secondary | ICD-10-CM | POA: Diagnosis not present

## 2016-02-22 DIAGNOSIS — K219 Gastro-esophageal reflux disease without esophagitis: Secondary | ICD-10-CM | POA: Diagnosis not present

## 2016-02-22 DIAGNOSIS — G4733 Obstructive sleep apnea (adult) (pediatric): Secondary | ICD-10-CM | POA: Diagnosis not present

## 2016-02-22 DIAGNOSIS — E782 Mixed hyperlipidemia: Secondary | ICD-10-CM | POA: Diagnosis not present

## 2016-02-22 DIAGNOSIS — E559 Vitamin D deficiency, unspecified: Secondary | ICD-10-CM | POA: Diagnosis not present

## 2016-02-22 DIAGNOSIS — I10 Essential (primary) hypertension: Secondary | ICD-10-CM | POA: Diagnosis not present

## 2016-02-22 DIAGNOSIS — Z0001 Encounter for general adult medical examination with abnormal findings: Secondary | ICD-10-CM | POA: Diagnosis not present

## 2016-02-22 DIAGNOSIS — Z23 Encounter for immunization: Secondary | ICD-10-CM | POA: Diagnosis not present

## 2016-02-22 DIAGNOSIS — G589 Mononeuropathy, unspecified: Secondary | ICD-10-CM | POA: Diagnosis not present

## 2016-02-29 ENCOUNTER — Encounter (INDEPENDENT_AMBULATORY_CARE_PROVIDER_SITE_OTHER): Payer: Self-pay

## 2016-02-29 ENCOUNTER — Encounter: Payer: Self-pay | Admitting: Gastroenterology

## 2016-02-29 ENCOUNTER — Ambulatory Visit (INDEPENDENT_AMBULATORY_CARE_PROVIDER_SITE_OTHER): Payer: Medicare Other | Admitting: Gastroenterology

## 2016-02-29 VITALS — BP 118/68 | HR 58 | Ht 61.5 in | Wt 164.0 lb

## 2016-02-29 DIAGNOSIS — D3A00 Benign carcinoid tumor of unspecified site: Secondary | ICD-10-CM

## 2016-02-29 DIAGNOSIS — K529 Noninfective gastroenteritis and colitis, unspecified: Secondary | ICD-10-CM | POA: Diagnosis not present

## 2016-02-29 NOTE — Progress Notes (Signed)
Review of pertinent gastrointestinal problems: 1. Chronic diarrhea:Colonoscopy Dr. Gala Romney 02/2013: pan diverticulosis, normal TI, biopsies randomly from colon were normal, a few small HPs were removed. 01/2013 celiac panel, TSH were both normal. 2. 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 Gala Romney), clinical stage III (uT2, pN1), status post an endoscopic biopsy 02/04/2013 confirming a duodenal carcinoid tumor, status post a duodenal resection 03/31/2013 Dr. Barry Dienes with no remaining tumor found in the duodenum and a positive portal lymph node;  Repeat upper endoscopy/EUS on 08/20/2013 confirming a persistent duodenal nodule with a biopsy confirming a low-grade neuroendocrine carcinoma. EGD 2016 Dr. Ardis Hughs, same; the previously noted duodenal nodule was still present in the duodenum, this is carcinoid and it was probably never removed during her duodenal resection surgery. 3. EUS 10/2014above also noted changes consistent withchronic pancreatitis   HPI: This is a  pleasant 69 year old woman whom I last saw 3 months ago    Chief complaint is duodenal carcinoid,  I last saw her 3 months ago and we made some changes to her medicines;  She is taking creon 12000, 2 pills with every meal and 1 pill with snacks. Lomotil PRN; about once per week.  Her bowels are "a lot better";  Her diarrhea is "much much better."   ROS: complete GI ROS as described in HPI.  Constitutional:  No unintentional weight loss   Past Medical History:  Diagnosis Date  . Anxiety   . Depression   . Diabetes mellitus   . Fibromyalgia   . GERD (gastroesophageal reflux disease)   . Headache(784.0)   . Hyperlipidemia   . Hyperplastic colon polyp 02/04/2013  . Hypertension   . IBS (irritable bowel syndrome)   . Polyp of rectum 02/04/2013  . Renal disorder   . Shortness of breath   . Sleep apnea   . Vitamin D deficiency     Past Surgical History:  Procedure  Laterality Date  . ABDOMINAL HYSTERECTOMY     Cervical cancer, with incidental appendectomy  . APPENDECTOMY    . BIOPSY N/A 02/04/2013   Procedure: BIOPSY;  Surgeon: Daneil Dolin, MD;  Location: [facility];  Service: Endoscopy;  Laterality: N/A;  SB BX AND RANDOM COLON BX  . bladder tack    . BREAST LUMPECTOMY     benign  . COLONOSCOPY  09/22/2003   RMR: Diminutive rectal polyps cold biopsy/removed.  Otherwise normal rectal mucosa/Pancolonic diverticula.  The remainder of the colonic mucosa appeared normal  . COLONOSCOPY WITH ESOPHAGOGASTRODUODENOSCOPY (EGD) N/A 02/04/2013   Procedure: COLONOSCOPY WITH ESOPHAGOGASTRODUODENOSCOPY (EGD);  Surgeon: Daneil Dolin, MD;  Location: [facility];  Service: Endoscopy;  Laterality: N/A;  10:15-moved to 9:30am  . ESOPHAGOGASTRODUODENOSCOPY  11/19/2006   [id number] Schatzki's ring with overlying distal esophageal erosions consistent with erosive reflux esophagitis, status post dilation and disruption of ring as described above/ Small hiatal hernia/Otherwise normal stomach, first duodenum and second duodenum  . EUS N/A 02/12/2013   Procedure: UPPER ENDOSCOPIC ULTRASOUND (EUS) LINEAR;  Surgeon: Milus Banister, MD;  Location: [facility];  Service: Endoscopy;  Laterality: N/A;  . EUS N/A 08/20/2013   Procedure: UPPER ENDOSCOPIC ULTRASOUND (EUS) LINEAR;  Surgeon: Milus Banister, MD;  Location: [facility];  Service: Endoscopy;  Laterality: N/A;  . LAPAROSCOPIC SMALL BOWEL RESECTION N/A 03/31/2013   Procedure: LAPAROSCOPIC  ASSISTED CONVERTED TO OPEN RESECTION DUODENAL MASS/UPPER ENDOSCOPY;  Surgeon: Stark Klein, MD;  Location: [facility];  Service: General;  Laterality: N/A;  . TONSILLECTOMY      Current Outpatient Prescriptions  Medication Sig Dispense Refill  . ALPRAZolam (XANAX) 1 MG tablet Take 0.5-1 mg by mouth at bedtime as needed for anxiety.     Marland Kitchen antiseptic oral rinse (BIOTENE) LIQD 15 mLs by Mouth Rinse route as needed for dry mouth.     . bismuth subsalicylate (BISMATROL) 262 MG/15ML suspension Take by mouth.    . Calcium Carbonate-Simethicone (ALKA-SELTZER HEARTBURN + GAS) 750-80 MG CHEW Chew by mouth as needed.    . chlorthalidone (HYGROTON) 25 MG tablet Take 25 mg by mouth daily with breakfast.     . Cholecalciferol (VITAMIN D PO) Take 5,000 Units by mouth once a week. Mondays    . Cyanocobalamin (VITAMIN B-12 PO) Take 1 tablet by mouth daily.    Marland Kitchen dexlansoprazole (DEXILANT) 60 MG capsule Take 60 mg by mouth.    . diphenoxylate-atropine (LOMOTIL) 2.5-0.025 MG tablet Take 2 tablets  up to 4 tablets daily as needed. 240 tablet 6  . escitalopram (LEXAPRO) 20 MG tablet Take 20 mg by mouth daily.    Marland Kitchen gabapentin (NEURONTIN) 300 MG capsule Take 300 mg by mouth daily.     Marland Kitchen glipiZIDE (GLUCOTROL) 2.5 mg TABS tablet Take 2.5 mg by mouth daily before breakfast.    . Inulin (FIBER CHOICE FRUITY BITES) 1.5 g CHEW Chew 1 tablet by mouth daily.    . lipase/protease/amylase (CREON) 12000 units CPEP capsule 2 pills with every meal by mouth and 1 pill by mouth with every snack 270 capsule 6  . losartan (COZAAR) 100 MG tablet Take 100 mg by mouth every morning.     . Nutritional Supplements (DHEA PO) Take 1 tablet by mouth at bedtime.    Marland Kitchen oxymetazoline (AFRIN) 0.05 % nasal spray Place 1 spray into both nostrils 2 (two) times daily as needed for congestion.    . potassium chloride (K-DUR) 10 MEQ tablet TAKE ONE TABLET BY MOUTH DAILY. 10 tablet 0  . pravastatin (PRAVACHOL) 10 MG tablet Take 10 mg by mouth daily.    . Probiotic Product (RESTORA PO) Take 1 tablet by mouth daily.    Marland Kitchen Propylene Glycol-Glycerin (SOOTHE OP) Place 1 drop into both eyes 2 (two) times daily.    Marland Kitchen triamcinolone cream (KENALOG) 0.1 % Apply 1 application topically 2 (two) times daily.    . valACYclovir (VALTREX) 500 MG tablet Take 500 mg by mouth as needed. Reported on 10/31/2015    . verapamil (CALAN-SR) 240 MG CR tablet Take 240 mg by mouth every morning.      No  current facility-administered medications for this visit.     Allergies as of 02/29/2016 - Review Complete 02/29/2016  Allergen Reaction Noted  . Other  01/28/2013  . Asa [aspirin]  02/06/2013  . Erythromycin  01/28/2013  . Famciclovir  02/11/2013  . Hydrocodone  02/04/2013  . Ibuprofen  02/06/2013  . Lactase Diarrhea 11/30/2013  . Lactose intolerance (gi) Diarrhea and Other (See Comments) 03/31/2013  . Morphine and related Other (See Comments) 09/27/2014  . Oxycodone  01/28/2013  . Promethazine Nausea And Vomiting 01/30/2011  . Tylenol [acetaminophen]  02/06/2013    Family History  Problem Relation Age of Onset  . Cancer Mother     ?ovarian  . Cancer Father   . Colon cancer Neg Hx     Social History   Social History  . Marital status: Married    Spouse name: N/A  . Number of children: N/A  .  Years of education: N/A   Occupational History  . Not on file.   Social History Main Topics  . Smoking status: Former Smoker    Types: Cigarettes  . Smokeless tobacco: Former Systems developer    Quit date: 02/11/1970  . Alcohol use No  . Drug use: No  . Sexual activity: Not on file   Other Topics Concern  . 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 female with Known duodenal carcinoid, positive local adenopathy  First her history giving is quite scattered and I wonder about underlying mild dementia. Her husband was with her and after some time of questioning it was clear that since she made the changes basically of increasing her Creon to double the  dose her bowels are much much better. I recommended she continue that higher dose 2 pills with every meal, one pill with every snack. She asked about the duodenal carcinoid which we know is still in place based on endoscopies. I recommended we check again with upper endoscopy to see if it has shown significant growth.   Owens Loffler, MD [facility] 02/29/2016, 9:26 AM

## 2016-02-29 NOTE — Patient Instructions (Addendum)
Stay on creon 12K, take 2 with every meal and 1 with every snack. Take lomotil PRN. Repeat EGD to for surveillance of the known duodenal carcinoid.

## 2016-03-06 ENCOUNTER — Other Ambulatory Visit: Payer: Self-pay | Admitting: Oncology

## 2016-03-07 ENCOUNTER — Telehealth: Payer: Self-pay | Admitting: *Deleted

## 2016-03-07 NOTE — Telephone Encounter (Signed)
"  I received a call about an appointment and need to make sure it's the correct information."  Provided appointment information with this call.

## 2016-03-08 ENCOUNTER — Ambulatory Visit (HOSPITAL_BASED_OUTPATIENT_CLINIC_OR_DEPARTMENT_OTHER): Payer: Medicare Other | Admitting: Oncology

## 2016-03-08 ENCOUNTER — Other Ambulatory Visit (HOSPITAL_BASED_OUTPATIENT_CLINIC_OR_DEPARTMENT_OTHER): Payer: Medicare Other

## 2016-03-08 ENCOUNTER — Telehealth: Payer: Self-pay | Admitting: Oncology

## 2016-03-08 ENCOUNTER — Telehealth: Payer: Self-pay | Admitting: *Deleted

## 2016-03-08 VITALS — BP 130/56 | HR 58 | Temp 98.5°F | Resp 17 | Ht 61.5 in | Wt 161.8 lb

## 2016-03-08 DIAGNOSIS — D3A098 Benign carcinoid tumors of other sites: Secondary | ICD-10-CM | POA: Diagnosis not present

## 2016-03-08 DIAGNOSIS — I10 Essential (primary) hypertension: Secondary | ICD-10-CM

## 2016-03-08 DIAGNOSIS — C7A010 Malignant carcinoid tumor of the duodenum: Secondary | ICD-10-CM

## 2016-03-08 DIAGNOSIS — E119 Type 2 diabetes mellitus without complications: Secondary | ICD-10-CM

## 2016-03-08 NOTE — Telephone Encounter (Signed)
L/M on patient's v/m regarding follow up appointment scheduled for August, 2018, per 03/08/16 los. An appointment letter and schedule was also mailed. 03/08/16

## 2016-03-08 NOTE — Telephone Encounter (Signed)
"  I come in today for lab at 11:00.  Am I supposed to fast for today's appointment, I'm hungry." No test preparation or fasting required for Chromgranin A test. No further questions.

## 2016-03-08 NOTE — Progress Notes (Signed)
  [facility] OFFICE PROGRESS NOTE   Diagnosis: Carcinoid tumor  INTERVAL HISTORY:   Tiffany Ortiz returns as scheduled. She reports improvement in diarrhea and feels better in general. She continues to have hot flashes. She has intermittent nausea and abdominal pain. She is followed by Dr. Ardis Hughs. She is scheduled for an upper endoscopy next month. She took a "a few "doses of the dexilant this week.  Objective:  Vital signs in last 24 hours:  Blood pressure (!) 130/56, pulse (!) 58, temperature 98.5 F (36.9 C), temperature source Oral, resp. rate 17, height 5' 1.5" (1.562 m), weight 161 lb 12.8 oz (73.4 kg), SpO2 99 %.    HEENT: Neck without mass Lymphatics: No cervical, supra-clavicular, axillary, or inguinal nodes Resp: Lungs clear bilaterally Cardio: Regular rate and rhythm GI: No hepatomegaly, no mass, mild tenderness in the upper abdomen Vascular: No leg edema  Medications: I have reviewed the patient's current medications.  Assessment/Plan: . 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, clinical stage III (uT2, pN1), status post an endoscopic biopsy 02/04/2013 confirming a duodenal carcinoid tumor, status post a duodenal resection 03/31/2013 with no remaining tumor found in the duodenum and a positive portal lymph node   Repeat upper endoscopy/EUS on 08/20/2013 confirming a persistent duodenal nodule with a biopsy confirming a low-grade neuroendocrine carcinoma  Repeat upper endoscopy 09/27/2014 with a duodenal nodule proximal to a surgical scar, biopsy confirmed a well-differentiated neuroendocrine tumor 2. Diarrhea-intermittent-I doubt this is related to carcinoid disease  3. Diabetes  4. Sleep apnea  5. Fibromyalgia  6. Hypertension  7. Gastroesophageal reflux disease  8. Status post dilatation of a Schatzki's ring 02/04/2013  9. Vitiligo and hyperpigmented skin rash-evaluated by  dermatology    Disposition:  Tiffany Ortiz appears stable. We will follow-up on the chromogranin A level from today. She will return for an office visit in 9 months. She will continue follow-up with gastroenterology including a repeat endoscopy next month.  Betsy Coder, MD  03/08/2016  1:33 PM

## 2016-03-09 ENCOUNTER — Telehealth: Payer: Self-pay | Admitting: *Deleted

## 2016-03-09 LAB — CHROMOGRANIN A: Chromogranin A: 6 nmol/L — ABNORMAL HIGH (ref 0–5)

## 2016-03-09 NOTE — Telephone Encounter (Signed)
-----   Message from Ladell Pier, MD sent at 03/09/2016  5:27 PM EDT ----- Please call patient , chromogranin is normal

## 2016-03-09 NOTE — Telephone Encounter (Signed)
Left message on voicemail for pt to call office for lab results. Normal, per Dr. Benay Spice.

## 2016-03-14 ENCOUNTER — Telehealth: Payer: Self-pay | Admitting: *Deleted

## 2016-03-14 DIAGNOSIS — E559 Vitamin D deficiency, unspecified: Secondary | ICD-10-CM | POA: Diagnosis not present

## 2016-03-14 DIAGNOSIS — Z79899 Other long term (current) drug therapy: Secondary | ICD-10-CM | POA: Diagnosis not present

## 2016-03-14 DIAGNOSIS — I10 Essential (primary) hypertension: Secondary | ICD-10-CM | POA: Diagnosis not present

## 2016-03-14 DIAGNOSIS — N183 Chronic kidney disease, stage 3 (moderate): Secondary | ICD-10-CM | POA: Diagnosis not present

## 2016-03-14 DIAGNOSIS — D509 Iron deficiency anemia, unspecified: Secondary | ICD-10-CM | POA: Diagnosis not present

## 2016-03-14 DIAGNOSIS — R809 Proteinuria, unspecified: Secondary | ICD-10-CM | POA: Diagnosis not present

## 2016-03-14 NOTE — Telephone Encounter (Signed)
Message received from patient requesting lab results from last week.  Call placed back to patient and patient notified per Dr. Benay Spice that her lab results were normal.  Patient appreciative of call back and would like to reschedule appt on 12/06/16.  Message sent to scheduling.

## 2016-03-15 DIAGNOSIS — L84 Corns and callosities: Secondary | ICD-10-CM | POA: Diagnosis not present

## 2016-03-15 DIAGNOSIS — L602 Onychogryphosis: Secondary | ICD-10-CM | POA: Diagnosis not present

## 2016-03-15 DIAGNOSIS — E1151 Type 2 diabetes mellitus with diabetic peripheral angiopathy without gangrene: Secondary | ICD-10-CM | POA: Diagnosis not present

## 2016-03-21 DIAGNOSIS — N182 Chronic kidney disease, stage 2 (mild): Secondary | ICD-10-CM | POA: Diagnosis not present

## 2016-03-21 DIAGNOSIS — E559 Vitamin D deficiency, unspecified: Secondary | ICD-10-CM | POA: Diagnosis not present

## 2016-03-22 DIAGNOSIS — E1165 Type 2 diabetes mellitus with hyperglycemia: Secondary | ICD-10-CM | POA: Diagnosis not present

## 2016-04-06 ENCOUNTER — Telehealth: Payer: Self-pay | Admitting: Gastroenterology

## 2016-04-06 MED ORDER — RANITIDINE HCL 300 MG PO TABS: 300.0000 mg | ORAL_TABLET | Freq: Two times a day (BID) | ORAL | 3 refills | Status: DC

## 2016-04-06 NOTE — Telephone Encounter (Signed)
Patient reports that she is having terrible reflux.  She was instructed to come off of her dexilant.  She is not sure who told her that, but it was due to memory issues. She was instructed to take zantac 150 mg, but not helping at all .  Can we increase her to zantac 300 BID?

## 2016-04-06 NOTE — Telephone Encounter (Signed)
Patient notified

## 2016-04-06 NOTE — Telephone Encounter (Signed)
Yes, thanks

## 2016-04-13 ENCOUNTER — Telehealth: Payer: Self-pay | Admitting: Gastroenterology

## 2016-04-13 NOTE — Telephone Encounter (Signed)
Pt was instructed to try prilosec otc daily and zantac 300 mg bid and keep EGD appt as scheduled.  Pt agreed and will keep appt.

## 2016-04-20 ENCOUNTER — Ambulatory Visit (AMBULATORY_SURGERY_CENTER): Payer: Medicare Other | Admitting: Gastroenterology

## 2016-04-20 ENCOUNTER — Encounter: Payer: Self-pay | Admitting: Gastroenterology

## 2016-04-20 VITALS — BP 149/78 | HR 62 | Temp 98.6°F | Resp 16 | Ht 62.0 in | Wt 157.0 lb

## 2016-04-20 DIAGNOSIS — D3A098 Benign carcinoid tumors of other sites: Secondary | ICD-10-CM

## 2016-04-20 DIAGNOSIS — R12 Heartburn: Secondary | ICD-10-CM | POA: Diagnosis not present

## 2016-04-20 DIAGNOSIS — R1310 Dysphagia, unspecified: Secondary | ICD-10-CM | POA: Diagnosis not present

## 2016-04-20 DIAGNOSIS — D3A010 Benign carcinoid tumor of the duodenum: Secondary | ICD-10-CM | POA: Diagnosis not present

## 2016-04-20 DIAGNOSIS — K222 Esophageal obstruction: Secondary | ICD-10-CM

## 2016-04-20 DIAGNOSIS — C170 Malignant neoplasm of duodenum: Secondary | ICD-10-CM | POA: Diagnosis not present

## 2016-04-20 MED ORDER — OMEPRAZOLE 40 MG PO CPDR: 40.0000 mg | DELAYED_RELEASE_CAPSULE | Freq: Every day | ORAL | 11 refills | Status: DC

## 2016-04-20 MED ORDER — SODIUM CHLORIDE 0.9 % IV SOLN: 500.0000 mL | INTRAVENOUS | Status: DC

## 2016-04-20 NOTE — Progress Notes (Signed)
Called to room to assist during endoscopic procedure.  Patient ID and intended procedure confirmed with present staff. Received instructions for my participation in the procedure from the performing physician.  

## 2016-04-20 NOTE — Progress Notes (Signed)
A/ox3 pleased with MAC, report to Penny RN 

## 2016-04-20 NOTE — Op Note (Signed)
[facility] Patient Name: Tiffany Ortiz Procedure Date: 04/20/2016 9:57 AM MRN: [id number] Endoscopist: Milus Banister , MD Age: 70 Referring MD:  Date of Birth: 17-Nov-1945 Gender: Female Account #: [id number] Procedure:                Upper GI endoscopy Indications:              Dysphagia (she told me this has recurred prior to                            EGD), Heartburn; h/o duodenal carcinoid Duodenal                            carcinoid, spread to local LN: (carcinoid) of the                            proximal duodenum (originally noted by Dr. Gala Romney),                            clinical stage III (uT2, pN1), status post an                            endoscopic biopsy 02/04/2013 confirming a duodenal                            carcinoid tumor, status post a duodenal resection                            03/31/2013 with no remaining tumor found in the                            duodenum and a positive portal lymph node; Repeat                            upper endoscopy/EUS on 08/20/2013 confirming a                            persistent duodenal nodule with a biopsy confirming                            a low-grade neuroendocrine carcinoma. EGD 2016 Dr.                            Ardis Hughs, same; the previously noted duodenal nodule                            was still present in the duodenum, this is carcinoid Medicines:                Monitored Anesthesia Care Procedure:                Pre-Anesthesia Assessment:                           - Prior to the procedure, a History and Physical  was performed, and patient medications and                            allergies were reviewed. [name] tolerance of                            previous anesthesia was also reviewed. The risks                            and benefits of the procedure and the sedation                            options and risks were discussed with the patient.                   All questions were answered, and informed consent                            was obtained. Prior Anticoagulants: The patient has                            taken no previous anticoagulant or antiplatelet                            agents. ASA Grade Assessment: II - A patient with                            mild systemic disease. After reviewing the risks                            and benefits, the patient was deemed in                            satisfactory condition to undergo the procedure.                           After obtaining informed consent, the endoscope was                            passed under direct vision. Throughout the                            procedure, the patient's blood pressure, pulse, and                            oxygen saturations were monitored continuously. The                            Model GIF-[id number]) scope was introduced                            through the mouth, and advanced to the second part  of duodenum. The upper GI endoscopy was                            accomplished without difficulty. The patient                            tolerated the procedure well. Scope In: Scope Out: Findings:                 One mild benign-appearing, intrinsic stenosis was                            found at the gastroesophageal junction (focal                            peptic stricture vs. thick Schatzki's type ring). A                            TTS dilator was passed through the scope. Dilation                            with an 18-19-20 mm balloon dilator was performed                            to 19 mm. There was the typical post dilation minor                            self limited oozing of blood.                           A single 12 mm nodule was found in the duodenal                            bulb. This was unchanged from previous upper                            endoscopies (see above). Biopsies  were taken with a                            cold forceps for histology.                           A small hiatal hernia was present.                           The exam was otherwise without abnormality. Complications:            No immediate complications. Estimated blood loss:                            None. Estimated Blood Loss:     Estimated blood loss: none. Impression:               - Benign-appearing esophageal stenosis (peptic  stricture vs. Schatzki's ring). Dilated.                           - The previously known duodenal bulb carcinoid was                            unchanged appearing. Biopsied.                           - Small hiatal hernia.                           - The examination was otherwise normal. Recommendation:           - Patient has a contact number available for                            emergencies. The signs and symptoms of potential                            delayed complications were discussed with the                            patient. Return to normal activities tomorrow.                            Written discharge instructions were provided to the                            patient.                           - Resume previous diet.                           - Continue present medications.                           - Await pathology results. Milus Banister, MD 04/20/2016 10:21:49 AM This report has been signed electronically.

## 2016-04-20 NOTE — Patient Instructions (Signed)
YOU HAD AN ENDOSCOPIC PROCEDURE TODAY AT [facility]:   Refer to the procedure report that was given to you for any specific questions about what was found during the examination.  If the procedure report does not answer your questions, please call your gastroenterologist to clarify.  If you requested that your care partner not be given the details of your procedure findings, then the procedure report has been included in a sealed envelope for you to review at your convenience later.  YOU SHOULD EXPECT: Some feelings of bloating in the abdomen. Passage of more gas than usual.  Walking can help get rid of the air that was put into your GI tract during the procedure and reduce the bloating. If you had a lower endoscopy (such as a colonoscopy or flexible sigmoidoscopy) you may notice spotting of blood in your stool or on the toilet paper. If you underwent a bowel prep for your procedure, you may not have a normal bowel movement for a few days.  Please Note:  You might notice some irritation and congestion in your nose or some drainage.  This is from the oxygen used during your procedure.  There is no need for concern and it should clear up in a day or so.  SYMPTOMS TO REPORT IMMEDIATELY:     Following upper endoscopy (EGD)  Vomiting of blood or coffee ground material  New chest pain or pain under the shoulder blades  Painful or persistently difficult swallowing  New shortness of breath  Fever of 100F or higher  Black, tarry-looking stools  For urgent or emergent issues, a gastroenterologist can be reached at any hour by calling [phone].   DIET:  FOLLOW DILATATION DIET GIVEN TO YOU TODAY -NOTHING BY MOUTH UNTIL 11;30 AM TODAY,THEN CLEAR LIQUIDS ONLY UNTIL 12;30 PM TODAY ,THEN SOFT FOODS THE REST OF TODAY,RESUME REGULAR DIET TOMORROW AS TOLERATED  ACTIVITY:  You should plan to take it easy for the rest of today and you should NOT DRIVE or use heavy machinery until tomorrow  (because of the sedation medicines used during the test).    FOLLOW UP: Our staff will call the number listed on your records the next business day following your procedure to check on you and address any questions or concerns that you may have regarding the information given to you following your procedure. If we do not reach you, we will leave a message.  However, if you are feeling well and you are not experiencing any problems, there is no need to return our call.  We will assume that you have returned to your regular daily activities without incident.  If any biopsies were taken you will be contacted by phone or by letter within the next 1-3 weeks.  Please call us at [phone] if you have not heard about the biopsies in 3 weeks.    SIGNATURES/CONFIDENTIALITY: You and/or your care partner have signed paperwork which will be entered into your electronic medical record.  These signatures attest to the fact that that the information above on your After Visit Summary has been reviewed and is understood.  Full responsibility of the confidentiality of this discharge information lies with you and/or your care-partner.   AWAIT BIOPSY RESULTS

## 2016-04-23 ENCOUNTER — Telehealth: Payer: Self-pay | Admitting: *Deleted

## 2016-04-23 ENCOUNTER — Telehealth: Payer: Self-pay | Admitting: Gastroenterology

## 2016-04-23 NOTE — Telephone Encounter (Signed)
Pt aware that she has a prescription for prilosec and zantac at the pharmacy.  She will call and confirm that the prescriptions are available and call back with any concerns

## 2016-04-23 NOTE — Telephone Encounter (Signed)
  Follow up Call-  Call back number 04/20/2016 09/27/2014  Post procedure Call Back phone  # [phone]  Permission to leave phone message Yes Yes  Some recent data might be hidden     Patient questions:  Do you have a fever, pain , or abdominal swelling? No. Pain Score  0 *  Have you tolerated food without any problems? Yes.    Have you been able to return to your normal activities? Yes.    Do you have any questions about your discharge instructions: Diet   No. Medications  No. Follow up visit  No.  Do you have questions or concerns about your Care? No.  Actions: * If pain score is 4 or above: No action needed, pain <4. Pt. Had questions concerning the medication changes that her PCP has made and the way it made her feel she stated that she didn't have any problems with the procedure,encouraged the pt. To contact PCP to inform them of how the medication is making her feel she verbalized understanding.

## 2016-04-24 DIAGNOSIS — E119 Type 2 diabetes mellitus without complications: Secondary | ICD-10-CM | POA: Diagnosis not present

## 2016-04-24 DIAGNOSIS — I10 Essential (primary) hypertension: Secondary | ICD-10-CM | POA: Diagnosis not present

## 2016-06-12 DIAGNOSIS — M19072 Primary osteoarthritis, left ankle and foot: Secondary | ICD-10-CM | POA: Diagnosis not present

## 2016-06-12 DIAGNOSIS — L84 Corns and callosities: Secondary | ICD-10-CM | POA: Diagnosis not present

## 2016-06-12 DIAGNOSIS — M79672 Pain in left foot: Secondary | ICD-10-CM | POA: Diagnosis not present

## 2016-06-12 DIAGNOSIS — L602 Onychogryphosis: Secondary | ICD-10-CM | POA: Diagnosis not present

## 2016-06-12 DIAGNOSIS — E1351 Other specified diabetes mellitus with diabetic peripheral angiopathy without gangrene: Secondary | ICD-10-CM | POA: Diagnosis not present

## 2016-06-12 DIAGNOSIS — M79671 Pain in right foot: Secondary | ICD-10-CM | POA: Diagnosis not present

## 2016-06-12 DIAGNOSIS — M19071 Primary osteoarthritis, right ankle and foot: Secondary | ICD-10-CM | POA: Diagnosis not present

## 2016-06-13 DIAGNOSIS — E1165 Type 2 diabetes mellitus with hyperglycemia: Secondary | ICD-10-CM | POA: Diagnosis not present

## 2016-06-19 DIAGNOSIS — E785 Hyperlipidemia, unspecified: Secondary | ICD-10-CM | POA: Diagnosis not present

## 2016-06-19 DIAGNOSIS — E559 Vitamin D deficiency, unspecified: Secondary | ICD-10-CM | POA: Diagnosis not present

## 2016-06-19 DIAGNOSIS — I482 Chronic atrial fibrillation: Secondary | ICD-10-CM | POA: Diagnosis not present

## 2016-06-19 DIAGNOSIS — E782 Mixed hyperlipidemia: Secondary | ICD-10-CM | POA: Diagnosis not present

## 2016-06-19 DIAGNOSIS — I10 Essential (primary) hypertension: Secondary | ICD-10-CM | POA: Diagnosis not present

## 2016-06-19 DIAGNOSIS — E039 Hypothyroidism, unspecified: Secondary | ICD-10-CM | POA: Diagnosis not present

## 2016-06-19 DIAGNOSIS — E119 Type 2 diabetes mellitus without complications: Secondary | ICD-10-CM | POA: Diagnosis not present

## 2016-06-19 DIAGNOSIS — R7301 Impaired fasting glucose: Secondary | ICD-10-CM | POA: Diagnosis not present

## 2016-06-22 DIAGNOSIS — I10 Essential (primary) hypertension: Secondary | ICD-10-CM | POA: Diagnosis not present

## 2016-06-22 DIAGNOSIS — E782 Mixed hyperlipidemia: Secondary | ICD-10-CM | POA: Diagnosis not present

## 2016-06-22 DIAGNOSIS — E119 Type 2 diabetes mellitus without complications: Secondary | ICD-10-CM | POA: Diagnosis not present

## 2016-06-22 DIAGNOSIS — K219 Gastro-esophageal reflux disease without esophagitis: Secondary | ICD-10-CM | POA: Diagnosis not present

## 2016-06-22 DIAGNOSIS — K8681 Exocrine pancreatic insufficiency: Secondary | ICD-10-CM | POA: Diagnosis not present

## 2016-06-22 DIAGNOSIS — G4733 Obstructive sleep apnea (adult) (pediatric): Secondary | ICD-10-CM | POA: Diagnosis not present

## 2016-06-22 DIAGNOSIS — E559 Vitamin D deficiency, unspecified: Secondary | ICD-10-CM | POA: Diagnosis not present

## 2016-06-25 ENCOUNTER — Other Ambulatory Visit: Payer: Self-pay | Admitting: Physician Assistant

## 2016-06-26 MED ORDER — DIPHENOXYLATE-ATROPINE 2.5-0.025 MG PO TABS: ORAL_TABLET | ORAL | 0 refills | Status: DC

## 2016-06-26 NOTE — Addendum Note (Signed)
Addended by: Barron Alvine on: 06/26/2016 08:31 AM   Modules accepted: Orders

## 2016-06-27 DIAGNOSIS — H35033 Hypertensive retinopathy, bilateral: Secondary | ICD-10-CM | POA: Diagnosis not present

## 2016-06-27 DIAGNOSIS — H25013 Cortical age-related cataract, bilateral: Secondary | ICD-10-CM | POA: Diagnosis not present

## 2016-06-27 DIAGNOSIS — H35363 Drusen (degenerative) of macula, bilateral: Secondary | ICD-10-CM | POA: Diagnosis not present

## 2016-06-27 DIAGNOSIS — H2513 Age-related nuclear cataract, bilateral: Secondary | ICD-10-CM | POA: Diagnosis not present

## 2016-07-04 DIAGNOSIS — E1165 Type 2 diabetes mellitus with hyperglycemia: Secondary | ICD-10-CM | POA: Diagnosis not present

## 2016-07-09 ENCOUNTER — Telehealth: Payer: Self-pay

## 2016-07-09 NOTE — Telephone Encounter (Signed)
The pt was advised to continue to take meds as directed and follow up as needed.

## 2016-07-30 DIAGNOSIS — E1165 Type 2 diabetes mellitus with hyperglycemia: Secondary | ICD-10-CM | POA: Diagnosis not present

## 2016-08-29 DIAGNOSIS — E1165 Type 2 diabetes mellitus with hyperglycemia: Secondary | ICD-10-CM | POA: Diagnosis not present

## 2016-09-04 DIAGNOSIS — E119 Type 2 diabetes mellitus without complications: Secondary | ICD-10-CM | POA: Diagnosis not present

## 2016-09-04 DIAGNOSIS — E782 Mixed hyperlipidemia: Secondary | ICD-10-CM | POA: Diagnosis not present

## 2016-09-04 DIAGNOSIS — N183 Chronic kidney disease, stage 3 (moderate): Secondary | ICD-10-CM | POA: Diagnosis not present

## 2016-09-04 DIAGNOSIS — I10 Essential (primary) hypertension: Secondary | ICD-10-CM | POA: Diagnosis not present

## 2016-09-07 DIAGNOSIS — M542 Cervicalgia: Secondary | ICD-10-CM | POA: Diagnosis not present

## 2016-09-07 DIAGNOSIS — R51 Headache: Secondary | ICD-10-CM | POA: Diagnosis not present

## 2016-09-14 DIAGNOSIS — J019 Acute sinusitis, unspecified: Secondary | ICD-10-CM | POA: Diagnosis not present

## 2016-09-14 DIAGNOSIS — M797 Fibromyalgia: Secondary | ICD-10-CM | POA: Diagnosis not present

## 2016-09-14 DIAGNOSIS — R5382 Chronic fatigue, unspecified: Secondary | ICD-10-CM | POA: Diagnosis not present

## 2016-09-14 DIAGNOSIS — B9689 Other specified bacterial agents as the cause of diseases classified elsewhere: Secondary | ICD-10-CM | POA: Diagnosis not present

## 2016-09-18 DIAGNOSIS — E785 Hyperlipidemia, unspecified: Secondary | ICD-10-CM | POA: Diagnosis not present

## 2016-09-18 DIAGNOSIS — E119 Type 2 diabetes mellitus without complications: Secondary | ICD-10-CM | POA: Diagnosis not present

## 2016-09-20 DIAGNOSIS — J0100 Acute maxillary sinusitis, unspecified: Secondary | ICD-10-CM | POA: Diagnosis not present

## 2016-09-20 DIAGNOSIS — H04129 Dry eye syndrome of unspecified lacrimal gland: Secondary | ICD-10-CM | POA: Diagnosis not present

## 2016-09-20 DIAGNOSIS — B029 Zoster without complications: Secondary | ICD-10-CM | POA: Diagnosis not present

## 2016-09-20 DIAGNOSIS — E119 Type 2 diabetes mellitus without complications: Secondary | ICD-10-CM | POA: Diagnosis not present

## 2016-09-20 DIAGNOSIS — E785 Hyperlipidemia, unspecified: Secondary | ICD-10-CM | POA: Diagnosis not present

## 2016-09-21 DIAGNOSIS — B9689 Other specified bacterial agents as the cause of diseases classified elsewhere: Secondary | ICD-10-CM | POA: Diagnosis not present

## 2016-09-21 DIAGNOSIS — J019 Acute sinusitis, unspecified: Secondary | ICD-10-CM | POA: Diagnosis not present

## 2016-09-21 DIAGNOSIS — K219 Gastro-esophageal reflux disease without esophagitis: Secondary | ICD-10-CM | POA: Diagnosis not present

## 2016-09-21 DIAGNOSIS — E119 Type 2 diabetes mellitus without complications: Secondary | ICD-10-CM | POA: Diagnosis not present

## 2016-09-24 DIAGNOSIS — B029 Zoster without complications: Secondary | ICD-10-CM | POA: Diagnosis not present

## 2016-09-24 DIAGNOSIS — H04123 Dry eye syndrome of bilateral lacrimal glands: Secondary | ICD-10-CM | POA: Diagnosis not present

## 2016-09-24 DIAGNOSIS — H179 Unspecified corneal scar and opacity: Secondary | ICD-10-CM | POA: Diagnosis not present

## 2016-10-02 DIAGNOSIS — Z6829 Body mass index (BMI) 29.0-29.9, adult: Secondary | ICD-10-CM | POA: Diagnosis not present

## 2016-10-02 DIAGNOSIS — Z1231 Encounter for screening mammogram for malignant neoplasm of breast: Secondary | ICD-10-CM | POA: Diagnosis not present

## 2016-10-02 DIAGNOSIS — B009 Herpesviral infection, unspecified: Secondary | ICD-10-CM | POA: Diagnosis not present

## 2016-10-02 DIAGNOSIS — Z01419 Encounter for gynecological examination (general) (routine) without abnormal findings: Secondary | ICD-10-CM | POA: Diagnosis not present

## 2016-10-03 DIAGNOSIS — E559 Vitamin D deficiency, unspecified: Secondary | ICD-10-CM | POA: Diagnosis not present

## 2016-10-03 DIAGNOSIS — D509 Iron deficiency anemia, unspecified: Secondary | ICD-10-CM | POA: Diagnosis not present

## 2016-10-03 DIAGNOSIS — R809 Proteinuria, unspecified: Secondary | ICD-10-CM | POA: Diagnosis not present

## 2016-10-03 DIAGNOSIS — Z79899 Other long term (current) drug therapy: Secondary | ICD-10-CM | POA: Diagnosis not present

## 2016-10-03 DIAGNOSIS — N183 Chronic kidney disease, stage 3 (moderate): Secondary | ICD-10-CM | POA: Diagnosis not present

## 2016-10-03 DIAGNOSIS — I10 Essential (primary) hypertension: Secondary | ICD-10-CM | POA: Diagnosis not present

## 2016-10-08 DIAGNOSIS — I509 Heart failure, unspecified: Secondary | ICD-10-CM | POA: Diagnosis not present

## 2016-10-08 DIAGNOSIS — R809 Proteinuria, unspecified: Secondary | ICD-10-CM | POA: Diagnosis not present

## 2016-10-08 DIAGNOSIS — E559 Vitamin D deficiency, unspecified: Secondary | ICD-10-CM | POA: Diagnosis not present

## 2016-10-08 DIAGNOSIS — N181 Chronic kidney disease, stage 1: Secondary | ICD-10-CM | POA: Diagnosis not present

## 2016-10-09 DIAGNOSIS — M797 Fibromyalgia: Secondary | ICD-10-CM | POA: Diagnosis not present

## 2016-10-09 DIAGNOSIS — E119 Type 2 diabetes mellitus without complications: Secondary | ICD-10-CM | POA: Diagnosis not present

## 2016-10-09 DIAGNOSIS — B0223 Postherpetic polyneuropathy: Secondary | ICD-10-CM | POA: Diagnosis not present

## 2016-10-09 DIAGNOSIS — I10 Essential (primary) hypertension: Secondary | ICD-10-CM | POA: Diagnosis not present

## 2016-10-17 DIAGNOSIS — E1165 Type 2 diabetes mellitus with hyperglycemia: Secondary | ICD-10-CM | POA: Diagnosis not present

## 2016-10-18 DIAGNOSIS — M2011 Hallux valgus (acquired), right foot: Secondary | ICD-10-CM | POA: Diagnosis not present

## 2016-10-18 DIAGNOSIS — M2012 Hallux valgus (acquired), left foot: Secondary | ICD-10-CM | POA: Diagnosis not present

## 2016-10-18 DIAGNOSIS — E1140 Type 2 diabetes mellitus with diabetic neuropathy, unspecified: Secondary | ICD-10-CM | POA: Diagnosis not present

## 2016-10-19 ENCOUNTER — Telehealth: Payer: Self-pay | Admitting: Gastroenterology

## 2016-10-19 NOTE — Telephone Encounter (Signed)
Left message on machine to call back  

## 2016-10-22 ENCOUNTER — Other Ambulatory Visit: Payer: Self-pay | Admitting: Gastroenterology

## 2016-10-22 NOTE — Telephone Encounter (Signed)
Prescription sent to the pharmacy by fax

## 2016-10-24 DIAGNOSIS — I10 Essential (primary) hypertension: Secondary | ICD-10-CM | POA: Diagnosis not present

## 2016-10-24 DIAGNOSIS — E559 Vitamin D deficiency, unspecified: Secondary | ICD-10-CM | POA: Diagnosis not present

## 2016-10-24 DIAGNOSIS — E119 Type 2 diabetes mellitus without complications: Secondary | ICD-10-CM | POA: Diagnosis not present

## 2016-10-26 ENCOUNTER — Encounter: Payer: Self-pay | Admitting: Gastroenterology

## 2016-10-26 ENCOUNTER — Ambulatory Visit (INDEPENDENT_AMBULATORY_CARE_PROVIDER_SITE_OTHER): Payer: Medicare Other | Admitting: Gastroenterology

## 2016-10-26 VITALS — BP 130/72 | HR 84 | Ht 63.0 in | Wt 163.8 lb

## 2016-10-26 DIAGNOSIS — K219 Gastro-esophageal reflux disease without esophagitis: Secondary | ICD-10-CM | POA: Diagnosis not present

## 2016-10-26 DIAGNOSIS — R197 Diarrhea, unspecified: Secondary | ICD-10-CM | POA: Diagnosis not present

## 2016-10-26 DIAGNOSIS — K8689 Other specified diseases of pancreas: Secondary | ICD-10-CM | POA: Diagnosis not present

## 2016-10-26 MED ORDER — PANCRELIPASE (LIP-PROT-AMYL) 24000-86250 UNITS PO CPEP: 8.0000 | ORAL_CAPSULE | ORAL | 3 refills | Status: DC

## 2016-10-26 MED ORDER — DEXLANSOPRAZOLE 60 MG PO CPDR: 60.0000 mg | DELAYED_RELEASE_CAPSULE | Freq: Every day | ORAL | 5 refills | Status: DC

## 2016-10-26 MED ORDER — PANCRELIPASE (LIP-PROT-AMYL) 24000-86250 UNITS PO CPEP: ORAL_CAPSULE | ORAL | 3 refills | Status: DC

## 2016-10-26 NOTE — Progress Notes (Signed)
I agre with the above note, plan

## 2016-10-26 NOTE — Progress Notes (Signed)
10/26/2016 EH SAUSEDA [id number] Dec 13, 1945   Review of pertinent gastrointestinal problems: 1. Chronic diarrhea: Colonoscopy Dr. Gala Romney 02/2013: pan diverticulosis, normal TI, biopsies randomly from colon were normal, a few small HPs were removed. 01/2013 celiac panel, TSH were both normal. 2. 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 Gala Romney), clinical stage III (uT2, pN1), status post an endoscopic biopsy 02/04/2013 confirming a duodenal carcinoid tumor, status post a duodenal resection 03/31/2013 Dr. Barry Dienes with no remaining tumor found in the duodenum and a positive portal lymph node;  Repeat upper endoscopy/EUS on 08/20/2013 confirming a persistent duodenal nodule with a biopsy confirming a low-grade neuroendocrine carcinoma. EGD 2016 Dr. Ardis Hughs, same; the previously noted duodenal nodule was still present in the duodenum, this is carcinoid and it was probably never removed during her duodenal resection surgery. 3. EUS 10/2014above also noted changes consistent with chronic pancreatitis  HISTORY OF PRESENT ILLNESS:  This is a 71 year old female who is known to Dr. Ardis Hughs. Has history of duodenal carcinoid as described above. This was still present on EGD in October 2017. Also has chronic pancreatitis/pancreatic insufficiency with symptoms of diarrhea. Is currently taking Creon 24,000 units with meals and 12,000 units with next. She has noticed some improvement in her diarrhea with this, but still continues to have some symptoms.  She also complains of ongoing issues with acid reflux, indigestion, nausea. She says the Prilosec, Zantac, Carafate have not helped her symptoms.  She was on Dexilant previously and had some left home. She just resumed that yesterday.   Past Medical History:  Diagnosis Date  . Allergy   . Anxiety   . Arthritis   . Cancer [facility])    carcinoid tumor stomach  . Cataract   . Depression   . Diabetes  mellitus   . Fibromyalgia   . GERD (gastroesophageal reflux disease)   . Headache(784.0)   . Hyperlipidemia   . Hyperplastic colon polyp 02/04/2013  . Hypertension   . IBS (irritable bowel syndrome)   . Osteoporosis   . Polyp of rectum 02/04/2013  . Renal disorder   . Shortness of breath   . Sleep apnea   . Vitamin D deficiency    Past Surgical History:  Procedure Laterality Date  . ABDOMINAL HYSTERECTOMY     Cervical cancer, with incidental appendectomy  . APPENDECTOMY    . BIOPSY N/A 02/04/2013   Procedure: BIOPSY;  Surgeon: Daneil Dolin, MD;  Location: [facility];  Service: Endoscopy;  Laterality: N/A;  SB BX AND RANDOM COLON BX  . bladder tack    . BREAST LUMPECTOMY     benign  . COLONOSCOPY  09/22/2003   RMR: Diminutive rectal polyps cold biopsy/removed.  Otherwise normal rectal mucosa/Pancolonic diverticula.  The remainder of the colonic mucosa appeared normal  . COLONOSCOPY WITH ESOPHAGOGASTRODUODENOSCOPY (EGD) N/A 02/04/2013   Procedure: COLONOSCOPY WITH ESOPHAGOGASTRODUODENOSCOPY (EGD);  Surgeon: Daneil Dolin, MD;  Location: [facility];  Service: Endoscopy;  Laterality: N/A;  10:15-moved to 9:30am  . ESOPHAGOGASTRODUODENOSCOPY  11/19/2006   IOM:[id number] Schatzki's ring with overlying distal esophageal erosions consistent with erosive reflux esophagitis, status post dilation and disruption of ring as described above/ Small hiatal hernia/Otherwise normal stomach, first duodenum and second duodenum  . EUS N/A 02/12/2013   Procedure: UPPER ENDOSCOPIC ULTRASOUND (EUS) LINEAR;  Surgeon: Milus Banister, MD;  Location: [facility];  Service: Endoscopy;  Laterality: N/A;  . EUS N/A 08/20/2013  Procedure: UPPER ENDOSCOPIC ULTRASOUND (EUS) LINEAR;  Surgeon: Milus Banister, MD;  Location: [facility];  Service: Endoscopy;  Laterality: N/A;  . LAPAROSCOPIC SMALL BOWEL RESECTION N/A 03/31/2013   Procedure: LAPAROSCOPIC  ASSISTED CONVERTED TO OPEN RESECTION DUODENAL  MASS/UPPER ENDOSCOPY;  Surgeon: Stark Klein, MD;  Location: [facility];  Service: General;  Laterality: N/A;  . TONSILLECTOMY      reports that she has quit smoking. Her smoking use included Cigarettes. She quit smokeless tobacco use about 46 years ago. She reports that she does not drink alcohol or use drugs. family history includes Cancer in her father and mother. Allergies  Allergen Reactions  . Other     Metal alloy-breaks her out-esp. Metal surgical instruments or some metal implanted in me  . Asa [Aspirin]     Kidney doctor told her to not take ASA  . Erythromycin     Doesn't remember   . Famciclovir     migrane  . Hydrocodone     "makes me lose my mind"-hallucinations  . Ibuprofen     Kidney doctor told her to not take ibuprofen.  . Lactase Diarrhea  . Lactose Intolerance (Gi) Diarrhea and Other (See Comments)    Severe diarrhea  . Morphine And Related Other (See Comments)    Pt says Morphine makes her go out of her head  . Oxycodone     hallucinations  . Promethazine Nausea And Vomiting  . Tylenol [Acetaminophen]     Knocks her out.      Outpatient Encounter Prescriptions as of 10/26/2016  Medication Sig  . ALPRAZolam (XANAX) 1 MG tablet Take 0.5-1 mg by mouth at bedtime as needed for anxiety.   Marland Kitchen antiseptic oral rinse (BIOTENE) LIQD 15 mLs by Mouth Rinse route as needed for dry mouth.  . Calcium Carbonate-Simethicone (ALKA-SELTZER HEARTBURN + GAS) 750-80 MG CHEW Chew by mouth as needed.  . Cholecalciferol (VITAMIN D PO) Take 5,000 Units by mouth once a week. Mondays  . Cyanocobalamin (VITAMIN B-12 PO) Take 1 tablet by mouth daily.  Marland Kitchen dexlansoprazole (DEXILANT) 60 MG capsule Take 60 mg by mouth daily.   . diphenoxylate-atropine (LOMOTIL) 2.5-0.025 MG tablet TAKE 2 TABLETS BY MOUTH UP TO FOUR TIMES DAILY AS NEEDED.  Marland Kitchen gabapentin (NEURONTIN) 300 MG capsule Take 300 mg by mouth daily.   Marland Kitchen glipiZIDE (GLUCOTROL) 2.5 mg TABS tablet Take 2.5 mg by mouth daily before breakfast.  IF blood sugar drops below 100 for 3 days in a row cut dose in half  . Inulin (FIBER CHOICE FRUITY BITES) 1.5 g CHEW Chew 1 tablet by mouth daily.  . lipase/protease/amylase (CREON) 12000 units CPEP capsule 2 pills with every meal by mouth and 1 pill by mouth with every snack  . losartan-hydrochlorothiazide (HYZAAR) 100-25 MG tablet Take 1 tablet by mouth daily.   . Nutritional Supplements (DHEA PO) Take 1 tablet by mouth at bedtime.  Marland Kitchen oxymetazoline (AFRIN) 0.05 % nasal spray Place 1 spray into both nostrils 2 (two) times daily as needed for congestion.  . pravastatin (PRAVACHOL) 10 MG tablet Take 10 mg by mouth daily.  Marland Kitchen Propylene Glycol-Glycerin (SOOTHE OP) Place 1 drop into both eyes 2 (two) times daily.  Marland Kitchen triamcinolone cream (KENALOG) 0.1 % Apply 1 application topically 2 (two) times daily.  . valACYclovir (VALTREX) 500 MG tablet Take 500 mg by mouth as needed. Reported on 10/31/2015  . verapamil (CALAN-SR) 240 MG CR tablet Take 240 mg by mouth every morning.   . [DISCONTINUED] escitalopram (  LEXAPRO) 20 MG tablet Take 20 mg by mouth daily.  . [DISCONTINUED] omeprazole (PRILOSEC) 40 MG capsule Take 1 capsule (40 mg total) by mouth daily. Take Omeprazole 40 mg daily 30 minutes before first meal of the day.pt requested 30 with 11 refills vs 90 day supply  . [DISCONTINUED] Probiotic Product (RESTORA PO) Take 1 tablet by mouth daily.  . [DISCONTINUED] ranitidine (ZANTAC) 300 MG tablet Take 1 tablet (300 mg total) by mouth 2 (two) times daily.   Facility-Administered Encounter Medications as of 10/26/2016  Medication  . 0.9 %  sodium chloride infusion     REVIEW OF SYSTEMS  : All other systems reviewed and negative except where noted in the History of Present Illness.   PHYSICAL EXAM: BP 130/72   Pulse 84   Ht 5\' 3"  (1.6 m)   Wt 163 lb 12.8 oz (74.3 kg)   BMI 29.02 kg/m  General: Well developed black female in no acute distress Head: Normocephalic and atraumatic Eyes:  Sclerae  anicteric, conjunctiva pink. Ears: Normal auditory acuity Lungs: Clear throughout to auscultation; no increased WOB. Heart: Regular rate and rhythm Abdomen: Soft, non-distended. Normal bowel sounds.  Minimal diffuse TTP. Musculoskeletal: Symmetrical with no gross deformities  Skin: No lesions on visible extremities Extremities: No edema  Neurological: Alert oriented x 4, grossly non-focal Psychological:  Alert and cooperative. Normal mood and affect  ASSESSMENT AND PLAN: -GERD:  Prilosec, Zantac, Carafate not helping. Was on Dexilant previously and just recently restarted that. She will retry that again. She was skeptical though due to the talk about PPIs being linked to development of Alzheimer's dementia. I reassured her that has been disproven. -Chronic diarrhea:  Due to chronic pancreatitis/pancreatic insufficiency.  I feel that she may be under dosed with her pancreatic enzymes. I'm going to increase this to 48,000 units with meals and 25,000 units with snacks. We will see if she gets any further improvement in her diarrhea with that.  Will try IBgard for gas.  *25 minutes spent with patient with over 50% in counseling and discussion of options.   CC:  Celene Squibb, MD

## 2016-10-26 NOTE — Patient Instructions (Addendum)
We have sent the following medications to your pharmacy for you to pick up at your convenience:  Dexilant 60 mg daily   Creeon 24000 units 2 tabs with meals and with snacks.   We have given you samples of the IB gard to take as directed.

## 2016-10-29 ENCOUNTER — Telehealth: Payer: Self-pay | Admitting: Gastroenterology

## 2016-10-29 NOTE — Telephone Encounter (Signed)
Spoke to patient she is very fearful that she will go into the donut hole soon and wants to make sure she gets the cheapest prescription. I explained to her that the speciality pharmacy will always try to get the lowest price. She verbalized understanding. Will speak to [facility] about getting her some assistance.

## 2016-10-29 NOTE — Telephone Encounter (Signed)
Patient called back in and has questions regarding creon dosing. Best # [phone]

## 2016-10-30 NOTE — Telephone Encounter (Signed)
Spoke to patient and explained to her how the speciality pharmacy works and that they will set up patient assistance for her. Patient also wanted to verify the dosage and how to take it. Called [facility] and spoke to them and they will be able to help her if she just calls and provides her income amount and proof. She verbalized understanding.

## 2016-11-02 ENCOUNTER — Telehealth: Payer: Self-pay | Admitting: Gastroenterology

## 2016-11-02 ENCOUNTER — Telehealth: Payer: Self-pay | Admitting: Internal Medicine

## 2016-11-02 NOTE — Telephone Encounter (Signed)
Per York Cerise note the pt needs to call [facility] and give income information.  The pt was provided with the [facility] number and she states she will call

## 2016-11-02 NOTE — Telephone Encounter (Signed)
The pt is aware she needs to take her Creon 2 with every meal and 1 with every snack.  Pt verbalized understanding.

## 2016-11-02 NOTE — Telephone Encounter (Signed)
Left message on machine to call back  

## 2016-11-05 ENCOUNTER — Telehealth: Payer: Self-pay | Admitting: Gastroenterology

## 2016-11-05 NOTE — Telephone Encounter (Signed)
Not sure her symptoms are related to the creon increase. Lets have her restart creon at her previous dose, schedule for the next two weeks then she needs to try again to increase to the higher dosing schedule again and call the office if her symptoms recur.   Thanks

## 2016-11-05 NOTE — Telephone Encounter (Signed)
The pt states she had a HA, confusion and nausea after taking Creon.  She says she has not taken them since Saturday and has began to feel better.  She recently increased from 12,000 capsules 2 three times daily and 1 with every snack to 24,000 capsules 2 three times daily and 1 with every snack.  Please advise

## 2016-11-05 NOTE — Telephone Encounter (Signed)
Pt notified and try the lower dose and call back in 2 weeks

## 2016-11-09 ENCOUNTER — Telehealth: Payer: Self-pay | Admitting: Gastroenterology

## 2016-11-12 ENCOUNTER — Other Ambulatory Visit: Payer: Self-pay | Admitting: Gastroenterology

## 2016-11-13 NOTE — Telephone Encounter (Signed)
Pt has been notified that her creon has been sent to the pharmacy.

## 2016-11-14 ENCOUNTER — Telehealth: Payer: Self-pay | Admitting: Gastroenterology

## 2016-11-14 NOTE — Telephone Encounter (Signed)
[facility] will fax over an application for free Creon to be filled out

## 2016-11-26 DIAGNOSIS — E559 Vitamin D deficiency, unspecified: Secondary | ICD-10-CM | POA: Diagnosis not present

## 2016-11-26 DIAGNOSIS — R5382 Chronic fatigue, unspecified: Secondary | ICD-10-CM | POA: Diagnosis not present

## 2016-11-26 DIAGNOSIS — E119 Type 2 diabetes mellitus without complications: Secondary | ICD-10-CM | POA: Diagnosis not present

## 2016-11-26 DIAGNOSIS — I10 Essential (primary) hypertension: Secondary | ICD-10-CM | POA: Diagnosis not present

## 2016-12-06 ENCOUNTER — Other Ambulatory Visit: Payer: Medicare Other

## 2016-12-06 ENCOUNTER — Ambulatory Visit: Payer: Medicare Other | Admitting: Oncology

## 2016-12-10 ENCOUNTER — Ambulatory Visit (HOSPITAL_BASED_OUTPATIENT_CLINIC_OR_DEPARTMENT_OTHER): Payer: Medicare Other | Admitting: Oncology

## 2016-12-10 ENCOUNTER — Other Ambulatory Visit (HOSPITAL_BASED_OUTPATIENT_CLINIC_OR_DEPARTMENT_OTHER): Payer: Medicare Other

## 2016-12-10 VITALS — BP 140/50 | HR 48 | Temp 98.4°F | Resp 18 | Ht 63.0 in | Wt 163.8 lb

## 2016-12-10 DIAGNOSIS — C7A010 Malignant carcinoid tumor of the duodenum: Secondary | ICD-10-CM

## 2016-12-10 DIAGNOSIS — I10 Essential (primary) hypertension: Secondary | ICD-10-CM

## 2016-12-10 DIAGNOSIS — E119 Type 2 diabetes mellitus without complications: Secondary | ICD-10-CM

## 2016-12-10 DIAGNOSIS — M1990 Unspecified osteoarthritis, unspecified site: Secondary | ICD-10-CM | POA: Diagnosis not present

## 2016-12-10 DIAGNOSIS — D3A098 Benign carcinoid tumors of other sites: Secondary | ICD-10-CM

## 2016-12-10 DIAGNOSIS — Z923 Personal history of irradiation: Secondary | ICD-10-CM | POA: Diagnosis not present

## 2016-12-10 DIAGNOSIS — K621 Rectal polyp: Secondary | ICD-10-CM | POA: Diagnosis not present

## 2016-12-10 NOTE — Progress Notes (Signed)
  [facility] OFFICE PROGRESS NOTE   Diagnosis: Carcinoid tumor  INTERVAL HISTORY:   Ms. Formoso returns as scheduled. She has multiple complaints including malaise, intermittent diarrhea, intermittent nausea, and reflux symptoms. She is having difficulty paying for Creon. She reports diarrhea has improved.  Objective:  Vital signs in last 24 hours:  Blood pressure (!) 140/50, pulse (!) 48, temperature 98.4 F (36.9 C), temperature source Oral, resp. rate 18, height 5\' 3"  (1.6 m), weight 163 lb 12.8 oz (74.3 kg), SpO2 99 %. Repeat manual pulse-56    HEENT: Neck without mass Lymphatics: No cervical, supraclavicular, or inguinal nodes Resp: Lungs clear bilaterally Cardio: Regular rate and rhythm GI: No hepatomegaly, no mass, nontender Vascular: No leg edema   Lab Results: Chromogranin A-pending Chromogranin A on 03/09/1999 17-6  Medications: I have reviewed the patient's current medications.  Assessment/Plan: 1. 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, clinical stage III (uT2, pN1), status post an endoscopic biopsy 02/04/2013 confirming a duodenal carcinoid tumor, status post a duodenal resection 03/31/2013 with no remaining tumor found in the duodenum and a positive portal lymph node   Repeat upper endoscopy/EUS on 08/20/2013 confirming a persistent duodenal nodule with a biopsy confirming a low-grade neuroendocrine carcinoma  Repeat upper endoscopy 09/27/2014 with a duodenal nodule proximal to a surgical scar, biopsy confirmed a well-differentiated neuroendocrine tumor 2. Diarrhea-intermittent- 3. Diabetes  4. Sleep apnea  5. Fibromyalgia  6. Hypertension  7. Gastroesophageal reflux disease  8. Status post dilatation of a Schatzki's ring 02/04/2013  9. Vitiligo and hyperpigmented skin rash-evaluated by dermatology     Disposition:  Ms. Pickerill appears stable from an oncology standpoint. There is no  clinical evidence for progression of the carcinoid tumor or development of carcinoid syndrome. She continues follow-up with gastroenterology for management of reflux disease, chronic diarrhea, and chronic pancreatitis.  We will follow-up on the chromogranin A level from today. She would like to continue follow-up at the [facility]. Ms. Sok will return for an office visit in 9 months.  Donneta Romberg, MD  12/10/2016  2:56 PM

## 2016-12-11 LAB — CHROMOGRANIN A: CHROMOGRAN A: 5 nmol/L (ref 0–5)

## 2016-12-12 ENCOUNTER — Telehealth: Payer: Self-pay

## 2016-12-12 NOTE — Progress Notes (Signed)
No answer, unable to leave VM

## 2016-12-12 NOTE — Telephone Encounter (Signed)
Unable to reach patient or leave VM ?

## 2016-12-12 NOTE — Telephone Encounter (Signed)
-----   Message from Ladell Pier, MD sent at 12/12/2016  7:21 AM EDT ----- Please call patient, chromogranin is normal

## 2016-12-13 ENCOUNTER — Ambulatory Visit: Payer: Medicare Other | Admitting: Oncology

## 2016-12-13 ENCOUNTER — Other Ambulatory Visit: Payer: Medicare Other

## 2016-12-15 DIAGNOSIS — I10 Essential (primary) hypertension: Secondary | ICD-10-CM | POA: Diagnosis not present

## 2016-12-15 DIAGNOSIS — E559 Vitamin D deficiency, unspecified: Secondary | ICD-10-CM | POA: Diagnosis not present

## 2016-12-15 DIAGNOSIS — F411 Generalized anxiety disorder: Secondary | ICD-10-CM | POA: Diagnosis not present

## 2016-12-15 DIAGNOSIS — E119 Type 2 diabetes mellitus without complications: Secondary | ICD-10-CM | POA: Diagnosis not present

## 2016-12-18 ENCOUNTER — Telehealth: Payer: Self-pay

## 2016-12-18 DIAGNOSIS — B351 Tinea unguium: Secondary | ICD-10-CM | POA: Diagnosis not present

## 2016-12-18 DIAGNOSIS — E1140 Type 2 diabetes mellitus with diabetic neuropathy, unspecified: Secondary | ICD-10-CM | POA: Diagnosis not present

## 2016-12-18 DIAGNOSIS — M2012 Hallux valgus (acquired), left foot: Secondary | ICD-10-CM | POA: Diagnosis not present

## 2016-12-18 DIAGNOSIS — M2011 Hallux valgus (acquired), right foot: Secondary | ICD-10-CM | POA: Diagnosis not present

## 2016-12-18 DIAGNOSIS — E1165 Type 2 diabetes mellitus with hyperglycemia: Secondary | ICD-10-CM | POA: Diagnosis not present

## 2016-12-18 NOTE — Telephone Encounter (Signed)
Spoke with daughter concerning upcoming appointment for 09/12/2017

## 2016-12-28 DIAGNOSIS — Z780 Asymptomatic menopausal state: Secondary | ICD-10-CM | POA: Diagnosis not present

## 2016-12-28 DIAGNOSIS — Z79899 Other long term (current) drug therapy: Secondary | ICD-10-CM | POA: Diagnosis not present

## 2016-12-28 DIAGNOSIS — E119 Type 2 diabetes mellitus without complications: Secondary | ICD-10-CM | POA: Diagnosis not present

## 2016-12-28 DIAGNOSIS — R5383 Other fatigue: Secondary | ICD-10-CM | POA: Diagnosis not present

## 2016-12-28 DIAGNOSIS — F0281 Dementia in other diseases classified elsewhere with behavioral disturbance: Secondary | ICD-10-CM | POA: Diagnosis not present

## 2016-12-28 DIAGNOSIS — I10 Essential (primary) hypertension: Secondary | ICD-10-CM | POA: Diagnosis not present

## 2016-12-28 DIAGNOSIS — D539 Nutritional anemia, unspecified: Secondary | ICD-10-CM | POA: Diagnosis not present

## 2016-12-28 DIAGNOSIS — E7800 Pure hypercholesterolemia, unspecified: Secondary | ICD-10-CM | POA: Diagnosis not present

## 2016-12-28 DIAGNOSIS — F0390 Unspecified dementia without behavioral disturbance: Secondary | ICD-10-CM | POA: Diagnosis not present

## 2017-01-03 DIAGNOSIS — E1165 Type 2 diabetes mellitus with hyperglycemia: Secondary | ICD-10-CM | POA: Diagnosis not present

## 2017-01-09 DIAGNOSIS — F0390 Unspecified dementia without behavioral disturbance: Secondary | ICD-10-CM | POA: Diagnosis not present

## 2017-01-15 DIAGNOSIS — I10 Essential (primary) hypertension: Secondary | ICD-10-CM | POA: Diagnosis not present

## 2017-01-15 DIAGNOSIS — M797 Fibromyalgia: Secondary | ICD-10-CM | POA: Diagnosis not present

## 2017-01-15 DIAGNOSIS — Z79899 Other long term (current) drug therapy: Secondary | ICD-10-CM | POA: Diagnosis not present

## 2017-01-15 DIAGNOSIS — E119 Type 2 diabetes mellitus without complications: Secondary | ICD-10-CM | POA: Diagnosis not present

## 2017-01-15 DIAGNOSIS — E7800 Pure hypercholesterolemia, unspecified: Secondary | ICD-10-CM | POA: Diagnosis not present

## 2017-01-18 ENCOUNTER — Telehealth (HOSPITAL_COMMUNITY): Payer: Self-pay

## 2017-01-25 ENCOUNTER — Other Ambulatory Visit: Payer: Self-pay | Admitting: Gastroenterology

## 2017-01-31 DIAGNOSIS — M2011 Hallux valgus (acquired), right foot: Secondary | ICD-10-CM | POA: Diagnosis not present

## 2017-01-31 DIAGNOSIS — M2012 Hallux valgus (acquired), left foot: Secondary | ICD-10-CM | POA: Diagnosis not present

## 2017-01-31 DIAGNOSIS — E1165 Type 2 diabetes mellitus with hyperglycemia: Secondary | ICD-10-CM | POA: Diagnosis not present

## 2017-01-31 DIAGNOSIS — E1140 Type 2 diabetes mellitus with diabetic neuropathy, unspecified: Secondary | ICD-10-CM | POA: Diagnosis not present

## 2017-02-05 ENCOUNTER — Telehealth: Payer: Self-pay | Admitting: Oncology

## 2017-02-05 NOTE — Telephone Encounter (Signed)
Faxed pt medical records to [facility] [phone]

## 2017-02-07 DIAGNOSIS — E1165 Type 2 diabetes mellitus with hyperglycemia: Secondary | ICD-10-CM | POA: Diagnosis not present

## 2017-02-07 DIAGNOSIS — M129 Arthropathy, unspecified: Secondary | ICD-10-CM | POA: Diagnosis not present

## 2017-02-07 DIAGNOSIS — R1084 Generalized abdominal pain: Secondary | ICD-10-CM | POA: Diagnosis not present

## 2017-02-07 DIAGNOSIS — E1140 Type 2 diabetes mellitus with diabetic neuropathy, unspecified: Secondary | ICD-10-CM | POA: Diagnosis not present

## 2017-02-07 DIAGNOSIS — R300 Dysuria: Secondary | ICD-10-CM | POA: Diagnosis not present

## 2017-02-07 DIAGNOSIS — M545 Low back pain: Secondary | ICD-10-CM | POA: Diagnosis not present

## 2017-02-12 DIAGNOSIS — E1165 Type 2 diabetes mellitus with hyperglycemia: Secondary | ICD-10-CM | POA: Diagnosis not present

## 2017-02-12 DIAGNOSIS — R1084 Generalized abdominal pain: Secondary | ICD-10-CM | POA: Diagnosis not present

## 2017-02-12 DIAGNOSIS — E1140 Type 2 diabetes mellitus with diabetic neuropathy, unspecified: Secondary | ICD-10-CM | POA: Diagnosis not present

## 2017-02-12 DIAGNOSIS — R300 Dysuria: Secondary | ICD-10-CM | POA: Diagnosis not present

## 2017-02-15 DIAGNOSIS — N183 Chronic kidney disease, stage 3 (moderate): Secondary | ICD-10-CM | POA: Diagnosis not present

## 2017-02-15 DIAGNOSIS — E559 Vitamin D deficiency, unspecified: Secondary | ICD-10-CM | POA: Diagnosis not present

## 2017-02-15 DIAGNOSIS — N281 Cyst of kidney, acquired: Secondary | ICD-10-CM | POA: Diagnosis not present

## 2017-02-26 DIAGNOSIS — Z1331 Encounter for screening for depression: Secondary | ICD-10-CM | POA: Diagnosis not present

## 2017-02-26 DIAGNOSIS — I10 Essential (primary) hypertension: Secondary | ICD-10-CM | POA: Diagnosis not present

## 2017-02-26 DIAGNOSIS — R0602 Shortness of breath: Secondary | ICD-10-CM | POA: Diagnosis not present

## 2017-02-26 DIAGNOSIS — E782 Mixed hyperlipidemia: Secondary | ICD-10-CM | POA: Diagnosis not present

## 2017-02-26 DIAGNOSIS — E7800 Pure hypercholesterolemia, unspecified: Secondary | ICD-10-CM | POA: Diagnosis not present

## 2017-02-26 DIAGNOSIS — E119 Type 2 diabetes mellitus without complications: Secondary | ICD-10-CM | POA: Diagnosis not present

## 2017-02-26 DIAGNOSIS — R8290 Unspecified abnormal findings in urine: Secondary | ICD-10-CM | POA: Diagnosis not present

## 2017-02-26 DIAGNOSIS — Z23 Encounter for immunization: Secondary | ICD-10-CM | POA: Diagnosis not present

## 2017-02-26 DIAGNOSIS — Z79899 Other long term (current) drug therapy: Secondary | ICD-10-CM | POA: Diagnosis not present

## 2017-02-26 DIAGNOSIS — Z1339 Encounter for screening examination for other mental health and behavioral disorders: Secondary | ICD-10-CM | POA: Diagnosis not present

## 2017-03-01 ENCOUNTER — Other Ambulatory Visit: Payer: Self-pay | Admitting: Gastroenterology

## 2017-03-04 DIAGNOSIS — E1165 Type 2 diabetes mellitus with hyperglycemia: Secondary | ICD-10-CM | POA: Diagnosis not present

## 2017-03-04 DIAGNOSIS — E1140 Type 2 diabetes mellitus with diabetic neuropathy, unspecified: Secondary | ICD-10-CM | POA: Diagnosis not present

## 2017-03-12 DIAGNOSIS — I517 Cardiomegaly: Secondary | ICD-10-CM | POA: Diagnosis not present

## 2017-03-19 DIAGNOSIS — E1165 Type 2 diabetes mellitus with hyperglycemia: Secondary | ICD-10-CM | POA: Diagnosis not present

## 2017-03-19 DIAGNOSIS — Z79899 Other long term (current) drug therapy: Secondary | ICD-10-CM | POA: Diagnosis not present

## 2017-03-19 DIAGNOSIS — E7800 Pure hypercholesterolemia, unspecified: Secondary | ICD-10-CM | POA: Diagnosis not present

## 2017-03-19 DIAGNOSIS — I10 Essential (primary) hypertension: Secondary | ICD-10-CM | POA: Diagnosis not present

## 2017-03-19 DIAGNOSIS — E1149 Type 2 diabetes mellitus with other diabetic neurological complication: Secondary | ICD-10-CM | POA: Diagnosis not present

## 2017-03-21 IMAGING — MG MM DIGITAL SCREENING BILATERAL
6 series · 6 of 6 positions shown · non-contrast
Comparison: Previous exam(s).

CLINICAL DATA: Screening.

EXAM:
DIGITAL SCREENING BILATERAL MAMMOGRAM WITH CAD

[L CC]
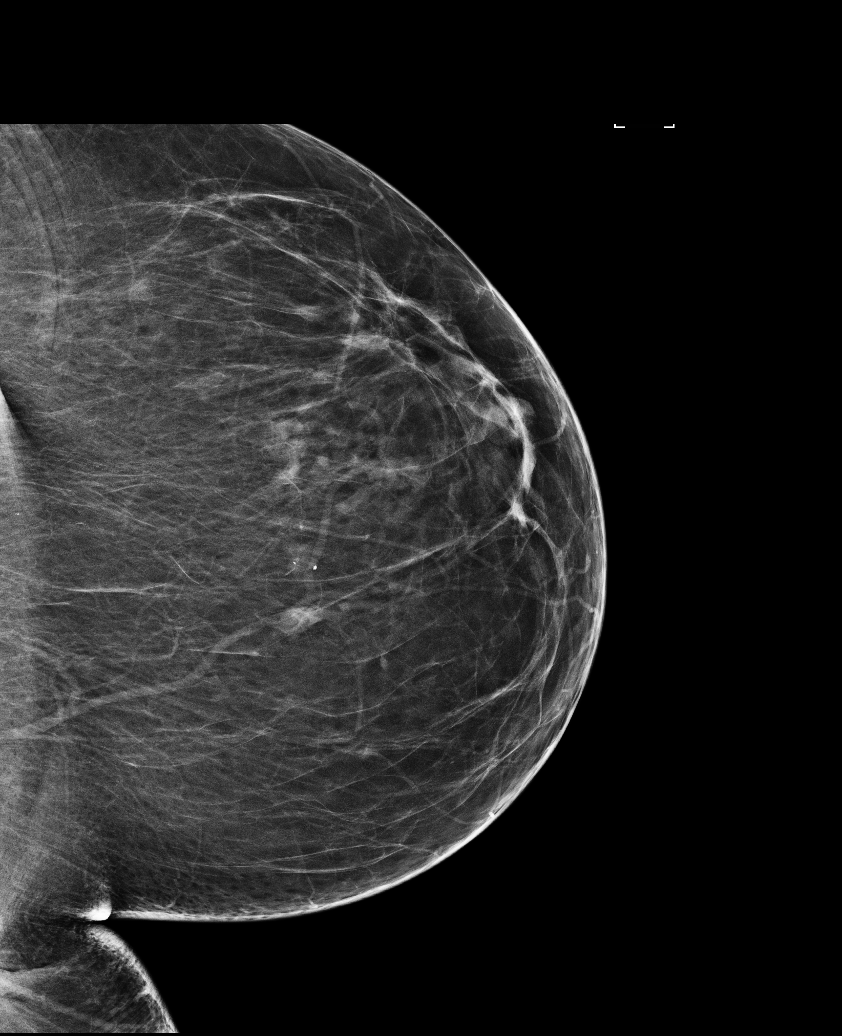

[R MLO (1 of 2)]
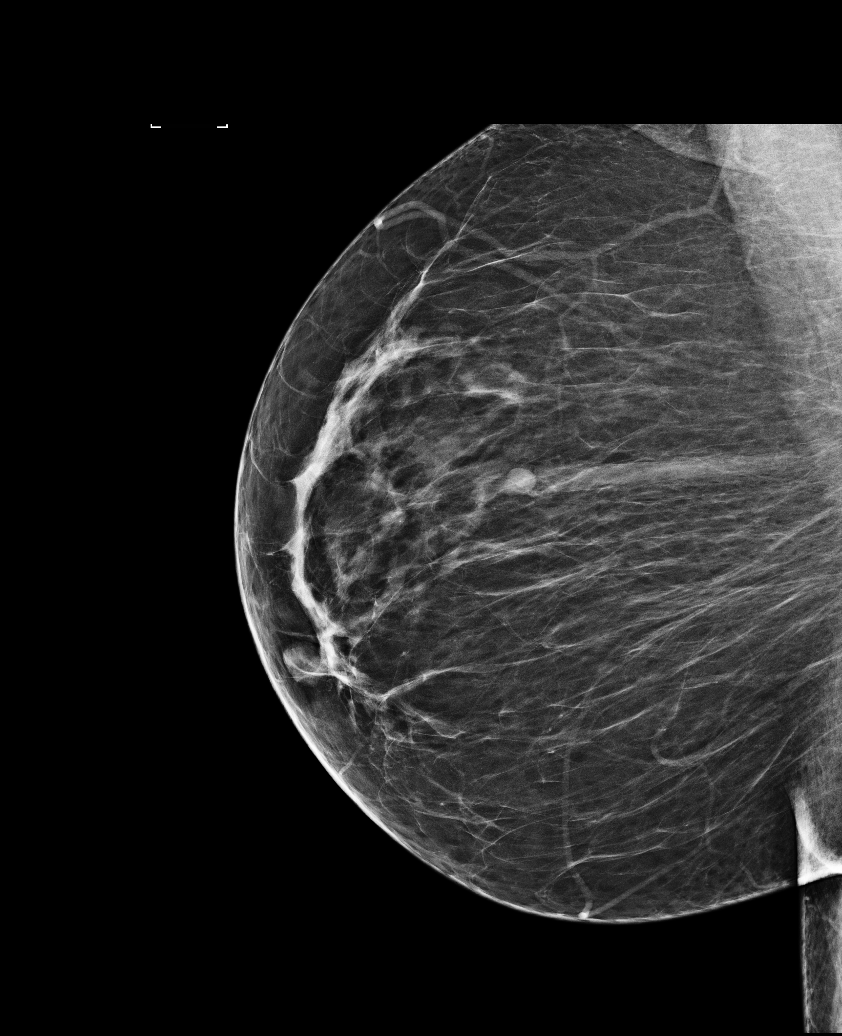

[R CC]
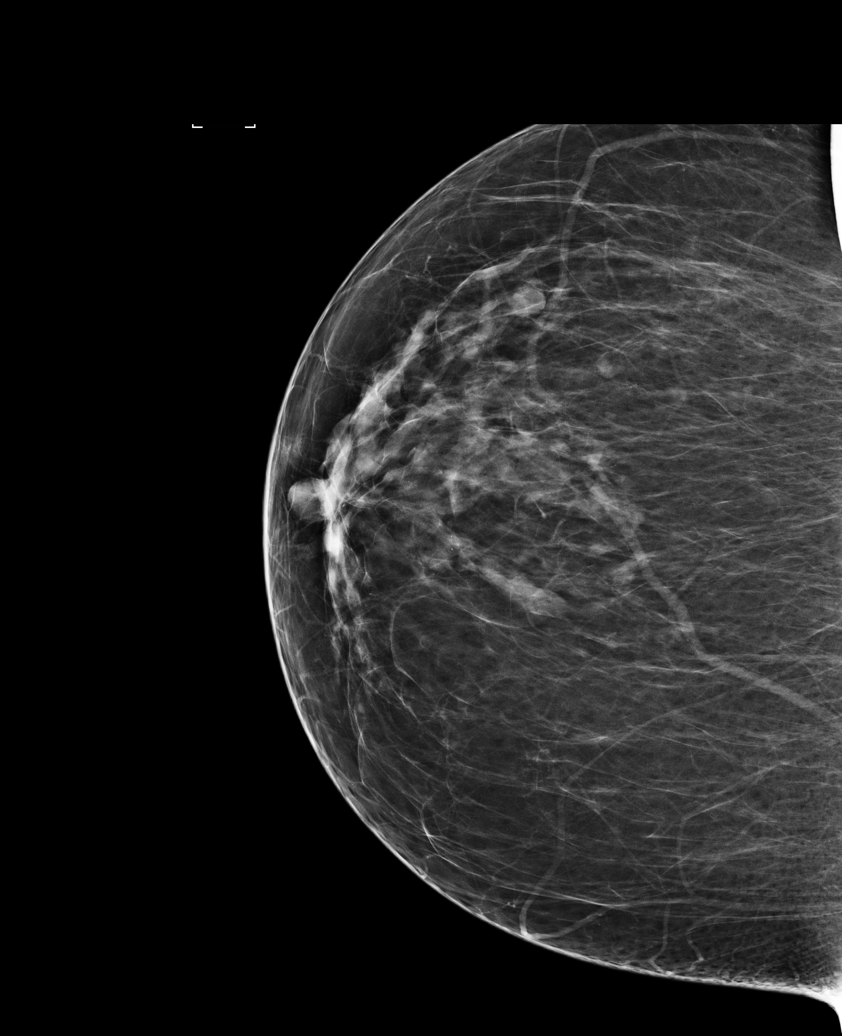

[R MLO (2 of 2)]
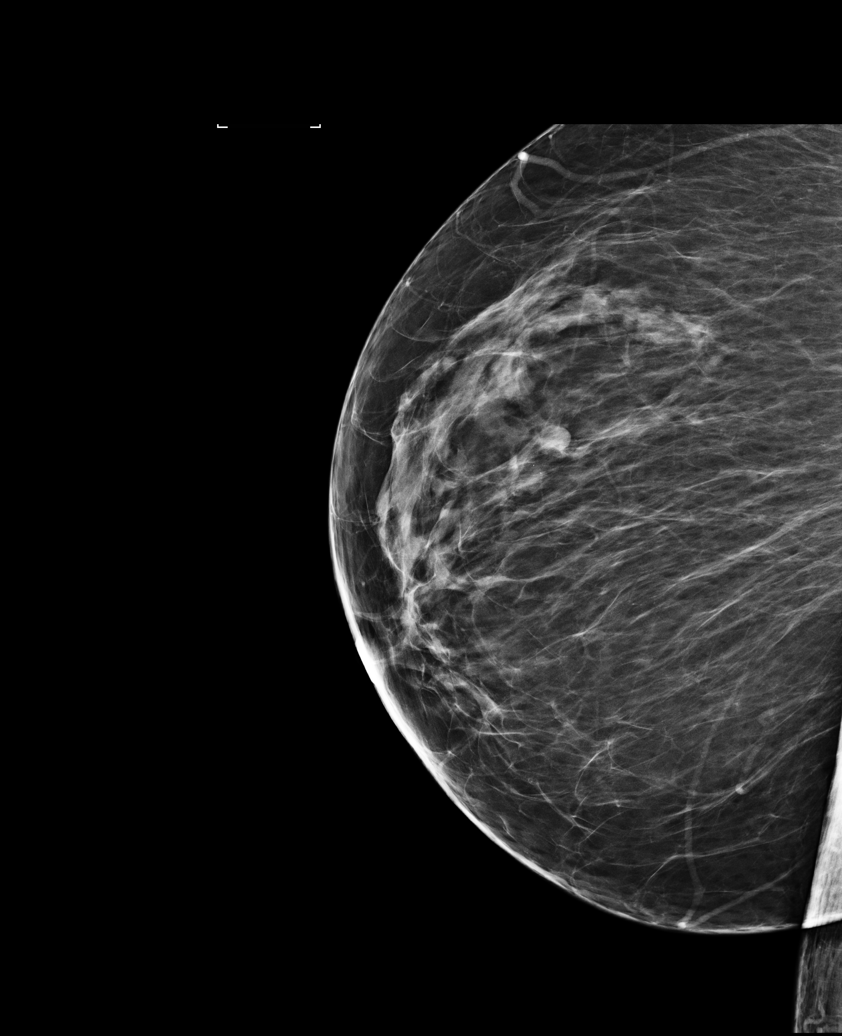

[L MLO (1 of 2)]
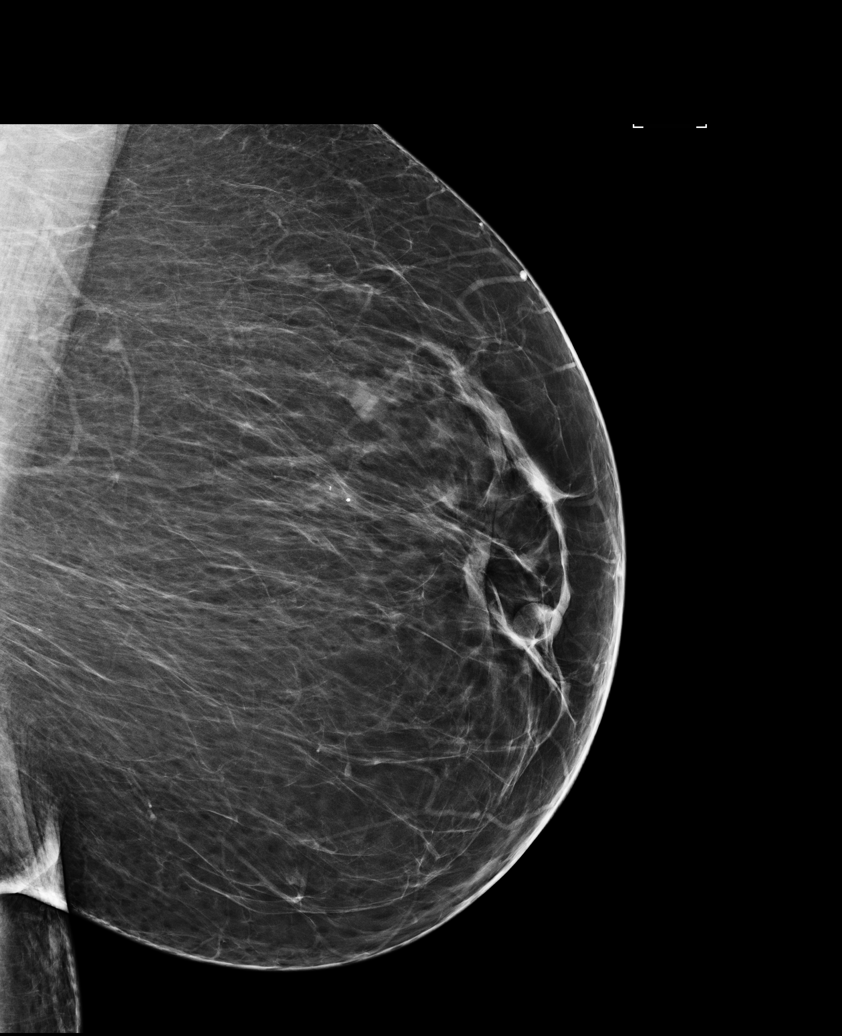

[L MLO (2 of 2)]
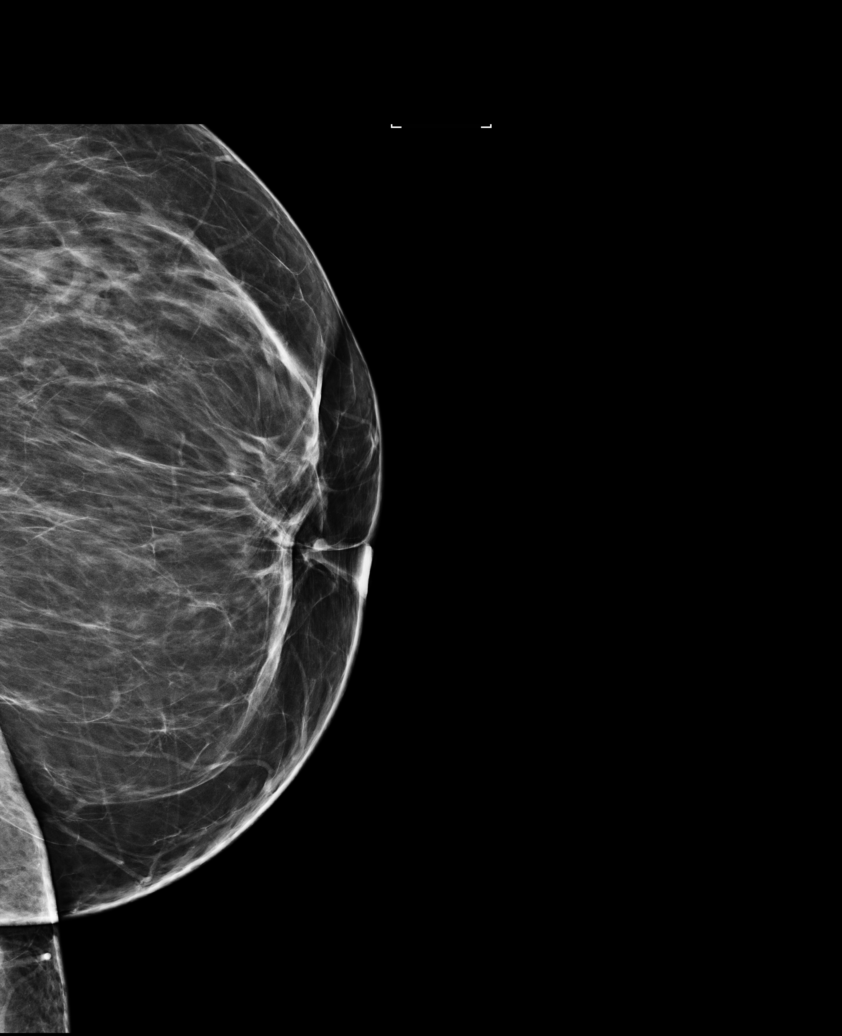

[6 of 6 positions shown; findings below may reference images not displayed]

ACR Breast Density Category b: There are scattered areas of
fibroglandular density.
FINDINGS: There are no findings suspicious for malignancy. Images were
processed with CAD.
IMPRESSION: No mammographic evidence of malignancy. A result letter of this
screening mammogram will be mailed directly to the patient.

RECOMMENDATION:
Screening mammogram in one year. (Code:AS-G-LCT)

BI-RADS CATEGORY  1: Negative.

## 2017-04-03 ENCOUNTER — Other Ambulatory Visit: Payer: Self-pay | Admitting: Gastroenterology

## 2017-04-10 DIAGNOSIS — H35033 Hypertensive retinopathy, bilateral: Secondary | ICD-10-CM | POA: Diagnosis not present

## 2017-04-10 DIAGNOSIS — H16223 Keratoconjunctivitis sicca, not specified as Sjogren's, bilateral: Secondary | ICD-10-CM | POA: Diagnosis not present

## 2017-04-10 DIAGNOSIS — H25013 Cortical age-related cataract, bilateral: Secondary | ICD-10-CM | POA: Diagnosis not present

## 2017-04-10 DIAGNOSIS — E119 Type 2 diabetes mellitus without complications: Secondary | ICD-10-CM | POA: Diagnosis not present

## 2017-04-10 DIAGNOSIS — H2513 Age-related nuclear cataract, bilateral: Secondary | ICD-10-CM | POA: Diagnosis not present

## 2017-04-10 DIAGNOSIS — H01003 Unspecified blepharitis right eye, unspecified eyelid: Secondary | ICD-10-CM | POA: Diagnosis not present

## 2017-04-10 DIAGNOSIS — H01009 Unspecified blepharitis unspecified eye, unspecified eyelid: Secondary | ICD-10-CM | POA: Diagnosis not present

## 2017-04-10 DIAGNOSIS — H04123 Dry eye syndrome of bilateral lacrimal glands: Secondary | ICD-10-CM | POA: Diagnosis not present

## 2017-05-06 ENCOUNTER — Other Ambulatory Visit: Payer: Self-pay | Admitting: Gastroenterology

## 2017-05-21 ENCOUNTER — Other Ambulatory Visit: Payer: Self-pay | Admitting: Gastroenterology

## 2017-05-21 ENCOUNTER — Ambulatory Visit (INDEPENDENT_AMBULATORY_CARE_PROVIDER_SITE_OTHER): Payer: Medicare Other | Admitting: Gastroenterology

## 2017-05-21 ENCOUNTER — Encounter: Payer: Self-pay | Admitting: Gastroenterology

## 2017-05-21 VITALS — BP 118/76 | HR 74 | Ht 63.0 in | Wt 156.5 lb

## 2017-05-21 DIAGNOSIS — R197 Diarrhea, unspecified: Secondary | ICD-10-CM

## 2017-05-21 DIAGNOSIS — D3A00 Benign carcinoid tumor of unspecified site: Secondary | ICD-10-CM

## 2017-05-21 MED ORDER — HYOSCYAMINE SULFATE ER 0.375 MG PO TB12: 0.3750 mg | ORAL_TABLET | Freq: Two times a day (BID) | ORAL | 5 refills | Status: DC

## 2017-05-21 NOTE — Patient Instructions (Addendum)
levbid 0.375mg  pill one pill twice daily, disp 60 with 5 refills.  Call in 5-6 weeks to report on your response.  Normal BMI (Body Mass Index- based on height and weight) is between 23 and 30. Your BMI today is Body mass index is 27.72 kg/m. Marland Kitchen Please consider follow up  regarding your BMI with your Primary Care Provider.

## 2017-05-21 NOTE — Progress Notes (Signed)
Review of pertinent gastrointestinal problems: 1. Chronic diarrhea:Colonoscopy Dr. Gala Romney 02/2013: pan diverticulosis, normal TI, biopsies randomly from colon were normal, a few small HPs were removed. 01/2013 celiac panel, TSH were both normal. 2. 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 Gala Romney), clinical stage III (uT2, pN1), status post an endoscopic biopsy 02/04/2013 confirming a duodenal carcinoid tumor, status post a duodenal resection 03/31/2013 Dr. Barry Dienes with no remaining tumor found in the duodenum and a positive portal lymph node; Repeat upper endoscopy/EUS on 08/20/2013 confirming a persistent duodenal nodule with a biopsy confirming a low-grade neuroendocrine carcinoma. EGD 2016 Dr. Ardis Hughs, same; the previously noted duodenal nodule was still present in the duodenum, this is carcinoid and it was probably never removed during her duodenal resection surgery.  Follows with Dr. Julieanne Manson. 3. EUS 10/2014above also noted changes consistent withchronic pancreatitis   HPI: This is a very pleasant 72 year old woman who is here with her daughter today  Tired of having diarrhea.   Sleeps poorly at night but sleeps a lot during the day.  Pan abdoninal pains lower and also along the left side of her abdomen.  Has pains in the left side of her back, low and mid abd.  She takes creon.  Takes 2pills with meals and 1 pill with snack.  One at bedtime.  She has 'bouts of diarrhea' can last days to weeks.  During that time she will take lomotil and gummi fibers.  Sometimes she'll take pepto.  Weight stable.  Can have leakage after loose stools, messing her underpants.  Chief complaint is "abdominal pain, nausea, bloating, gas, diarrhea, constipation, hemorrhoids."  ROS: complete GI ROS as described in HPI, all other review negative.  Constitutional:  No unintentional weight loss   Past Medical History:  Diagnosis Date  .  Allergy   . Anxiety   . Arthritis   . Cancer [facility])    carcinoid tumor stomach  . Cataract   . Depression   . Diabetes mellitus   . Fibromyalgia   . GERD (gastroesophageal reflux disease)   . Headache(784.0)   . Hyperlipidemia   . Hyperplastic colon polyp 02/04/2013  . Hypertension   . IBS (irritable bowel syndrome)   . Osteoporosis   . Polyp of rectum 02/04/2013  . Renal disorder   . Shortness of breath   . Sleep apnea   . Vitamin D deficiency     Past Surgical History:  Procedure Laterality Date  . ABDOMINAL HYSTERECTOMY     Cervical cancer, with incidental appendectomy  . APPENDECTOMY    . BIOPSY N/A 02/04/2013   Procedure: BIOPSY;  Surgeon: Daneil Dolin, MD;  [facility];  Service: Endoscopy;  Laterality: N/A;  SB BX AND RANDOM COLON BX  . bladder tack    . BREAST LUMPECTOMY     benign  . COLONOSCOPY  09/22/2003   RMR: Diminutive rectal polyps cold biopsy/removed.  Otherwise normal rectal mucosa/Pancolonic diverticula.  The remainder of the colonic mucosa appeared normal  . COLONOSCOPY WITH ESOPHAGOGASTRODUODENOSCOPY (EGD) N/A 02/04/2013   Procedure: COLONOSCOPY WITH ESOPHAGOGASTRODUODENOSCOPY (EGD);  Surgeon: Daneil Dolin, MD;  [facility];  Service: Endoscopy;  Laterality: N/A;  10:15-moved to 9:30am  . ESOPHAGOGASTRODUODENOSCOPY  11/19/2006   [id number] Schatzki's ring with overlying distal esophageal erosions consistent with erosive reflux esophagitis, status post dilation and disruption of ring as described above/ Small hiatal hernia/Otherwise normal stomach, first duodenum and second duodenum  . EUS  N/A 02/12/2013   Procedure: UPPER ENDOSCOPIC ULTRASOUND (EUS) LINEAR;  Surgeon: Milus Banister, MD;  [facility];  Service: Endoscopy;  Laterality: N/A;  . EUS N/A 08/20/2013   Procedure: UPPER ENDOSCOPIC ULTRASOUND (EUS) LINEAR;  Surgeon: Milus Banister, MD;  [facility];  Service: Endoscopy;  Laterality: N/A;  .  LAPAROSCOPIC SMALL BOWEL RESECTION N/A 03/31/2013   Procedure: LAPAROSCOPIC  ASSISTED CONVERTED TO OPEN RESECTION DUODENAL MASS/UPPER ENDOSCOPY;  Surgeon: Stark Klein, MD;  Location: [facility];  Service: General;  Laterality: N/A;  . TONSILLECTOMY      Current Outpatient Medications  Medication Sig Dispense Refill  . ALPRAZolam (XANAX) 1 MG tablet Take 0.5-1 mg by mouth at bedtime as needed for anxiety.     Marland Kitchen antiseptic oral rinse (BIOTENE) LIQD 15 mLs by Mouth Rinse route as needed for dry mouth.    . Calcium Carbonate-Simethicone (ALKA-SELTZER HEARTBURN + GAS) 750-80 MG CHEW Chew by mouth as needed.    . chlorthalidone (HYGROTON) 25 MG tablet Take 25 mg by mouth daily.    . Cholecalciferol (VITAMIN D PO) Take 5,000 Units by mouth once a week. Mondays    . clonazePAM (KLONOPIN) 1 MG tablet Take 1 mg by mouth 3 (three) times daily.    Marland Kitchen CREON 12000 units CPEP capsule TAKE 2 CAPSULES DAILY WITH EACH MEAL AND 1 CAPSULE WITH A SNACK. 270 capsule 0  . Cyanocobalamin (VITAMIN B-12 PO) Take 1 tablet by mouth daily.    Marland Kitchen dexlansoprazole (DEXILANT) 60 MG capsule Take 1 capsule (60 mg total) by mouth daily. 30 capsule 5  . diphenoxylate-atropine (LOMOTIL) 2.5-0.025 MG tablet TAKE 2 TABLETS BY MOUTH UP TO FOUR TIMES DAILY AS NEEDED. 240 tablet 0  . fexofenadine (ALLEGRA) 30 MG tablet Take 30 mg by mouth 2 (two) times daily.    Marland Kitchen gabapentin (NEURONTIN) 300 MG capsule Take 300 mg by mouth 2 (two) times daily.     Marland Kitchen glipiZIDE (GLUCOTROL) 2.5 mg TABS tablet Take 2.5 mg by mouth daily before breakfast. IF blood sugar drops below 100 for 3 days in a row cut dose in half    . Inulin (FIBER CHOICE FRUITY BITES) 1.5 g CHEW Chew 1 tablet by mouth daily.    Marland Kitchen losartan-hydrochlorothiazide (HYZAAR) 100-25 MG tablet Take 1 tablet by mouth daily.     . Nutritional Supplements (DHEA PO) Take 1 tablet by mouth at bedtime.    Marland Kitchen oxymetazoline (AFRIN) 0.05 % nasal spray Place 1 spray into both nostrils 2 (two) times daily as  needed for congestion.    . Pancrelipase, Lip-Prot-Amyl, 30076-22633 units CPEP Take 8 capsules by mouth as directed. 2 pills with every meal by mouth and 1 pill by mouth with every snack 320 capsule 3  . PEPPERMINT OIL PO Take by mouth daily.    . pravastatin (PRAVACHOL) 10 MG tablet Take 10 mg by mouth daily.    Marland Kitchen Propylene Glycol-Glycerin (SOOTHE OP) Place 1 drop into both eyes 4 (four) times daily.     Marland Kitchen triamcinolone cream (KENALOG) 0.1 % Apply 1 application topically 2 (two) times daily.    . valACYclovir (VALTREX) 500 MG tablet Take 500 mg by mouth as needed. Reported on 10/31/2015    . verapamil (CALAN-SR) 240 MG CR tablet Take 240 mg by mouth every morning.      Current Facility-Administered Medications  Medication Dose Route Frequency Provider Last Rate Last Dose  . 0.9 %  sodium chloride infusion  500 mL Intravenous Continuous Ardis Hughs,  Melene Plan, MD        Allergies as of 05/21/2017 - Review Complete 05/21/2017  Allergen Reaction Noted  . Other  01/28/2013  . Asa [aspirin]  02/06/2013  . Erythromycin  01/28/2013  . Famciclovir  02/11/2013  . Hydrocodone  02/04/2013  . Ibuprofen  02/06/2013  . Lactase Diarrhea 11/30/2013  . Lactose intolerance (gi) Diarrhea and Other (See Comments) 03/31/2013  . Morphine and related Other (See Comments) 09/27/2014  . Oxycodone  01/28/2013  . Promethazine Nausea And Vomiting 01/30/2011  . Tylenol [acetaminophen]  02/06/2013    Family History  Problem Relation Age of Onset  . Cancer Mother        ?ovarian  . Cancer Father   . Colon cancer Neg Hx     Social History   Socioeconomic History  . Marital status: Married    Spouse name: Not on file  . Number of children: 2  . Years of education: Not on file  . Highest education level: Not on file  Social Needs  . Financial resource strain: Not on file  . Food insecurity - worry: Not on file  . Food insecurity - inability: Not on file  . Transportation needs - medical: Not on file  .  Transportation needs - non-medical: Not on file  Occupational History  . Occupation: retired  Tobacco Use  . Smoking status: Former Smoker    Types: Cigarettes  . Smokeless tobacco: Former Systems developer    Quit date: 02/11/1970  Substance and Sexual Activity  . Alcohol use: No  . Drug use: No  . Sexual activity: Not on file  Other Topics Concern  . 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 female with myriad upper and lower GI symptoms as well as back pains, fatigue  She has carcinoid still in situ in her duodenum, followed by Dr. Benay Spice.  I am not convinced that any of her symptoms are from carcinoid currently.  Indeed chromogranin A levels 4 months ago were normal.  Seems her biggest problem is chronic diarrhea and abdominal pains.  This might be somewhat functional.  Perhaps chronic pancreatitis contributes.  She will continue on her pink reticulocyte enzyme supplement with every meal and with snacks.  I am going to start her on twice daily antispasm pill hyoscyamine and she will call to report on her response in 5-6 weeks.  If she is still having significant issues with loose stools then I would likely re-put her on Imodium scheduled every morning in addition to her other medicines.  Please see the "Patient Instructions" section for addition details about the plan.  Owens Loffler, MD [facility] 05/21/2017, 3:43 PM

## 2017-05-22 ENCOUNTER — Other Ambulatory Visit: Payer: Self-pay | Admitting: Gastroenterology

## 2017-05-30 DIAGNOSIS — E1165 Type 2 diabetes mellitus with hyperglycemia: Secondary | ICD-10-CM | POA: Diagnosis not present

## 2017-05-30 DIAGNOSIS — E1149 Type 2 diabetes mellitus with other diabetic neurological complication: Secondary | ICD-10-CM | POA: Diagnosis not present

## 2017-05-30 DIAGNOSIS — E663 Overweight: Secondary | ICD-10-CM | POA: Diagnosis not present

## 2017-05-30 DIAGNOSIS — E7800 Pure hypercholesterolemia, unspecified: Secondary | ICD-10-CM | POA: Diagnosis not present

## 2017-05-30 DIAGNOSIS — Z79899 Other long term (current) drug therapy: Secondary | ICD-10-CM | POA: Diagnosis not present

## 2017-05-30 DIAGNOSIS — M797 Fibromyalgia: Secondary | ICD-10-CM | POA: Diagnosis not present

## 2017-06-07 DIAGNOSIS — H16223 Keratoconjunctivitis sicca, not specified as Sjogren's, bilateral: Secondary | ICD-10-CM | POA: Diagnosis not present

## 2017-06-07 DIAGNOSIS — H01003 Unspecified blepharitis right eye, unspecified eyelid: Secondary | ICD-10-CM | POA: Diagnosis not present

## 2017-06-07 DIAGNOSIS — H01009 Unspecified blepharitis unspecified eye, unspecified eyelid: Secondary | ICD-10-CM | POA: Diagnosis not present

## 2017-06-07 DIAGNOSIS — H04123 Dry eye syndrome of bilateral lacrimal glands: Secondary | ICD-10-CM | POA: Diagnosis not present

## 2017-06-12 DIAGNOSIS — E1165 Type 2 diabetes mellitus with hyperglycemia: Secondary | ICD-10-CM | POA: Diagnosis not present

## 2017-06-12 DIAGNOSIS — E340 Carcinoid syndrome: Secondary | ICD-10-CM | POA: Diagnosis not present

## 2017-06-12 DIAGNOSIS — I10 Essential (primary) hypertension: Secondary | ICD-10-CM | POA: Diagnosis not present

## 2017-06-12 DIAGNOSIS — E1140 Type 2 diabetes mellitus with diabetic neuropathy, unspecified: Secondary | ICD-10-CM | POA: Diagnosis not present

## 2017-06-12 DIAGNOSIS — E7800 Pure hypercholesterolemia, unspecified: Secondary | ICD-10-CM | POA: Diagnosis not present

## 2017-06-12 DIAGNOSIS — R197 Diarrhea, unspecified: Secondary | ICD-10-CM | POA: Diagnosis not present

## 2017-06-14 ENCOUNTER — Ambulatory Visit (HOSPITAL_COMMUNITY): Payer: Medicare Other | Admitting: Psychiatry

## 2017-07-01 DIAGNOSIS — H53483 Generalized contraction of visual field, bilateral: Secondary | ICD-10-CM | POA: Diagnosis not present

## 2017-07-01 DIAGNOSIS — H02014 Cicatricial entropion of left upper eyelid: Secondary | ICD-10-CM | POA: Diagnosis not present

## 2017-07-01 DIAGNOSIS — H02054 Trichiasis without entropian left upper eyelid: Secondary | ICD-10-CM | POA: Diagnosis not present

## 2017-07-01 DIAGNOSIS — H02423 Myogenic ptosis of bilateral eyelids: Secondary | ICD-10-CM | POA: Diagnosis not present

## 2017-07-01 DIAGNOSIS — H02011 Cicatricial entropion of right upper eyelid: Secondary | ICD-10-CM | POA: Diagnosis not present

## 2017-07-01 DIAGNOSIS — H02051 Trichiasis without entropian right upper eyelid: Secondary | ICD-10-CM | POA: Diagnosis not present

## 2017-07-05 DIAGNOSIS — E7800 Pure hypercholesterolemia, unspecified: Secondary | ICD-10-CM | POA: Diagnosis not present

## 2017-07-05 DIAGNOSIS — E1165 Type 2 diabetes mellitus with hyperglycemia: Secondary | ICD-10-CM | POA: Diagnosis not present

## 2017-07-05 DIAGNOSIS — E1149 Type 2 diabetes mellitus with other diabetic neurological complication: Secondary | ICD-10-CM | POA: Diagnosis not present

## 2017-07-05 DIAGNOSIS — M797 Fibromyalgia: Secondary | ICD-10-CM | POA: Diagnosis not present

## 2017-07-16 DIAGNOSIS — M2041 Other hammer toe(s) (acquired), right foot: Secondary | ICD-10-CM | POA: Diagnosis not present

## 2017-07-16 DIAGNOSIS — M79675 Pain in left toe(s): Secondary | ICD-10-CM | POA: Diagnosis not present

## 2017-07-16 DIAGNOSIS — B351 Tinea unguium: Secondary | ICD-10-CM | POA: Diagnosis not present

## 2017-07-16 DIAGNOSIS — E0840 Diabetes mellitus due to underlying condition with diabetic neuropathy, unspecified: Secondary | ICD-10-CM | POA: Diagnosis not present

## 2017-07-16 DIAGNOSIS — L84 Corns and callosities: Secondary | ICD-10-CM | POA: Diagnosis not present

## 2017-08-05 DIAGNOSIS — F329 Major depressive disorder, single episode, unspecified: Secondary | ICD-10-CM | POA: Diagnosis not present

## 2017-08-05 DIAGNOSIS — F419 Anxiety disorder, unspecified: Secondary | ICD-10-CM | POA: Diagnosis not present

## 2017-08-05 DIAGNOSIS — I10 Essential (primary) hypertension: Secondary | ICD-10-CM | POA: Diagnosis not present

## 2017-08-05 DIAGNOSIS — Z789 Other specified health status: Secondary | ICD-10-CM | POA: Diagnosis not present

## 2017-08-06 DIAGNOSIS — E1165 Type 2 diabetes mellitus with hyperglycemia: Secondary | ICD-10-CM | POA: Diagnosis not present

## 2017-08-06 DIAGNOSIS — R1084 Generalized abdominal pain: Secondary | ICD-10-CM | POA: Diagnosis not present

## 2017-08-06 DIAGNOSIS — R109 Unspecified abdominal pain: Secondary | ICD-10-CM | POA: Diagnosis not present

## 2017-08-06 DIAGNOSIS — Z79899 Other long term (current) drug therapy: Secondary | ICD-10-CM | POA: Diagnosis not present

## 2017-08-06 DIAGNOSIS — M4727 Other spondylosis with radiculopathy, lumbosacral region: Secondary | ICD-10-CM | POA: Diagnosis not present

## 2017-08-06 DIAGNOSIS — E1149 Type 2 diabetes mellitus with other diabetic neurological complication: Secondary | ICD-10-CM | POA: Diagnosis not present

## 2017-08-19 DIAGNOSIS — I10 Essential (primary) hypertension: Secondary | ICD-10-CM | POA: Diagnosis not present

## 2017-08-19 DIAGNOSIS — N183 Chronic kidney disease, stage 3 (moderate): Secondary | ICD-10-CM | POA: Diagnosis not present

## 2017-08-19 DIAGNOSIS — E559 Vitamin D deficiency, unspecified: Secondary | ICD-10-CM | POA: Diagnosis not present

## 2017-08-19 DIAGNOSIS — Z79899 Other long term (current) drug therapy: Secondary | ICD-10-CM | POA: Diagnosis not present

## 2017-08-19 DIAGNOSIS — R809 Proteinuria, unspecified: Secondary | ICD-10-CM | POA: Diagnosis not present

## 2017-08-19 DIAGNOSIS — D509 Iron deficiency anemia, unspecified: Secondary | ICD-10-CM | POA: Diagnosis not present

## 2017-08-21 DIAGNOSIS — I10 Essential (primary) hypertension: Secondary | ICD-10-CM | POA: Diagnosis not present

## 2017-08-21 DIAGNOSIS — E559 Vitamin D deficiency, unspecified: Secondary | ICD-10-CM | POA: Diagnosis not present

## 2017-08-21 DIAGNOSIS — E669 Obesity, unspecified: Secondary | ICD-10-CM | POA: Diagnosis not present

## 2017-08-21 DIAGNOSIS — N181 Chronic kidney disease, stage 1: Secondary | ICD-10-CM | POA: Diagnosis not present

## 2017-08-22 ENCOUNTER — Telehealth: Payer: Self-pay | Admitting: Gastroenterology

## 2017-08-22 NOTE — Telephone Encounter (Signed)
Pt has questions regarding medications that she was put on the last time she was seen.

## 2017-08-26 NOTE — Telephone Encounter (Signed)
The pt wants to notify Dr Ardis Hughs that she is doing well on levbid.

## 2017-08-28 ENCOUNTER — Other Ambulatory Visit: Payer: Self-pay | Admitting: Gastroenterology

## 2017-08-29 DIAGNOSIS — E1165 Type 2 diabetes mellitus with hyperglycemia: Secondary | ICD-10-CM | POA: Diagnosis not present

## 2017-08-29 DIAGNOSIS — K760 Fatty (change of) liver, not elsewhere classified: Secondary | ICD-10-CM | POA: Diagnosis not present

## 2017-08-29 DIAGNOSIS — R109 Unspecified abdominal pain: Secondary | ICD-10-CM | POA: Diagnosis not present

## 2017-08-29 DIAGNOSIS — E1149 Type 2 diabetes mellitus with other diabetic neurological complication: Secondary | ICD-10-CM | POA: Diagnosis not present

## 2017-08-29 DIAGNOSIS — K8020 Calculus of gallbladder without cholecystitis without obstruction: Secondary | ICD-10-CM | POA: Diagnosis not present

## 2017-08-29 DIAGNOSIS — R1084 Generalized abdominal pain: Secondary | ICD-10-CM | POA: Diagnosis not present

## 2017-08-30 ENCOUNTER — Other Ambulatory Visit: Payer: Self-pay | Admitting: Gastroenterology

## 2017-09-05 DIAGNOSIS — Z79899 Other long term (current) drug therapy: Secondary | ICD-10-CM | POA: Diagnosis not present

## 2017-09-05 DIAGNOSIS — E1165 Type 2 diabetes mellitus with hyperglycemia: Secondary | ICD-10-CM | POA: Diagnosis not present

## 2017-09-05 DIAGNOSIS — R1084 Generalized abdominal pain: Secondary | ICD-10-CM | POA: Diagnosis not present

## 2017-09-05 DIAGNOSIS — E663 Overweight: Secondary | ICD-10-CM | POA: Diagnosis not present

## 2017-09-05 DIAGNOSIS — I10 Essential (primary) hypertension: Secondary | ICD-10-CM | POA: Diagnosis not present

## 2017-09-05 DIAGNOSIS — E7800 Pure hypercholesterolemia, unspecified: Secondary | ICD-10-CM | POA: Diagnosis not present

## 2017-09-05 DIAGNOSIS — E1149 Type 2 diabetes mellitus with other diabetic neurological complication: Secondary | ICD-10-CM | POA: Diagnosis not present

## 2017-09-06 DIAGNOSIS — F419 Anxiety disorder, unspecified: Secondary | ICD-10-CM | POA: Diagnosis not present

## 2017-09-06 DIAGNOSIS — F329 Major depressive disorder, single episode, unspecified: Secondary | ICD-10-CM | POA: Diagnosis not present

## 2017-09-09 DIAGNOSIS — H01009 Unspecified blepharitis unspecified eye, unspecified eyelid: Secondary | ICD-10-CM | POA: Diagnosis not present

## 2017-09-09 DIAGNOSIS — H04123 Dry eye syndrome of bilateral lacrimal glands: Secondary | ICD-10-CM | POA: Diagnosis not present

## 2017-09-09 DIAGNOSIS — H01003 Unspecified blepharitis right eye, unspecified eyelid: Secondary | ICD-10-CM | POA: Diagnosis not present

## 2017-09-09 DIAGNOSIS — H16223 Keratoconjunctivitis sicca, not specified as Sjogren's, bilateral: Secondary | ICD-10-CM | POA: Diagnosis not present

## 2017-09-10 DIAGNOSIS — M797 Fibromyalgia: Secondary | ICD-10-CM | POA: Diagnosis not present

## 2017-09-10 DIAGNOSIS — E1165 Type 2 diabetes mellitus with hyperglycemia: Secondary | ICD-10-CM | POA: Diagnosis not present

## 2017-09-10 DIAGNOSIS — I10 Essential (primary) hypertension: Secondary | ICD-10-CM | POA: Diagnosis not present

## 2017-09-10 DIAGNOSIS — E1149 Type 2 diabetes mellitus with other diabetic neurological complication: Secondary | ICD-10-CM | POA: Diagnosis not present

## 2017-09-12 ENCOUNTER — Inpatient Hospital Stay: Payer: Medicare Other

## 2017-09-12 ENCOUNTER — Inpatient Hospital Stay: Payer: Medicare Other | Attending: Oncology | Admitting: Oncology

## 2017-09-12 ENCOUNTER — Telehealth: Payer: Self-pay | Admitting: Oncology

## 2017-09-12 ENCOUNTER — Other Ambulatory Visit: Payer: Medicare Other

## 2017-09-12 VITALS — BP 165/76 | HR 80 | Temp 99.0°F | Resp 18 | Ht 63.0 in | Wt 157.3 lb

## 2017-09-12 DIAGNOSIS — C7A010 Malignant carcinoid tumor of the duodenum: Secondary | ICD-10-CM | POA: Diagnosis not present

## 2017-09-12 DIAGNOSIS — R197 Diarrhea, unspecified: Secondary | ICD-10-CM | POA: Diagnosis not present

## 2017-09-12 DIAGNOSIS — M797 Fibromyalgia: Secondary | ICD-10-CM | POA: Diagnosis not present

## 2017-09-12 DIAGNOSIS — D3A098 Benign carcinoid tumors of other sites: Secondary | ICD-10-CM

## 2017-09-12 NOTE — Telephone Encounter (Signed)
Scheduled appt per 5/9 los- sent reminder letter in the mail with appt date and time.

## 2017-09-12 NOTE — Progress Notes (Signed)
  [facility] OFFICE PROGRESS NOTE   Diagnosis: Carcinoid tumor  INTERVAL HISTORY:   Tiffany Ortiz returns for a scheduled visit.  She has multiple complaints including pain at the abdomen, left back, nausea, and hot flashes.  She reports diarrhea resolved when she was started on high Cosamin by Dr. Ardis Hughs. She is followed for primary care at [facility].  She did not stop Dexilant prior to the blood draw today. Objective:  Vital signs in last 24 hours:  There were no vitals taken for this visit.    HEENT: Neck without mass Lymphatics: No cervical, supraclavicular, axillary, or inguinal nodes Resp: Lungs clear bilaterally Cardio: Regular rate and rhythm GI: No hepatosplenomegaly, tender at the subcostal region bilaterally, no mass Vascular: No leg edema    Lab Results: Chromogranin A on 12/10/2016-5   Medications: I have reviewed the patient's current medications.   Assessment/Plan: 1. 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, clinical stage III (uT2, pN1), status post an endoscopic biopsy 02/04/2013 confirming a duodenal carcinoid tumor, status post a duodenal resection 03/31/2013 with no remaining tumor found in the duodenum and a positive portal lymph node   Repeat upper endoscopy/EUS on 08/20/2013 confirming a persistent duodenal nodule with a biopsy confirming a low-grade neuroendocrine carcinoma  Repeat upper endoscopy 09/27/2014 with a duodenal nodule proximal to a surgical scar, biopsy confirmed a well-differentiated neuroendocrine tumor 2. Diarrhea-intermittent-improved with high Cosamin 3. Diabetes  4. Sleep apnea  5. Fibromyalgia  6. Hypertension  7. Gastroesophageal reflux disease  8. Status post dilatation of a Schatzki's ring 02/04/2013  9. Vitiligo and hyperpigmented skin rash-evaluated by dermatology    Disposition: Tiffany Ortiz appears stable.  I doubt her symptoms are related to  carcinoid disease.  I suspect the GI symptoms may be related to chronic pancreatitis or fibromyalgia.  We will follow-up on the chromogranin A level from today.  She will return for an office visit and chromogranin A level in 9 months.  I am available to see her sooner as needed.  15 minutes were spent with the patient today.  The majority of time was used for counseling and coordination of care.  Betsy Coder, MD  09/12/2017  10:40 AM

## 2017-09-16 ENCOUNTER — Telehealth: Payer: Self-pay | Admitting: Emergency Medicine

## 2017-09-16 LAB — CHROMOGRANIN A: CHROMOGRANIN A: 13 nmol/L — AB (ref 0–5)

## 2017-09-16 NOTE — Telephone Encounter (Signed)
-----   Message from Ladell Pier, MD sent at 09/16/2017  4:08 PM EDT ----- Please call patient, the chromogranin A level is mildly elevated, has been higher in the past, follow-up as scheduled, call for increased diarrhea or new symptoms

## 2017-09-17 NOTE — Telephone Encounter (Signed)
Notified pt of chromagranin result. She voiced understanding.

## 2017-10-07 DIAGNOSIS — E1165 Type 2 diabetes mellitus with hyperglycemia: Secondary | ICD-10-CM | POA: Diagnosis not present

## 2017-10-07 DIAGNOSIS — Z23 Encounter for immunization: Secondary | ICD-10-CM | POA: Diagnosis not present

## 2017-10-07 DIAGNOSIS — E1149 Type 2 diabetes mellitus with other diabetic neurological complication: Secondary | ICD-10-CM | POA: Diagnosis not present

## 2017-10-07 DIAGNOSIS — Z0000 Encounter for general adult medical examination without abnormal findings: Secondary | ICD-10-CM | POA: Diagnosis not present

## 2017-10-07 DIAGNOSIS — K219 Gastro-esophageal reflux disease without esophagitis: Secondary | ICD-10-CM | POA: Diagnosis not present

## 2017-10-07 DIAGNOSIS — I10 Essential (primary) hypertension: Secondary | ICD-10-CM | POA: Diagnosis not present

## 2017-10-07 DIAGNOSIS — Z79899 Other long term (current) drug therapy: Secondary | ICD-10-CM | POA: Diagnosis not present

## 2017-10-15 ENCOUNTER — Ambulatory Visit (INDEPENDENT_AMBULATORY_CARE_PROVIDER_SITE_OTHER): Payer: Medicare Other | Admitting: Gastroenterology

## 2017-10-15 ENCOUNTER — Encounter: Payer: Self-pay | Admitting: Gastroenterology

## 2017-10-15 VITALS — BP 116/72 | HR 84 | Ht 61.5 in | Wt 157.5 lb

## 2017-10-15 DIAGNOSIS — R1310 Dysphagia, unspecified: Secondary | ICD-10-CM

## 2017-10-15 NOTE — Progress Notes (Signed)
Review of pertinent gastrointestinal problems: 1. Chronic diarrhea:Colonoscopy Dr. Gala Romney 02/2013: pan diverticulosis, normal TI, biopsies randomly from colon were normal, a few small HPs were removed. 01/2013 celiac panel, TSH were both normal.  2018 dicyclomine works very well for her diarrhea 2. 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 Gala Romney), clinical stage III (uT2, pN1), status post an endoscopic biopsy 02/04/2013 confirming a duodenal carcinoid tumor, status post a duodenal resection 03/31/2013 Dr. Barry Dienes with no remaining tumor found in the duodenum and a positive portal lymph node; Repeat upper endoscopy/EUS on 08/20/2013 confirming a persistent duodenal nodule with a biopsy confirming a low-grade neuroendocrine carcinoma. EGD 2016 Dr. Ardis Hughs, same; the previously noted duodenal nodule was still present in the duodenum, this is carcinoid and it was probably never removed during her duodenal resection surgery. EGD Dr. Ardis Hughs 04/2016: same nodule (path showed carcinoid again).  Also benign appearing GE junction stricture was dilated to 20 mm  Follows with Dr. Julieanne Manson. 3. EUS 10/2014above also noted changes consistent withchronic pancreatitis 4.  Dysphasia, benign GE junction stricture.  Dilated 12 2017 to 20 mm with balloon.    HPI: This is a pleasant 72 year old woman whom I last saw about 5 months ago here in the office.  She is here again with her daughter I believe.  Chief complaint is dysphasia   Her weight is up 1 pound since last visit here 5 months ago (same scale).  She takes the dicyclomine usually once a day.  This works very well for her  She has had dysphagia for a long time.  Solid food seems to hang in her mid esophagus. The back of her throat feels sore. This occurs daily.  This swallowing has been a problem for years.  Starting having a cough with olmesartin.    ROS: complete GI ROS as described in  HPI, all other review negative.  Constitutional:  No unintentional weight loss   Past Medical History:  Diagnosis Date  . Allergy   . Anxiety   . Arthritis   . Cancer [facility])    carcinoid tumor stomach  . Cataract   . Depression   . Diabetes mellitus   . Fibromyalgia   . GERD (gastroesophageal reflux disease)   . Headache(784.0)   . Hyperlipidemia   . Hyperplastic colon polyp 02/04/2013  . Hypertension   . IBS (irritable bowel syndrome)   . Osteoporosis   . Polyp of rectum 02/04/2013  . Renal disorder   . Shortness of breath   . Sleep apnea   . Vitamin D deficiency     Past Surgical History:  Procedure Laterality Date  . ABDOMINAL HYSTERECTOMY     Cervical cancer, with incidental appendectomy  . APPENDECTOMY    . BIOPSY N/A 02/04/2013   Procedure: BIOPSY;  Surgeon: Daneil Dolin, MD;  [facility];  Service: Endoscopy;  Laterality: N/A;  SB BX AND RANDOM COLON BX  . bladder tack    . BREAST LUMPECTOMY     benign  . COLONOSCOPY  09/22/2003   RMR: Diminutive rectal polyps cold biopsy/removed.  Otherwise normal rectal mucosa/Pancolonic diverticula.  The remainder of the colonic mucosa appeared normal  . COLONOSCOPY WITH ESOPHAGOGASTRODUODENOSCOPY (EGD) N/A 02/04/2013   Procedure: COLONOSCOPY WITH ESOPHAGOGASTRODUODENOSCOPY (EGD);  Surgeon: Daneil Dolin, MD;  [facility];  Service: Endoscopy;  Laterality: N/A;  10:15-moved to 9:30am  . ESOPHAGOGASTRODUODENOSCOPY  11/19/2006   [id number] Schatzki's ring with overlying  distal esophageal erosions consistent with erosive reflux esophagitis, status post dilation and disruption of ring as described above/ Small hiatal hernia/Otherwise normal stomach, first duodenum and second duodenum  . EUS N/A 02/12/2013   Procedure: UPPER ENDOSCOPIC ULTRASOUND (EUS) LINEAR;  Surgeon: Milus Banister, MD;  [facility];  Service: Endoscopy;  Laterality: N/A;  . EUS N/A 08/20/2013   Procedure: UPPER ENDOSCOPIC  ULTRASOUND (EUS) LINEAR;  Surgeon: Milus Banister, MD;  [facility];  Service: Endoscopy;  Laterality: N/A;  . LAPAROSCOPIC SMALL BOWEL RESECTION N/A 03/31/2013   Procedure: LAPAROSCOPIC  ASSISTED CONVERTED TO OPEN RESECTION DUODENAL MASS/UPPER ENDOSCOPY;  Surgeon: Stark Klein, MD;  [facility];  Service: General;  Laterality: N/A;  . TONSILLECTOMY      Current Outpatient Medications  Medication Sig Dispense Refill  . amLODipine (NORVASC) 5 MG tablet Take 5 mg by mouth daily.    Marland Kitchen antiseptic oral rinse (BIOTENE) LIQD 15 mLs by Mouth Rinse route as needed for dry mouth.    . chlorthalidone (HYGROTON) 25 MG tablet Take 25 mg by mouth daily.    . Cholecalciferol (VITAMIN D PO) Take 5,000 Units by mouth once a week. Mondays    . clonazePAM (KLONOPIN) 1 MG tablet Take 1 mg by mouth 3 (three) times daily.    . Coenzyme Q10 (CO Q-10 PO) Take 1 tablet by mouth daily.    Marland Kitchen CREON 12000 units CPEP capsule TAKE 2 CAPSULES DAILY WITH EACH MEAL AND 1 CAPSULE WITH A SNACK. 240 capsule 0  . Cyanocobalamin (VITAMIN B-12 PO) Take 1 tablet by mouth daily.    Marland Kitchen dexlansoprazole (DEXILANT) 60 MG capsule Take 1 capsule (60 mg total) by mouth daily. 30 capsule 5  . fexofenadine (ALLEGRA) 30 MG tablet Take 30 mg by mouth 2 (two) times daily.    Marland Kitchen gabapentin (NEURONTIN) 300 MG capsule Take 300 mg by mouth 2 (two) times daily.     Marland Kitchen glipiZIDE (GLUCOTROL) 2.5 mg TABS tablet Take 2.5 mg by mouth daily before breakfast. IF blood sugar drops below 100 for 3 days in a row cut dose in half    . Inulin (FIBER CHOICE FRUITY BITES) 1.5 g CHEW Chew 1 tablet by mouth daily.    Marland Kitchen loperamide (IMODIUM A-D) 2 MG tablet Take 2 mg by mouth as needed for diarrhea or loose stools.    Marland Kitchen losartan-hydrochlorothiazide (HYZAAR) 100-25 MG tablet Take 1 tablet by mouth daily.     . Multiple Vitamin (MULTIVITAMIN) tablet Take 1 tablet by mouth daily. With Vit D    . Nutritional Supplements (DHEA PO) Take 1 tablet by mouth at  bedtime.    . Omega-3 Fatty Acids (OMEGA 3 PO) Take 1 capsule by mouth daily. With Vit D    . oxymetazoline (AFRIN) 0.05 % nasal spray Place 1 spray into both nostrils 2 (two) times daily as needed for congestion.    . pravastatin (PRAVACHOL) 10 MG tablet Take 10 mg by mouth every other day.     . Probiotic Product (PROBIOTIC PO) Take 1 tablet by mouth daily.    Marland Kitchen Propylene Glycol-Glycerin (SOOTHE OP) Place 1 drop into both eyes 4 (four) times daily.     . sertraline (ZOLOFT) 25 MG tablet Take 25 mg by mouth daily.    Marland Kitchen triamcinolone cream (KENALOG) 0.1 % Apply 1 application topically 2 (two) times daily.    . valACYclovir (VALTREX) 500 MG tablet Take 500 mg by mouth as needed. Reported on 10/31/2015    .  hyoscyamine (LEVBID) 0.375 MG 12 hr tablet Take 1 tablet (0.375 mg total) by mouth 2 (two) times daily. (Patient taking differently: Take 0.375 mg by mouth as needed. ) 60 tablet 5   Current Facility-Administered Medications  Medication Dose Route Frequency Provider Last Rate Last Dose  . 0.9 %  sodium chloride infusion  500 mL Intravenous Continuous Milus Banister, MD        Allergies as of 10/15/2017 - Review Complete 10/15/2017  Allergen Reaction Noted  . Other  01/28/2013  . Asa [aspirin]  02/06/2013  . Erythromycin  01/28/2013  . Famciclovir  02/11/2013  . Hydrocodone  02/04/2013  . Ibuprofen  02/06/2013  . Lactase Diarrhea 11/30/2013  . Lactose intolerance (gi) Diarrhea and Other (See Comments) 03/31/2013  . Morphine and related Other (See Comments) 09/27/2014  . Oxycodone  01/28/2013  . Promethazine Nausea And Vomiting 01/30/2011  . Tylenol [acetaminophen]  02/06/2013    Family History  Problem Relation Age of Onset  . Cancer Mother        ?ovarian  . Cancer Father   . Colon cancer Neg Hx     Social History   Socioeconomic History  . Marital status: Married    Spouse name: Not on file  . Number of children: 2  . Years of education: Not on file  . Highest  education level: Not on file  Occupational History  . Occupation: retired  Scientific laboratory technician  . Financial resource strain: Not on file  . Food insecurity:    Worry: Not on file    Inability: Not on file  . Transportation needs:    Medical: Not on file    Non-medical: Not on file  Tobacco Use  . Smoking status: Former Smoker    Types: Cigarettes  . Smokeless tobacco: Former Systems developer    Quit date: 02/11/1970  Substance and Sexual Activity  . Alcohol use: No  . Drug use: No  . Sexual activity: Not on file  Lifestyle  . Physical activity:    Days per week: Not on file    Minutes per session: Not on file  . Stress: Not on file  Relationships  . Social connections:    Talks on phone: Not on file    Gets together: Not on file    Attends religious service: Not on file    Active member of club or organization: Not on file    Attends meetings of clubs or organizations: Not on file    Relationship status: Not on file  . Intimate partner violence:    Fear of current or ex partner: Not on file    Emotionally abused: Not on file    Physically abused: Not on file    Forced sexual activity: Not on file  Other Topics Concern  . 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 female with recurrent dysphasia, history of benign distal  esophagus stricture   for repeat EGD with dilation at her soonest convenience.  I see no reason for any further blood  tests or imaging studies prior to then.  Please see the "Patient Instructions" section for addition details about the plan.  Owens Loffler, MD [facility] 10/15/2017, 12:38 PM

## 2017-10-15 NOTE — Patient Instructions (Addendum)
You will be set up for an upper endoscopy with dilation for dysphagia, esophageal stricture.  Normal BMI (Body Mass Index- based on height and weight) is between 23 and 30. Your BMI today is Body mass index is 29.28 kg/m. Tiffany Ortiz Please consider follow up  regarding your BMI with your Primary Care Provider.

## 2017-10-24 DIAGNOSIS — H2513 Age-related nuclear cataract, bilateral: Secondary | ICD-10-CM | POA: Diagnosis not present

## 2017-10-24 DIAGNOSIS — E119 Type 2 diabetes mellitus without complications: Secondary | ICD-10-CM | POA: Diagnosis not present

## 2017-10-24 DIAGNOSIS — H25013 Cortical age-related cataract, bilateral: Secondary | ICD-10-CM | POA: Diagnosis not present

## 2017-10-24 DIAGNOSIS — H2512 Age-related nuclear cataract, left eye: Secondary | ICD-10-CM | POA: Diagnosis not present

## 2017-10-24 DIAGNOSIS — H35033 Hypertensive retinopathy, bilateral: Secondary | ICD-10-CM | POA: Diagnosis not present

## 2017-10-30 DIAGNOSIS — Z789 Other specified health status: Secondary | ICD-10-CM | POA: Diagnosis not present

## 2017-10-30 DIAGNOSIS — F329 Major depressive disorder, single episode, unspecified: Secondary | ICD-10-CM | POA: Diagnosis not present

## 2017-10-30 DIAGNOSIS — E7800 Pure hypercholesterolemia, unspecified: Secondary | ICD-10-CM | POA: Diagnosis not present

## 2017-10-30 DIAGNOSIS — F419 Anxiety disorder, unspecified: Secondary | ICD-10-CM | POA: Diagnosis not present

## 2017-11-04 DIAGNOSIS — Z79899 Other long term (current) drug therapy: Secondary | ICD-10-CM | POA: Diagnosis not present

## 2017-11-04 DIAGNOSIS — E1165 Type 2 diabetes mellitus with hyperglycemia: Secondary | ICD-10-CM | POA: Diagnosis not present

## 2017-11-04 DIAGNOSIS — E1149 Type 2 diabetes mellitus with other diabetic neurological complication: Secondary | ICD-10-CM | POA: Diagnosis not present

## 2017-11-04 DIAGNOSIS — I10 Essential (primary) hypertension: Secondary | ICD-10-CM | POA: Diagnosis not present

## 2017-11-04 DIAGNOSIS — K219 Gastro-esophageal reflux disease without esophagitis: Secondary | ICD-10-CM | POA: Diagnosis not present

## 2017-11-06 DIAGNOSIS — F419 Anxiety disorder, unspecified: Secondary | ICD-10-CM | POA: Diagnosis not present

## 2017-11-06 DIAGNOSIS — G4700 Insomnia, unspecified: Secondary | ICD-10-CM | POA: Diagnosis not present

## 2017-11-06 DIAGNOSIS — F329 Major depressive disorder, single episode, unspecified: Secondary | ICD-10-CM | POA: Diagnosis not present

## 2017-11-19 DIAGNOSIS — E7800 Pure hypercholesterolemia, unspecified: Secondary | ICD-10-CM | POA: Diagnosis not present

## 2017-11-19 DIAGNOSIS — I10 Essential (primary) hypertension: Secondary | ICD-10-CM | POA: Diagnosis not present

## 2017-11-19 DIAGNOSIS — H25812 Combined forms of age-related cataract, left eye: Secondary | ICD-10-CM | POA: Diagnosis not present

## 2017-11-19 DIAGNOSIS — Z789 Other specified health status: Secondary | ICD-10-CM | POA: Diagnosis not present

## 2017-11-19 DIAGNOSIS — H2512 Age-related nuclear cataract, left eye: Secondary | ICD-10-CM | POA: Diagnosis not present

## 2017-12-02 DIAGNOSIS — L84 Corns and callosities: Secondary | ICD-10-CM | POA: Diagnosis not present

## 2017-12-02 DIAGNOSIS — E0840 Diabetes mellitus due to underlying condition with diabetic neuropathy, unspecified: Secondary | ICD-10-CM | POA: Diagnosis not present

## 2017-12-02 DIAGNOSIS — M19071 Primary osteoarthritis, right ankle and foot: Secondary | ICD-10-CM | POA: Diagnosis not present

## 2017-12-02 DIAGNOSIS — B351 Tinea unguium: Secondary | ICD-10-CM | POA: Diagnosis not present

## 2017-12-02 DIAGNOSIS — M2041 Other hammer toe(s) (acquired), right foot: Secondary | ICD-10-CM | POA: Diagnosis not present

## 2017-12-02 DIAGNOSIS — M79675 Pain in left toe(s): Secondary | ICD-10-CM | POA: Diagnosis not present

## 2017-12-02 DIAGNOSIS — M19072 Primary osteoarthritis, left ankle and foot: Secondary | ICD-10-CM | POA: Diagnosis not present

## 2017-12-05 DIAGNOSIS — G4700 Insomnia, unspecified: Secondary | ICD-10-CM | POA: Diagnosis not present

## 2017-12-05 DIAGNOSIS — F329 Major depressive disorder, single episode, unspecified: Secondary | ICD-10-CM | POA: Diagnosis not present

## 2017-12-05 DIAGNOSIS — F419 Anxiety disorder, unspecified: Secondary | ICD-10-CM | POA: Diagnosis not present

## 2017-12-05 DIAGNOSIS — Z79899 Other long term (current) drug therapy: Secondary | ICD-10-CM | POA: Diagnosis not present

## 2017-12-08 DIAGNOSIS — E1149 Type 2 diabetes mellitus with other diabetic neurological complication: Secondary | ICD-10-CM | POA: Diagnosis not present

## 2017-12-08 DIAGNOSIS — I10 Essential (primary) hypertension: Secondary | ICD-10-CM | POA: Diagnosis not present

## 2017-12-08 DIAGNOSIS — E7800 Pure hypercholesterolemia, unspecified: Secondary | ICD-10-CM | POA: Diagnosis not present

## 2017-12-08 DIAGNOSIS — E1165 Type 2 diabetes mellitus with hyperglycemia: Secondary | ICD-10-CM | POA: Diagnosis not present

## 2017-12-10 DIAGNOSIS — Z79899 Other long term (current) drug therapy: Secondary | ICD-10-CM | POA: Diagnosis not present

## 2017-12-10 DIAGNOSIS — Z6828 Body mass index (BMI) 28.0-28.9, adult: Secondary | ICD-10-CM | POA: Diagnosis not present

## 2017-12-10 DIAGNOSIS — E1165 Type 2 diabetes mellitus with hyperglycemia: Secondary | ICD-10-CM | POA: Diagnosis not present

## 2017-12-10 DIAGNOSIS — E7800 Pure hypercholesterolemia, unspecified: Secondary | ICD-10-CM | POA: Diagnosis not present

## 2017-12-11 DIAGNOSIS — H25011 Cortical age-related cataract, right eye: Secondary | ICD-10-CM | POA: Diagnosis not present

## 2017-12-11 DIAGNOSIS — H2511 Age-related nuclear cataract, right eye: Secondary | ICD-10-CM | POA: Diagnosis not present

## 2017-12-17 DIAGNOSIS — Z9841 Cataract extraction status, right eye: Secondary | ICD-10-CM | POA: Diagnosis not present

## 2017-12-17 DIAGNOSIS — H2511 Age-related nuclear cataract, right eye: Secondary | ICD-10-CM | POA: Diagnosis not present

## 2017-12-17 DIAGNOSIS — H25811 Combined forms of age-related cataract, right eye: Secondary | ICD-10-CM | POA: Diagnosis not present

## 2017-12-27 ENCOUNTER — Encounter: Payer: No Typology Code available for payment source | Admitting: Gastroenterology

## 2017-12-27 DIAGNOSIS — I10 Essential (primary) hypertension: Secondary | ICD-10-CM | POA: Diagnosis not present

## 2017-12-27 DIAGNOSIS — E1149 Type 2 diabetes mellitus with other diabetic neurological complication: Secondary | ICD-10-CM | POA: Diagnosis not present

## 2017-12-27 DIAGNOSIS — K219 Gastro-esophageal reflux disease without esophagitis: Secondary | ICD-10-CM | POA: Diagnosis not present

## 2017-12-27 DIAGNOSIS — C7A010 Malignant carcinoid tumor of the duodenum: Secondary | ICD-10-CM | POA: Diagnosis not present

## 2018-01-07 DIAGNOSIS — E1149 Type 2 diabetes mellitus with other diabetic neurological complication: Secondary | ICD-10-CM | POA: Diagnosis not present

## 2018-01-07 DIAGNOSIS — I10 Essential (primary) hypertension: Secondary | ICD-10-CM | POA: Diagnosis not present

## 2018-01-07 DIAGNOSIS — Z789 Other specified health status: Secondary | ICD-10-CM | POA: Diagnosis not present

## 2018-01-07 DIAGNOSIS — E1165 Type 2 diabetes mellitus with hyperglycemia: Secondary | ICD-10-CM | POA: Diagnosis not present

## 2018-01-08 DIAGNOSIS — G4700 Insomnia, unspecified: Secondary | ICD-10-CM | POA: Diagnosis not present

## 2018-01-08 DIAGNOSIS — F419 Anxiety disorder, unspecified: Secondary | ICD-10-CM | POA: Diagnosis not present

## 2018-01-08 DIAGNOSIS — F329 Major depressive disorder, single episode, unspecified: Secondary | ICD-10-CM | POA: Diagnosis not present

## 2018-01-08 DIAGNOSIS — Z79899 Other long term (current) drug therapy: Secondary | ICD-10-CM | POA: Diagnosis not present

## 2018-01-09 ENCOUNTER — Other Ambulatory Visit: Payer: Self-pay | Admitting: Gastroenterology

## 2018-01-23 ENCOUNTER — Telehealth: Payer: Self-pay | Admitting: Gastroenterology

## 2018-01-23 NOTE — Telephone Encounter (Signed)
Sorry to hear about all that.  Hopefully she will reschedule.

## 2018-01-23 NOTE — Telephone Encounter (Signed)
Receive a call from patient verbalize she was instructed today that she could not eat today. Patient verbalize she has had a lot of deaths in the family in the past months. Verbalize her oldest granddaughter who was 59 was killed in a car accident last week. Verbalize she is grief stricken and is not up to having the procedure on tomorrow. Verbalize she will call back next week and reschedule. Advised I will cancel her appointment and send message to her doctor.

## 2018-01-24 ENCOUNTER — Encounter: Payer: No Typology Code available for payment source | Admitting: Gastroenterology

## 2018-02-06 DIAGNOSIS — Z13220 Encounter for screening for lipoid disorders: Secondary | ICD-10-CM | POA: Diagnosis not present

## 2018-02-06 DIAGNOSIS — Z79899 Other long term (current) drug therapy: Secondary | ICD-10-CM | POA: Diagnosis not present

## 2018-02-06 DIAGNOSIS — R5383 Other fatigue: Secondary | ICD-10-CM | POA: Diagnosis not present

## 2018-02-06 DIAGNOSIS — F419 Anxiety disorder, unspecified: Secondary | ICD-10-CM | POA: Diagnosis not present

## 2018-02-06 DIAGNOSIS — E1149 Type 2 diabetes mellitus with other diabetic neurological complication: Secondary | ICD-10-CM | POA: Diagnosis not present

## 2018-02-06 DIAGNOSIS — F329 Major depressive disorder, single episode, unspecified: Secondary | ICD-10-CM | POA: Diagnosis not present

## 2018-02-06 DIAGNOSIS — G4700 Insomnia, unspecified: Secondary | ICD-10-CM | POA: Diagnosis not present

## 2018-02-06 DIAGNOSIS — Z131 Encounter for screening for diabetes mellitus: Secondary | ICD-10-CM | POA: Diagnosis not present

## 2018-02-17 DIAGNOSIS — M19072 Primary osteoarthritis, left ankle and foot: Secondary | ICD-10-CM | POA: Diagnosis not present

## 2018-02-17 DIAGNOSIS — M79675 Pain in left toe(s): Secondary | ICD-10-CM | POA: Diagnosis not present

## 2018-02-17 DIAGNOSIS — B351 Tinea unguium: Secondary | ICD-10-CM | POA: Diagnosis not present

## 2018-02-17 DIAGNOSIS — M19071 Primary osteoarthritis, right ankle and foot: Secondary | ICD-10-CM | POA: Diagnosis not present

## 2018-02-17 DIAGNOSIS — E0840 Diabetes mellitus due to underlying condition with diabetic neuropathy, unspecified: Secondary | ICD-10-CM | POA: Diagnosis not present

## 2018-02-17 DIAGNOSIS — M2041 Other hammer toe(s) (acquired), right foot: Secondary | ICD-10-CM | POA: Diagnosis not present

## 2018-02-17 DIAGNOSIS — M2011 Hallux valgus (acquired), right foot: Secondary | ICD-10-CM | POA: Diagnosis not present

## 2018-02-17 DIAGNOSIS — L84 Corns and callosities: Secondary | ICD-10-CM | POA: Diagnosis not present

## 2018-02-21 ENCOUNTER — Other Ambulatory Visit: Payer: Self-pay | Admitting: Gastroenterology

## 2018-02-24 DIAGNOSIS — E7800 Pure hypercholesterolemia, unspecified: Secondary | ICD-10-CM | POA: Diagnosis not present

## 2018-02-24 DIAGNOSIS — I10 Essential (primary) hypertension: Secondary | ICD-10-CM | POA: Diagnosis not present

## 2018-02-24 DIAGNOSIS — Z789 Other specified health status: Secondary | ICD-10-CM | POA: Diagnosis not present

## 2018-02-25 DIAGNOSIS — M2041 Other hammer toe(s) (acquired), right foot: Secondary | ICD-10-CM | POA: Diagnosis not present

## 2018-02-25 DIAGNOSIS — M19071 Primary osteoarthritis, right ankle and foot: Secondary | ICD-10-CM | POA: Diagnosis not present

## 2018-02-25 DIAGNOSIS — E1140 Type 2 diabetes mellitus with diabetic neuropathy, unspecified: Secondary | ICD-10-CM | POA: Diagnosis not present

## 2018-02-25 DIAGNOSIS — E0840 Diabetes mellitus due to underlying condition with diabetic neuropathy, unspecified: Secondary | ICD-10-CM | POA: Diagnosis not present

## 2018-02-25 DIAGNOSIS — M19072 Primary osteoarthritis, left ankle and foot: Secondary | ICD-10-CM | POA: Diagnosis not present

## 2018-02-27 DIAGNOSIS — E1140 Type 2 diabetes mellitus with diabetic neuropathy, unspecified: Secondary | ICD-10-CM | POA: Diagnosis not present

## 2018-02-27 DIAGNOSIS — M19072 Primary osteoarthritis, left ankle and foot: Secondary | ICD-10-CM | POA: Diagnosis not present

## 2018-03-03 DIAGNOSIS — E1140 Type 2 diabetes mellitus with diabetic neuropathy, unspecified: Secondary | ICD-10-CM | POA: Diagnosis not present

## 2018-03-03 DIAGNOSIS — M19072 Primary osteoarthritis, left ankle and foot: Secondary | ICD-10-CM | POA: Diagnosis not present

## 2018-03-05 DIAGNOSIS — E1140 Type 2 diabetes mellitus with diabetic neuropathy, unspecified: Secondary | ICD-10-CM | POA: Diagnosis not present

## 2018-03-05 DIAGNOSIS — M19072 Primary osteoarthritis, left ankle and foot: Secondary | ICD-10-CM | POA: Diagnosis not present

## 2018-03-07 DIAGNOSIS — M19072 Primary osteoarthritis, left ankle and foot: Secondary | ICD-10-CM | POA: Diagnosis not present

## 2018-03-07 DIAGNOSIS — E1140 Type 2 diabetes mellitus with diabetic neuropathy, unspecified: Secondary | ICD-10-CM | POA: Diagnosis not present

## 2018-03-11 DIAGNOSIS — E1140 Type 2 diabetes mellitus with diabetic neuropathy, unspecified: Secondary | ICD-10-CM | POA: Diagnosis not present

## 2018-03-11 DIAGNOSIS — M19072 Primary osteoarthritis, left ankle and foot: Secondary | ICD-10-CM | POA: Diagnosis not present

## 2018-03-12 DIAGNOSIS — M19072 Primary osteoarthritis, left ankle and foot: Secondary | ICD-10-CM | POA: Diagnosis not present

## 2018-03-12 DIAGNOSIS — E1140 Type 2 diabetes mellitus with diabetic neuropathy, unspecified: Secondary | ICD-10-CM | POA: Diagnosis not present

## 2018-03-13 DIAGNOSIS — M19072 Primary osteoarthritis, left ankle and foot: Secondary | ICD-10-CM | POA: Diagnosis not present

## 2018-03-13 DIAGNOSIS — E1140 Type 2 diabetes mellitus with diabetic neuropathy, unspecified: Secondary | ICD-10-CM | POA: Diagnosis not present

## 2018-03-18 DIAGNOSIS — Z1159 Encounter for screening for other viral diseases: Secondary | ICD-10-CM | POA: Diagnosis not present

## 2018-03-18 DIAGNOSIS — N183 Chronic kidney disease, stage 3 (moderate): Secondary | ICD-10-CM | POA: Diagnosis not present

## 2018-03-18 DIAGNOSIS — R809 Proteinuria, unspecified: Secondary | ICD-10-CM | POA: Diagnosis not present

## 2018-03-18 DIAGNOSIS — E559 Vitamin D deficiency, unspecified: Secondary | ICD-10-CM | POA: Diagnosis not present

## 2018-03-18 DIAGNOSIS — Z79899 Other long term (current) drug therapy: Secondary | ICD-10-CM | POA: Diagnosis not present

## 2018-03-18 DIAGNOSIS — D509 Iron deficiency anemia, unspecified: Secondary | ICD-10-CM | POA: Diagnosis not present

## 2018-03-18 DIAGNOSIS — I10 Essential (primary) hypertension: Secondary | ICD-10-CM | POA: Diagnosis not present

## 2018-03-19 ENCOUNTER — Other Ambulatory Visit: Payer: Self-pay | Admitting: Gastroenterology

## 2018-03-19 DIAGNOSIS — E1140 Type 2 diabetes mellitus with diabetic neuropathy, unspecified: Secondary | ICD-10-CM | POA: Diagnosis not present

## 2018-03-19 DIAGNOSIS — M19072 Primary osteoarthritis, left ankle and foot: Secondary | ICD-10-CM | POA: Diagnosis not present

## 2018-03-20 DIAGNOSIS — N181 Chronic kidney disease, stage 1: Secondary | ICD-10-CM | POA: Diagnosis not present

## 2018-03-20 DIAGNOSIS — I509 Heart failure, unspecified: Secondary | ICD-10-CM | POA: Diagnosis not present

## 2018-03-20 DIAGNOSIS — E1140 Type 2 diabetes mellitus with diabetic neuropathy, unspecified: Secondary | ICD-10-CM | POA: Diagnosis not present

## 2018-03-20 DIAGNOSIS — E559 Vitamin D deficiency, unspecified: Secondary | ICD-10-CM | POA: Diagnosis not present

## 2018-03-20 DIAGNOSIS — M19072 Primary osteoarthritis, left ankle and foot: Secondary | ICD-10-CM | POA: Diagnosis not present

## 2018-03-21 DIAGNOSIS — E7800 Pure hypercholesterolemia, unspecified: Secondary | ICD-10-CM | POA: Diagnosis not present

## 2018-03-21 DIAGNOSIS — R0602 Shortness of breath: Secondary | ICD-10-CM | POA: Diagnosis not present

## 2018-03-21 DIAGNOSIS — Z79899 Other long term (current) drug therapy: Secondary | ICD-10-CM | POA: Diagnosis not present

## 2018-03-21 DIAGNOSIS — Z1339 Encounter for screening examination for other mental health and behavioral disorders: Secondary | ICD-10-CM | POA: Diagnosis not present

## 2018-03-21 DIAGNOSIS — E1149 Type 2 diabetes mellitus with other diabetic neurological complication: Secondary | ICD-10-CM | POA: Diagnosis not present

## 2018-03-21 DIAGNOSIS — I10 Essential (primary) hypertension: Secondary | ICD-10-CM | POA: Diagnosis not present

## 2018-03-21 DIAGNOSIS — E1165 Type 2 diabetes mellitus with hyperglycemia: Secondary | ICD-10-CM | POA: Diagnosis not present

## 2018-03-21 DIAGNOSIS — Z1331 Encounter for screening for depression: Secondary | ICD-10-CM | POA: Diagnosis not present

## 2018-03-26 DIAGNOSIS — E1140 Type 2 diabetes mellitus with diabetic neuropathy, unspecified: Secondary | ICD-10-CM | POA: Diagnosis not present

## 2018-03-26 DIAGNOSIS — M19072 Primary osteoarthritis, left ankle and foot: Secondary | ICD-10-CM | POA: Diagnosis not present

## 2018-03-31 DIAGNOSIS — K760 Fatty (change of) liver, not elsewhere classified: Secondary | ICD-10-CM | POA: Diagnosis not present

## 2018-04-17 DIAGNOSIS — R0989 Other specified symptoms and signs involving the circulatory and respiratory systems: Secondary | ICD-10-CM | POA: Diagnosis not present

## 2018-04-17 DIAGNOSIS — R0602 Shortness of breath: Secondary | ICD-10-CM | POA: Diagnosis not present

## 2018-04-17 DIAGNOSIS — I10 Essential (primary) hypertension: Secondary | ICD-10-CM | POA: Diagnosis not present

## 2018-04-17 DIAGNOSIS — I517 Cardiomegaly: Secondary | ICD-10-CM | POA: Diagnosis not present

## 2018-04-29 IMAGING — CT CT ABD-PELV W/ CM
2 of 5 series · 16 of 46 positions shown, 18 images · IV contrast (iopamidol)
Comparison: CT 09/30/2012

CLINICAL DATA: Chronic diarrhea. History of carcinoid tumor of
duodenum and prior resection. Developing worsening diarrhea. Nausea.

EXAM:
CT ABDOMEN AND PELVIS WITH CONTRAST
TECHNIQUE: Multidetector CT imaging of the abdomen and pelvis was performed
using the standard protocol following bolus administration of
intravenous contrast.
CONTRAST:  100mL MZYNRA-C22 IOPAMIDOL (MZYNRA-C22) INJECTION 61%

[Series 2: abd/ pelvis · axial · 0.71mm/px · z∈[-462,-28]mm · 13 of 99 slices shown, 15 images]
[im 6/99  soft-tissue]
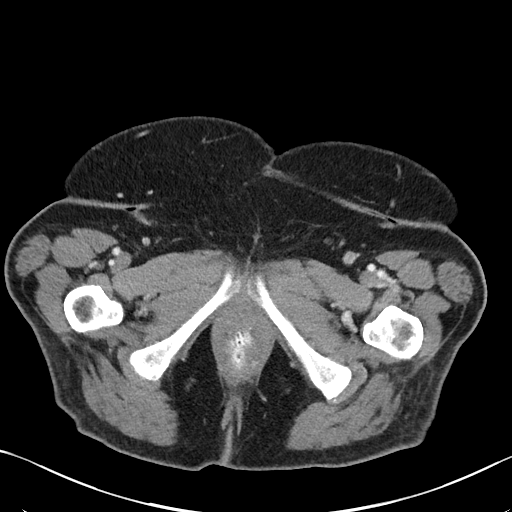
[im 6/99  bone]
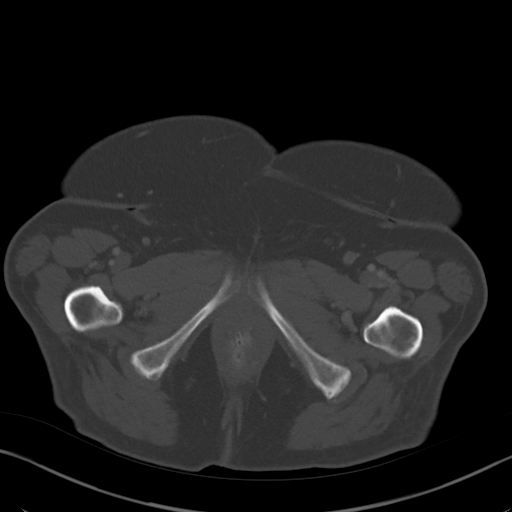
[im 11/99  soft-tissue]
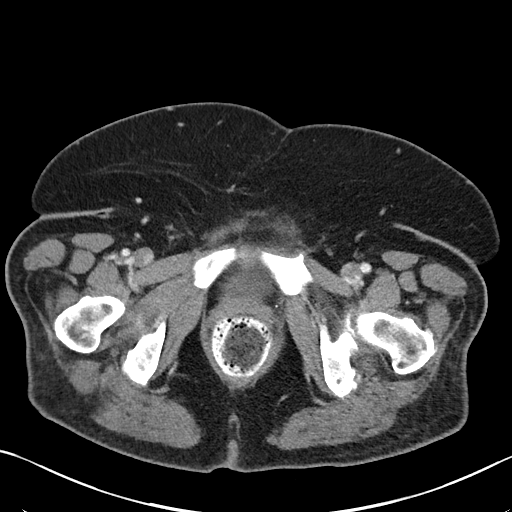
[im 22/99  soft-tissue]
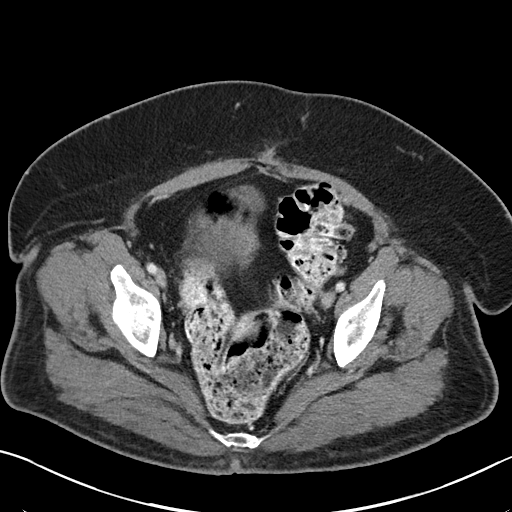
[im 28/99  soft-tissue]
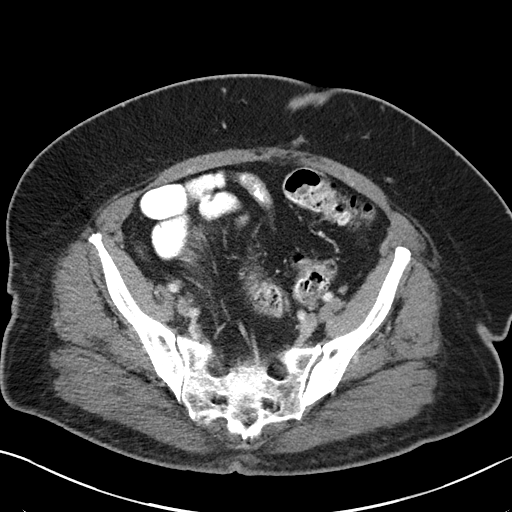
[im 33/99  soft-tissue]
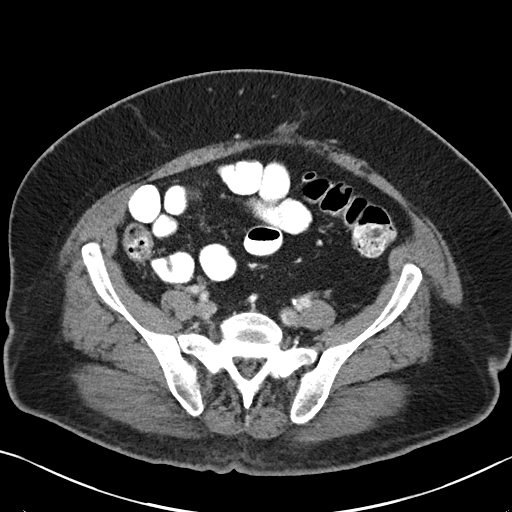
[im 44/99  soft-tissue]
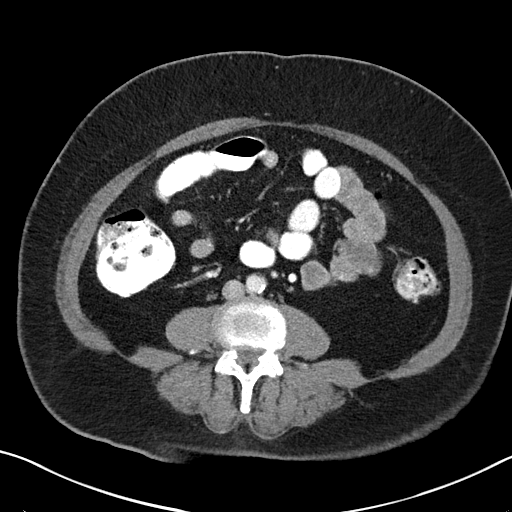
[im 50/99  soft-tissue]
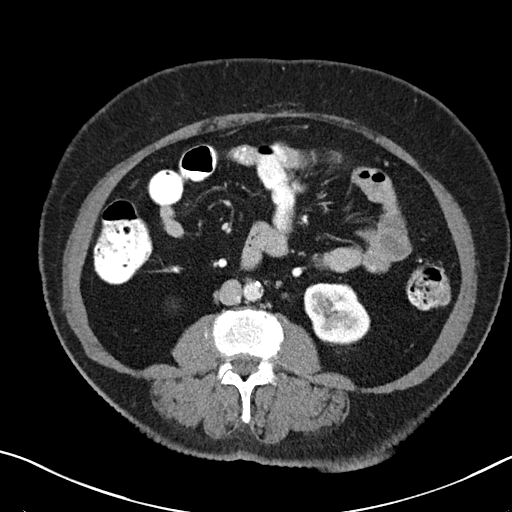
[im 55/99  soft-tissue]
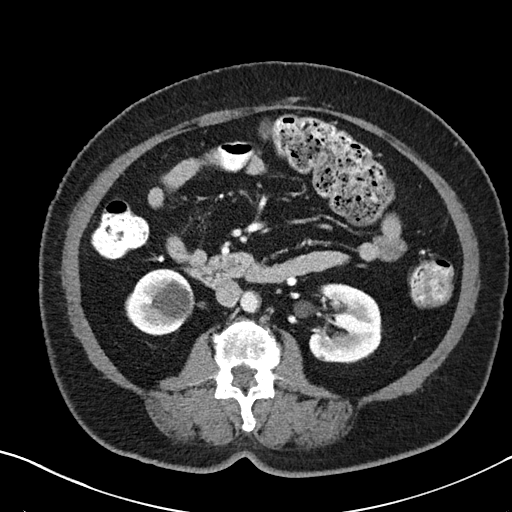
[im 66/99  soft-tissue]
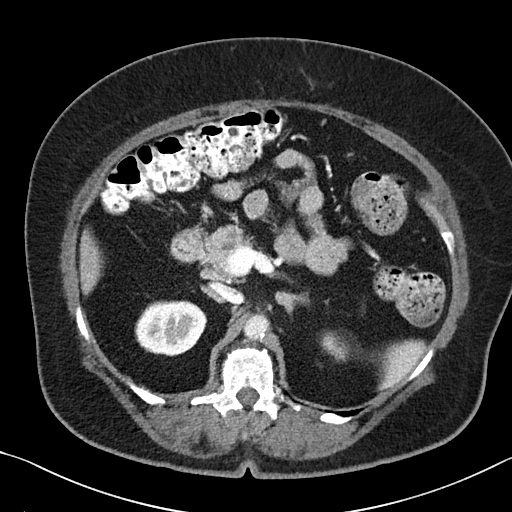
[im 66/99  bone]
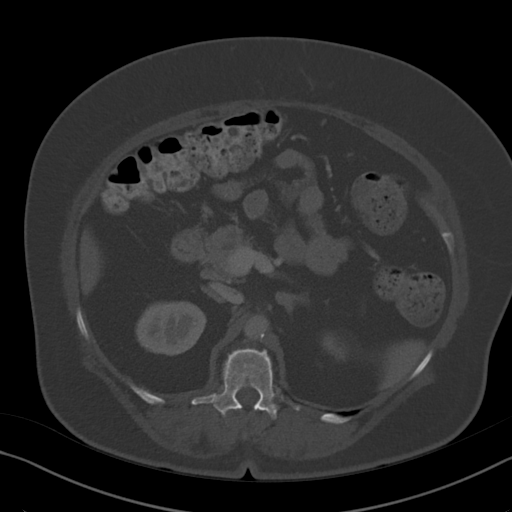
[im 71/99  soft-tissue]
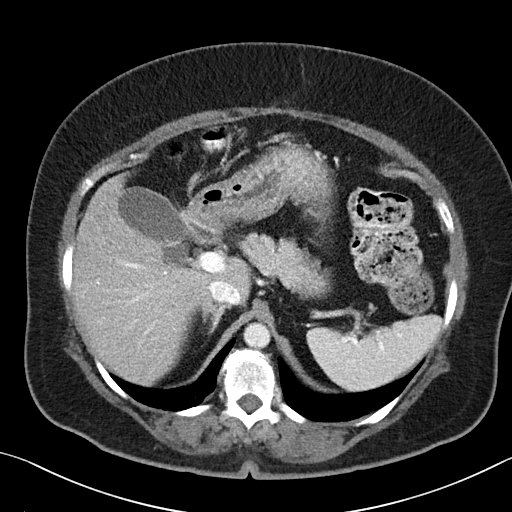
[im 77/99  soft-tissue]
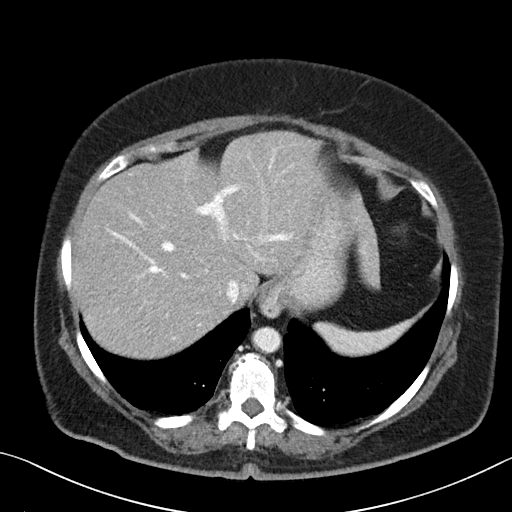
[im 88/99  soft-tissue]
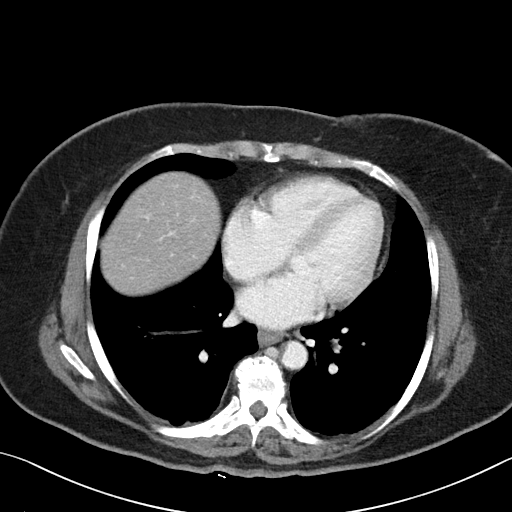
[im 93/99  soft-tissue]
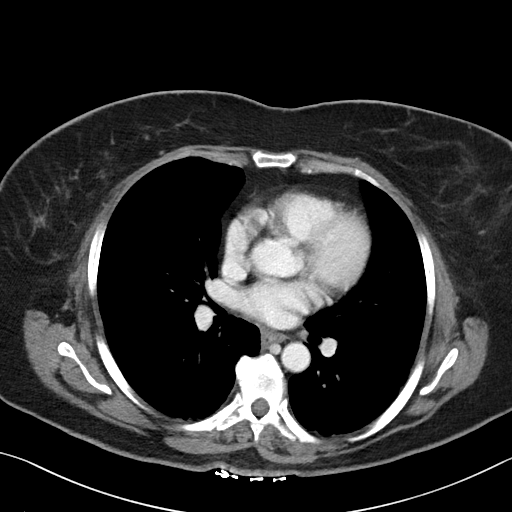

[Series 5: coronal soft tissue · coronal · 0.67mm/px · 3 of 89 slices shown]
[im 30/89  soft-tissue]
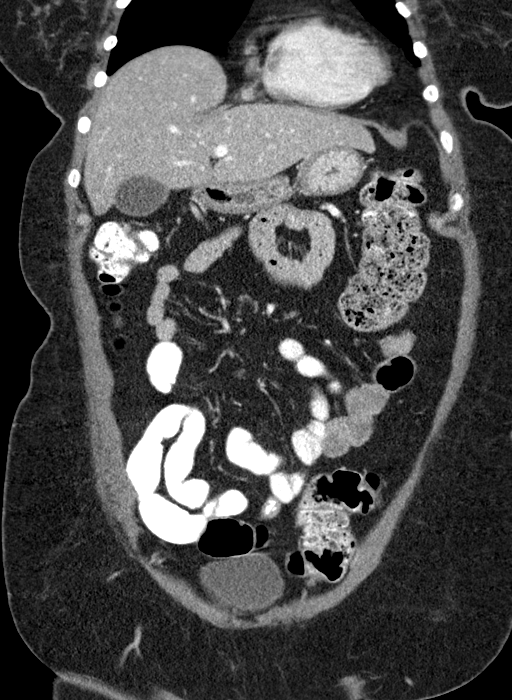
[im 40/89  soft-tissue]
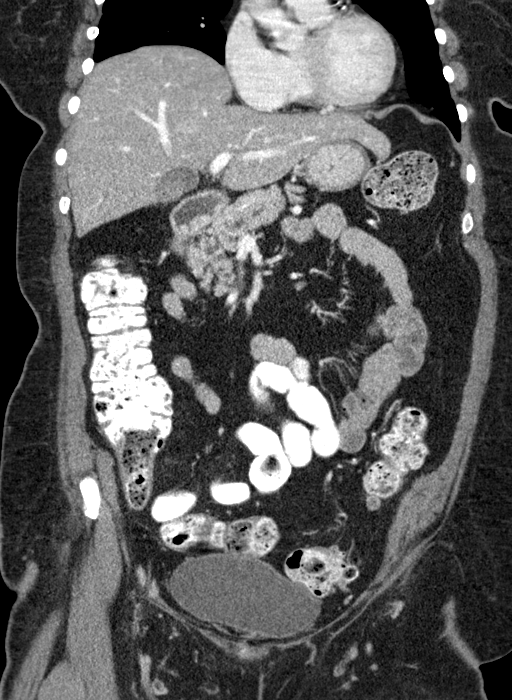
[im 49/89  soft-tissue]
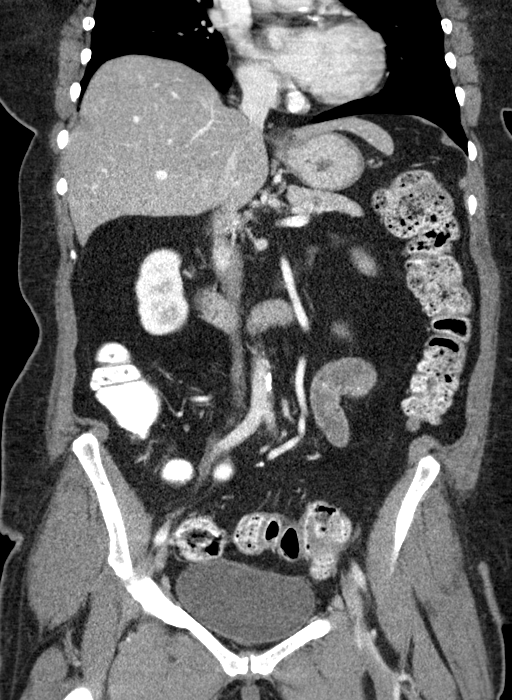

[16 of 46 positions shown; findings below may reference images not displayed]

FINDINGS: Lower chest: Coronary artery calcifications in the visualized left
anterior descending coronary artery. Heart is normal size. Minimal
dependent atelectasis or scarring in the lung bases. No effusions.

Hepatobiliary: No focal hepatic abnormality. Gallbladder
unremarkable. Suspect mild fatty infiltration of the liver.

Pancreas: No focal abnormality or ductal dilatation.

Spleen: No focal abnormality.  Normal size.

Adrenals/Urinary Tract: No adrenal abnormality. No focal renal
abnormality. No stones or hydronephrosis. Urinary bladder is
unremarkable. Stable cyst in the lower pole of the right kidney.

Stomach/Bowel: Sigmoid diverticulosis. No active diverticulitis. Low
moderate stool burden throughout the colon. Small bowel is
decompressed as is the stomach, grossly unremarkable.

Vascular/Lymphatic: Aortic atherosclerosis. No aneurysm. No
adenopathy.

Reproductive: Prior hysterectomy.  No adnexal masses.

Other: No free fluid or free air.

Musculoskeletal: No acute bony abnormality or focal bone lesion.
Degenerative changes in the thoracolumbar spine.
IMPRESSION: No acute findings in the abdomen or pelvis.

Sigmoid diverticulosis.

Moderate stool burden in the colon.

Mild diffuse fatty infiltration of the liver, stable.

Coronary artery disease, aortic atherosclerosis.

## 2018-05-02 DIAGNOSIS — M25511 Pain in right shoulder: Secondary | ICD-10-CM | POA: Diagnosis not present

## 2018-05-02 DIAGNOSIS — M25561 Pain in right knee: Secondary | ICD-10-CM | POA: Diagnosis not present

## 2018-05-02 DIAGNOSIS — M79671 Pain in right foot: Secondary | ICD-10-CM | POA: Diagnosis not present

## 2018-05-14 ENCOUNTER — Other Ambulatory Visit: Payer: Self-pay | Admitting: Gastroenterology

## 2018-06-19 ENCOUNTER — Telehealth: Payer: Self-pay | Admitting: *Deleted

## 2018-06-19 ENCOUNTER — Inpatient Hospital Stay: Payer: No Typology Code available for payment source

## 2018-06-19 ENCOUNTER — Inpatient Hospital Stay: Payer: No Typology Code available for payment source | Admitting: Oncology

## 2018-06-19 NOTE — Telephone Encounter (Signed)
Received vm message from patient regarding when to stop her dexilent prior to her visit for labs and Dr. Benay Spice on the 27 th.   TCT patient and informed her to hold the dexilent for 4 days prior to her appts. Pt voiced understanding. No other questions or concerns.

## 2018-06-27 ENCOUNTER — Telehealth: Payer: Self-pay | Admitting: Nurse Practitioner

## 2018-06-27 NOTE — Telephone Encounter (Signed)
Patient called to reschedule  °

## 2018-07-03 ENCOUNTER — Other Ambulatory Visit: Payer: No Typology Code available for payment source

## 2018-07-03 ENCOUNTER — Ambulatory Visit: Payer: No Typology Code available for payment source | Admitting: Nurse Practitioner

## 2018-07-09 ENCOUNTER — Other Ambulatory Visit: Payer: Self-pay | Admitting: *Deleted

## 2018-07-09 DIAGNOSIS — D3A098 Benign carcinoid tumors of other sites: Secondary | ICD-10-CM

## 2018-07-10 ENCOUNTER — Inpatient Hospital Stay (HOSPITAL_BASED_OUTPATIENT_CLINIC_OR_DEPARTMENT_OTHER): Payer: Medicare Other | Admitting: Nurse Practitioner

## 2018-07-10 ENCOUNTER — Inpatient Hospital Stay: Payer: Medicare Other | Attending: Oncology

## 2018-07-10 ENCOUNTER — Encounter: Payer: Self-pay | Admitting: Nurse Practitioner

## 2018-07-10 ENCOUNTER — Other Ambulatory Visit: Payer: Self-pay

## 2018-07-10 VITALS — BP 140/76 | HR 77 | Temp 98.7°F | Resp 17 | Ht 61.5 in | Wt 155.1 lb

## 2018-07-10 DIAGNOSIS — E119 Type 2 diabetes mellitus without complications: Secondary | ICD-10-CM

## 2018-07-10 DIAGNOSIS — I10 Essential (primary) hypertension: Secondary | ICD-10-CM | POA: Insufficient documentation

## 2018-07-10 DIAGNOSIS — D3A098 Benign carcinoid tumors of other sites: Secondary | ICD-10-CM

## 2018-07-10 DIAGNOSIS — R197 Diarrhea, unspecified: Secondary | ICD-10-CM | POA: Diagnosis not present

## 2018-07-10 DIAGNOSIS — Z85060 Personal history of malignant carcinoid tumor of small intestine: Secondary | ICD-10-CM

## 2018-07-10 NOTE — Progress Notes (Signed)
  [facility] OFFICE PROGRESS NOTE   Diagnosis: Carcinoid tumor  INTERVAL HISTORY:   Tiffany Ortiz returns as scheduled.  She reports 2 recent falls.  She has arthritis knee pain.  She is followed by orthopedics and reports a recent injection.  Her diarrhea is better overall.  She notes that stress causes short-term diarrhea.  She has periodic nausea.  No vomiting.  Appetite varies.  Her weight is stable.  She intermittently feels hot and cold.  Objective:  Vital signs in last 24 hours:  Blood pressure 140/76, pulse 77, temperature 98.7 F (37.1 C), temperature source Oral, resp. rate 17, height 5' 1.5" (1.562 m), weight 155 lb 1.6 oz (70.4 kg), SpO2 98 %.    HEENT: Neck without mass. Lymphatics: No palpable cervical, supraclavicular or axillary lymph nodes. Resp: Lungs clear bilaterally. Cardio: Regular rate and rhythm. GI: Abdomen soft and nontender.  No hepatomegaly.  No mass. Vascular: No leg edema.    Lab Results:  Lab Results  Component Value Date   WBC 9.9 04/05/2013   HGB 10.7 (L) 04/05/2013   HCT 31.9 (L) 04/05/2013   MCV 87.9 04/05/2013   PLT 307 04/05/2013    Imaging:  No results found.  Medications: I have reviewed the patient's current medications.  Assessment/Plan: 1. 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, clinical stage III (uT2, pN1), status post an endoscopic biopsy 02/04/2013 confirming a duodenal carcinoid tumor, status post a duodenal resection 03/31/2013 with no remaining tumor found in the duodenum and a positive portal lymph node   Repeat upper endoscopy/EUS on 08/20/2013 confirming a persistent duodenal nodule with a biopsy confirming a low-grade neuroendocrine carcinoma  Repeat upper endoscopy 09/27/2014 with a duodenal nodule proximal to a surgical scar, biopsy confirmed a well-differentiated neuroendocrine tumor  Upper endoscopy 04/20/2016-benign-appearing esophageal stenosis,  dilated.  Previously known duodenal bulb carcinoid unchanged.  Biopsy well-differentiated neuroendocrine tumor. 2. Diarrhea-intermittent-improved with high Cosamin 3. Diabetes  4. Sleep apnea  5. Fibromyalgia  6. Hypertension  7. Gastroesophageal reflux disease  8. Status post dilatation of a Schatzki's ring 02/04/2013  9. Vitiligo and hyperpigmented skin rash-evaluated by dermatology  Disposition: Tiffany Ortiz appears stable.  There is no clinical evidence of progression of the carcinoid.  We will follow-up on the chromogranin A level from today.  She will return for a follow-up visit and chromogranin A level in 1 year.  She will contact the office in the interim with any problems.  Plan reviewed with Dr. Benay Spice.    Ned Card ANP/GNP-BC   07/10/2018  2:31 PM

## 2018-07-11 ENCOUNTER — Telehealth: Payer: Self-pay | Admitting: Nurse Practitioner

## 2018-07-11 NOTE — Telephone Encounter (Signed)
Scheduled appt per 3/5 los - sent reminder letter in the mail - f/u in 1 year.

## 2018-07-15 LAB — CHROMOGRANIN A REBASELINE
CHROMOGRANIN A (NG/ML): 413.3 ng/mL — AB (ref 0.0–101.8)
Chromogranin A: 7 nmol/L — ABNORMAL HIGH (ref 0–5)

## 2018-07-22 ENCOUNTER — Ambulatory Visit: Payer: No Typology Code available for payment source | Admitting: Nutrition

## 2018-08-15 ENCOUNTER — Other Ambulatory Visit: Payer: Self-pay | Admitting: Gastroenterology

## 2018-08-21 ENCOUNTER — Telehealth: Payer: Self-pay | Admitting: Gastroenterology

## 2018-08-21 NOTE — Telephone Encounter (Signed)
Called patient back to update her chart for 08/25/18 phone visit.

## 2018-08-21 NOTE — Telephone Encounter (Signed)
Pt stated that someone called her but she did not get to the phone in time.  There is no note in the system.

## 2018-08-25 ENCOUNTER — Ambulatory Visit (INDEPENDENT_AMBULATORY_CARE_PROVIDER_SITE_OTHER): Payer: Medicare Other | Admitting: Gastroenterology

## 2018-08-25 ENCOUNTER — Other Ambulatory Visit: Payer: Self-pay

## 2018-08-25 ENCOUNTER — Encounter: Payer: Self-pay | Admitting: Gastroenterology

## 2018-08-25 VITALS — BP 140/76 | HR 77 | Ht 61.5 in | Wt 155.0 lb

## 2018-08-25 DIAGNOSIS — R1310 Dysphagia, unspecified: Secondary | ICD-10-CM | POA: Diagnosis not present

## 2018-08-25 DIAGNOSIS — D3A010 Benign carcinoid tumor of the duodenum: Secondary | ICD-10-CM | POA: Diagnosis not present

## 2018-08-25 NOTE — Progress Notes (Signed)
Review of pertinent gastrointestinal problems: 1. Chronic diarrhea:Colonoscopy Dr. Gala Romney 02/2013: pan diverticulosis, normal TI, biopsies randomly from colon were normal, a few small HPs were removed. 01/2013 celiac panel, TSH were both normal.  2018 dicyclomine works very well for her diarrhea 2. 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 Gala Romney), clinical stage III (uT2, pN1), status post an endoscopic biopsy 02/04/2013 confirming a duodenal carcinoid tumor, status post a duodenal resection 03/31/2013 Dr. Barry Dienes with no remaining tumor found in the duodenum and a positive portal lymph node; Repeat upper endoscopy/EUS on 08/20/2013 confirming a persistent duodenal nodule with a biopsy confirming a low-grade neuroendocrine carcinoma. EGD 2016 Dr. Ardis Hughs, same; the previously noted duodenal nodule was still present in the duodenum, this is carcinoid and it was probably never removed during her duodenal resection surgery. EGD Dr. Ardis Hughs 04/2016: same nodule (path showed carcinoid again).  Also benign appearing GE junction stricture was dilated to 20 mm  Follows with Dr. Julieanne Manson. 3. EUS 10/2014above also noted changes consistent withchronic pancreatitis 4.  Dysphasia, benign GE junction stricture.  Dilated 12 2017 to 20 mm with balloon.   This service was provided via virtual visit.  Only audio was used.  The patient was located at home.  I was located in my office.  The patient did consent to this virtual visit and is aware of possible charges through their insurance for this visit.  The patient is an established patient.  Nobody else participated in this virtual service however my certified medical assistant helped with follow-up recommendations.  Time spent on virtual visit: 27 min   HPI: This is a very pleasant 73 year old woman whom I have not spoken with in about a year  Still having trouble with intermittent dysphagia and choking  on liquids even.  The dysphagia is not as bad as it was previously.  She takes dexilant once as day, every other day or so. Probably only about once per week lately.  Her weight is stable overall.  I last saw her June 2019, about a year ago.  At that time I recommended an EGD to repeat stricture dilation in her esophagus.  She canceled the appointment because of a death in the family, a granddaughter who was 63.  She is currently very reluctant to leave her house given the pandemic.   Chief complaint is intermittent dysphasia  ROS: complete GI ROS as described in HPI, all other review negative.  Constitutional:  No unintentional weight loss   Past Medical History:  Diagnosis Date  . Allergy   . Anxiety   . Arthritis   . Cancer [facility])    carcinoid tumor stomach  . Cataract   . Depression   . Diabetes mellitus   . Fibromyalgia   . GERD (gastroesophageal reflux disease)   . Headache(784.0)   . Hyperlipidemia   . Hyperplastic colon polyp 02/04/2013  . Hypertension   . IBS (irritable bowel syndrome)   . Osteoporosis   . Polyp of rectum 02/04/2013  . Renal disorder   . Shortness of breath   . Sleep apnea   . Vitamin D deficiency     Past Surgical History:  Procedure Laterality Date  . ABDOMINAL HYSTERECTOMY     Cervical cancer, with incidental appendectomy  . APPENDECTOMY    . BIOPSY N/A 02/04/2013   Procedure: BIOPSY;  Surgeon: Daneil Dolin, MD;  Location: [facility];  Service: Endoscopy;  Laterality: N/A;  SB BX  AND RANDOM COLON BX  . bladder tack    . BREAST LUMPECTOMY     benign  . COLONOSCOPY  09/22/2003   RMR: Diminutive rectal polyps cold biopsy/removed.  Otherwise normal rectal mucosa/Pancolonic diverticula.  The remainder of the colonic mucosa appeared normal  . COLONOSCOPY WITH ESOPHAGOGASTRODUODENOSCOPY (EGD) N/A 02/04/2013   Procedure: COLONOSCOPY WITH ESOPHAGOGASTRODUODENOSCOPY (EGD);  Surgeon: Daneil Dolin, MD;  Location: [facility];  Service:  Endoscopy;  Laterality: N/A;  10:15-moved to 9:30am  . ESOPHAGOGASTRODUODENOSCOPY  11/19/2006   VBT:YOMAYOKHT Schatzki's ring with overlying distal esophageal erosions consistent with erosive reflux esophagitis, status post dilation and disruption of ring as described above/ Small hiatal hernia/Otherwise normal stomach, first duodenum and second duodenum  . EUS N/A 02/12/2013   Procedure: UPPER ENDOSCOPIC ULTRASOUND (EUS) LINEAR;  Surgeon: Milus Banister, MD;  Location: [facility];  Service: Endoscopy;  Laterality: N/A;  . EUS N/A 08/20/2013   Procedure: UPPER ENDOSCOPIC ULTRASOUND (EUS) LINEAR;  Surgeon: Milus Banister, MD;  Location: [facility];  Service: Endoscopy;  Laterality: N/A;  . LAPAROSCOPIC SMALL BOWEL RESECTION N/A 03/31/2013   Procedure: LAPAROSCOPIC  ASSISTED CONVERTED TO OPEN RESECTION DUODENAL MASS/UPPER ENDOSCOPY;  Surgeon: Stark Klein, MD;  Location: [facility];  Service: General;  Laterality: N/A;  . TONSILLECTOMY      Current Outpatient Medications  Medication Sig Dispense Refill  . amLODipine (NORVASC) 5 MG tablet Take 5 mg by mouth daily.    . Cholecalciferol (VITAMIN D PO) Take 5,000 Units by mouth once a week. Mondays    . clonazePAM (KLONOPIN) 1 MG tablet Take 1 mg by mouth 3 (three) times daily.    . Coenzyme Q10 (CO Q-10 PO) Take 1 tablet by mouth daily.    Marland Kitchen CREON 12000 units CPEP capsule TAKE 2 CAPSULES DAILY WITH EACH MEAL AND 1 CAPSULE WITH A SNACK (2 PER DAY). 200 capsule 0  . Cyanocobalamin (VITAMIN B-12 PO) Take 1 tablet by mouth daily.    Marland Kitchen dexlansoprazole (DEXILANT) 60 MG capsule Take 1 capsule (60 mg total) by mouth daily. 30 capsule 5  . fexofenadine (ALLEGRA) 30 MG tablet Take 30 mg by mouth 2 (two) times daily.    Marland Kitchen gabapentin (NEURONTIN) 300 MG capsule Take 300 mg by mouth 2 (two) times daily.     Marland Kitchen glipiZIDE (GLUCOTROL) 2.5 mg TABS tablet Take 2.5 mg by mouth daily before breakfast. IF blood sugar drops below 100 for 3 days in a row cut dose in half     . hyoscyamine (LEVBID) 0.375 MG 12 hr tablet Take 0.375 mg by mouth 2 (two) times daily.    . Inulin (FIBER CHOICE FRUITY BITES) 1.5 g CHEW Chew 1 tablet by mouth daily.    Marland Kitchen loperamide (IMODIUM A-D) 2 MG tablet Take 2 mg by mouth as needed for diarrhea or loose stools.    . Multiple Vitamin (MULTIVITAMIN) tablet Take 1 tablet by mouth daily. With Vit D    . Nutritional Supplements (DHEA PO) Take 1 tablet by mouth at bedtime.    . Omega-3 Fatty Acids (OMEGA 3 PO) Take 1 capsule by mouth daily. With Vit D    . oxymetazoline (AFRIN) 0.05 % nasal spray Place 1 spray into both nostrils 2 (two) times daily as needed for congestion.    . pravastatin (PRAVACHOL) 10 MG tablet Take 20 mg by mouth every other day.     . Probiotic Product (PROBIOTIC PO) Take 1 tablet by mouth daily.    Marland Kitchen Propylene  Glycol-Glycerin (SOOTHE OP) Place 1 drop into both eyes 4 (four) times daily.     Marland Kitchen triamcinolone cream (KENALOG) 0.1 % Apply 1 application topically 2 (two) times daily.    . valACYclovir (VALTREX) 500 MG tablet Take 500 mg by mouth as needed. Reported on 10/31/2015     Current Facility-Administered Medications  Medication Dose Route Frequency Provider Last Rate Last Dose  . 0.9 %  sodium chloride infusion  500 mL Intravenous Continuous Milus Banister, MD        Allergies as of 08/25/2018 - Review Complete 08/25/2018  Allergen Reaction Noted  . Other  01/28/2013  . Asa [aspirin]  02/06/2013  . Erythromycin  01/28/2013  . Famciclovir  02/11/2013  . Hydrocodone  02/04/2013  . Ibuprofen  02/06/2013  . Lactase Diarrhea 11/30/2013  . Lactose intolerance (gi) Diarrhea and Other (See Comments) 03/31/2013  . Morphine and related Other (See Comments) 09/27/2014  . Oxycodone  01/28/2013  . Promethazine Nausea And Vomiting 01/30/2011  . Tylenol [acetaminophen]  02/06/2013    Family History  Problem Relation Age of Onset  . Cancer Mother        ?ovarian  . Cancer Father   . Colon cancer Neg Hx      Social History   Socioeconomic History  . Marital status: Married    Spouse name: Not on file  . Number of children: 2  . Years of education: Not on file  . Highest education level: Not on file  Occupational History  . Occupation: retired  Scientific laboratory technician  . Financial resource strain: Not on file  . Food insecurity:    Worry: Not on file    Inability: Not on file  . Transportation needs:    Medical: Not on file    Non-medical: Not on file  Tobacco Use  . Smoking status: Former Smoker    Types: Cigarettes  . Smokeless tobacco: Former Systems developer    Quit date: 02/11/1970  Substance and Sexual Activity  . Alcohol use: No  . Drug use: No  . Sexual activity: Not on file  Lifestyle  . Physical activity:    Days per week: Not on file    Minutes per session: Not on file  . Stress: Not on file  Relationships  . Social connections:    Talks on phone: Not on file    Gets together: Not on file    Attends religious service: Not on file    Active member of club or organization: Not on file    Attends meetings of clubs or organizations: Not on file    Relationship status: Not on file  . Intimate partner violence:    Fear of current or ex partner: Not on file    Emotionally abused: Not on file    Physically abused: Not on file    Forced sexual activity: Not on file  Other Topics Concern  . 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 female with duodenal carcinoid, also intermittent dysphasia  First we spoke at length about  her duodenal carcinoid.  It is still present.  The last time I looked was about 2-1/2 years ago and it had not really  changed.  I do not see any reason to be periodically surveying it at this point.  She has intermittent dysphasia, a benign distal esophagus stricture was stretched by me about 2-1/2 years ago.  Her weight has been overall stable.  I suspect she has the same benign stricture causing really pretty minor symptoms for her right now.  She is very reluctant to leave the house given the pandemic restrictions.  I recommended a return office visit here in person in 3 months, hopefully we will be able to do that as the pandemic continues, eases hopefully.  At that time if she is still having periodic symptoms I would proceed with EGD and dilation.  She will continue on proton pump inhibitor for now.  Please see the "Patient Instructions" section for addition details about the plan.  Owens Loffler, MD [facility] 08/25/2018, 9:47 AM

## 2018-08-25 NOTE — Patient Instructions (Signed)
Return office visit with me in person in 3 months.

## 2018-12-08 ENCOUNTER — Ambulatory Visit: Payer: Medicare Other | Admitting: Gastroenterology

## 2018-12-30 ENCOUNTER — Other Ambulatory Visit: Payer: Self-pay | Admitting: Gastroenterology

## 2019-01-07 ENCOUNTER — Ambulatory Visit: Payer: Medicare Other | Admitting: Gastroenterology

## 2019-01-07 ENCOUNTER — Other Ambulatory Visit: Payer: Self-pay

## 2019-01-08 ENCOUNTER — Encounter: Payer: Self-pay | Admitting: Gastroenterology

## 2019-01-08 ENCOUNTER — Other Ambulatory Visit: Payer: Self-pay | Admitting: Gastroenterology

## 2019-01-19 ENCOUNTER — Other Ambulatory Visit: Payer: Self-pay

## 2019-01-19 ENCOUNTER — Ambulatory Visit (AMBULATORY_SURGERY_CENTER): Payer: Self-pay | Admitting: *Deleted

## 2019-01-19 VITALS — Temp 97.3°F | Ht 63.5 in | Wt 156.6 lb

## 2019-01-19 DIAGNOSIS — R1310 Dysphagia, unspecified: Secondary | ICD-10-CM

## 2019-01-19 NOTE — Progress Notes (Signed)
No egg or soy allergy known to patient  No issues with past sedation with any surgeries  or procedures, no intubation problems  No diet pills per patient No home 02 use per patient  No blood thinners per patient  Pt denies issues with constipation  No A fib or A flutter  EMMI video sent to pt's e mail  

## 2019-01-20 ENCOUNTER — Telehealth: Payer: Self-pay

## 2019-01-20 NOTE — Telephone Encounter (Signed)
Covid-19 screening questions   Do you now or have you had a fever in the last 14 days?  Do you have any respiratory symptoms of shortness of breath or cough now or in the last 14 days?  Do you have any family members or close contacts with diagnosed or suspected Covid-19 in the past 14 days?  Have you been tested for Covid-19 and found to be positive?       

## 2019-01-21 ENCOUNTER — Other Ambulatory Visit: Payer: Self-pay

## 2019-01-21 ENCOUNTER — Ambulatory Visit (AMBULATORY_SURGERY_CENTER): Payer: Medicare Other | Admitting: Gastroenterology

## 2019-01-21 ENCOUNTER — Other Ambulatory Visit: Payer: Self-pay | Admitting: Gastroenterology

## 2019-01-21 ENCOUNTER — Encounter: Payer: Self-pay | Admitting: Gastroenterology

## 2019-01-21 VITALS — BP 168/73 | HR 61 | Temp 98.6°F | Resp 14 | Ht 63.5 in | Wt 156.6 lb

## 2019-01-21 DIAGNOSIS — K2980 Duodenitis without bleeding: Secondary | ICD-10-CM

## 2019-01-21 DIAGNOSIS — K3189 Other diseases of stomach and duodenum: Secondary | ICD-10-CM | POA: Diagnosis not present

## 2019-01-21 DIAGNOSIS — K222 Esophageal obstruction: Secondary | ICD-10-CM | POA: Diagnosis not present

## 2019-01-21 DIAGNOSIS — K2970 Gastritis, unspecified, without bleeding: Secondary | ICD-10-CM

## 2019-01-21 DIAGNOSIS — R1310 Dysphagia, unspecified: Secondary | ICD-10-CM | POA: Diagnosis present

## 2019-01-21 DIAGNOSIS — K449 Diaphragmatic hernia without obstruction or gangrene: Secondary | ICD-10-CM | POA: Diagnosis not present

## 2019-01-21 MED ORDER — SODIUM CHLORIDE 0.9 % IV SOLN: 500.0000 mL | Freq: Once | INTRAVENOUS | Status: DC

## 2019-01-21 NOTE — Patient Instructions (Addendum)
Thank you for allowing [place] to care for you today!  Await pathology results by mail, approximately 2 weeks.  Resume previous diet and medications today.  Janne Napoleon more towards a soft diet, may take Tylenol for discomfort.,  Return to your normal activities tomorrow.     YOU HAD AN ENDOSCOPIC PROCEDURE TODAY AT [facility]:   Refer to the procedure report that was given to you for any specific questions about what was found during the examination.  If the procedure report does not answer your questions, please call your gastroenterologist to clarify.  If you requested that your care partner not be given the details of your procedure findings, then the procedure report has been included in a sealed envelope for you to review at your convenience later.  YOU SHOULD EXPECT: Some feelings of bloating in the abdomen. Passage of more gas than usual.  Walking can help get rid of the air that was put into your GI tract during the procedure and reduce the bloating. If you had a lower endoscopy (such as a colonoscopy or flexible sigmoidoscopy) you may notice spotting of blood in your stool or on the toilet paper. If you underwent a bowel prep for your procedure, you may not have a normal bowel movement for a few days.  Please Note:  You might notice some irritation and congestion in your nose or some drainage.  This is from the oxygen used during your procedure.  There is no need for concern and it should clear up in a day or so.  SYMPTOMS TO REPORT IMMEDIATELY:    Following upper endoscopy (EGD)  Vomiting of blood or coffee ground material  New chest pain or pain under the shoulder blades  Painful or persistently difficult swallowing  New shortness of breath  Fever of 100F or higher  Black, tarry-looking stools  For urgent or emergent issues, a gastroenterologist can be reached at any hour by calling [phone].   DIET:  We do recommend a small meal at first, but then you may  proceed to your regular diet.  Drink plenty of fluids but you should avoid alcoholic beverages for 24 hours.  ACTIVITY:  You should plan to take it easy for the rest of today and you should NOT DRIVE or use heavy machinery until tomorrow (because of the sedation medicines used during the test).    FOLLOW UP: Our staff will call the number listed on your records 48-72 hours following your procedure to check on you and address any questions or concerns that you may have regarding the information given to you following your procedure. If we do not reach you, we will leave a message.  We will attempt to reach you two times.  During this call, we will ask if you have developed any symptoms of COVID 19. If you develop any symptoms (ie: fever, flu-like symptoms, shortness of breath, cough etc.) before then, please call [phone].  If you test positive for Covid 19 in the 2 weeks post procedure, please call and report this information to [place].    If any biopsies were taken you will be contacted by phone or by letter within the next 1-3 weeks.  Please call us at [phone] if you have not heard about the biopsies in 3 weeks.    SIGNATURES/CONFIDENTIALITY: You and/or your care partner have signed paperwork which will be entered into your electronic medical record.  These signatures attest to the fact that that the information above  on your After Visit Summary has been reviewed and is understood.  Full responsibility of the confidentiality of this discharge information lies with you and/or your care-partner.

## 2019-01-21 NOTE — Progress Notes (Signed)
Pt's states no medical or surgical changes since previsit or office visit.  [facility] - temps Tiffany Ortiz - vitals

## 2019-01-21 NOTE — Op Note (Signed)
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 Gala Romney), clinical stage III (uT2, pN1),                            status post an endoscopic biopsy 02/04/2013                            confirming a duodenal carcinoid tumor, status post                            a duodenal resection 03/31/2013 Dr. Barry Dienes with no                            remaining tumor found in the duodenum and a                            positive portal lymph node; Repeat upper                            endoscopy/EUS on 08/20/2013 confirming a persistent                            duodenal nodule with a biopsy confirming a                            low-grade neuroendocrine carcinoma. EGD 2016 Dr.                            Ardis Hughs, same; the previously noted duodenal nodule                            was still present in the duodenum, this is                            carcinoid and it was probably never removed during                            her duodenal resection surgery. EGD Dr. Ardis Hughs                            04/2016: same nodule (path showed carcinoid again).  Also benign appearing GE junction stricture was                            dilated to 20 mm Medicines:                Monitored Anesthesia Care Procedure:                Pre-Anesthesia Assessment:                           - Prior to the procedure, a History and Physical                            was performed, and patient medications and                            allergies were reviewed. [name] tolerance  of                            previous anesthesia was also reviewed. The risks                            and benefits of the procedure and the sedation                            options and risks were discussed with the patient.                            All questions were answered, and informed consent                            was obtained. Prior Anticoagulants: The patient has                            taken no previous anticoagulant or antiplatelet                            agents. ASA Grade Assessment: II - A patient with                            mild systemic disease. After reviewing the risks                            and benefits, the patient was deemed in                            satisfactory condition to undergo the procedure.                           After obtaining informed consent, the endoscope was                            passed under direct vision. Throughout the  procedure, the patient's blood pressure, pulse, and                            oxygen saturations were monitored continuously. The                            Endoscope was introduced through the mouth, and                            advanced to the second part of duodenum. The upper                            GI endoscopy was accomplished without difficulty.                            The patient tolerated the procedure well. Scope In: Scope Out: Findings:                 Small hiatal hernia.                           Moderate inflammation characterized by erosions,                            erythema and friability was found in the gastric                            antrum. Biopsies were taken with a cold forceps for                            histology.                           One benign-appearing, intrinsic mild stenosis                            (focal peptic stricture) was found at the                            gastroesophageal junction. A TTS dilator was passed                             through the scope. Dilation with a 16-17-18 mm                            balloon dilator was performed to 18 mm.                           Non-specific duodenitis in second duodenum.                           The previously well documented duodenal nodule was                            again noted, it is unchanged.  The exam was otherwise without abnormality. Complications:            No immediate complications. Estimated blood loss:                            None. Estimated Blood Loss:     Estimated blood loss: none. Impression:               - Focal peptic GE junction stricture, dilated to                            76mm today.                           - Small hiatal hernia.                           - Moderate gastritis, biopsied to check for H.                            pylori                           - The previously well documented duodenal nodule                            was again noted, it is unchanged.                           - The examination was otherwise normal. Recommendation:           - Patient has a contact number available for                            emergencies. The signs and symptoms of potential                            delayed complications were discussed with the                            patient. Return to normal activities tomorrow.                            Written discharge instructions were provided to the                            patient.                           - Resume previous diet.                           - Continue present medications.                           - Await pathology results. Milus Banister, MD 01/21/2019 3:15:16 PM This report has been signed electronically.

## 2019-01-21 NOTE — Progress Notes (Signed)
Called to room to assist during endoscopic procedure.  Patient ID and intended procedure confirmed with present staff. Received instructions for my participation in the procedure from the performing physician.  

## 2019-01-21 NOTE — Progress Notes (Signed)
PT taken to PACU. Monitors in place. VSS. Report given to RN. 

## 2019-01-23 ENCOUNTER — Telehealth: Payer: Self-pay | Admitting: *Deleted

## 2019-01-23 DIAGNOSIS — R1310 Dysphagia, unspecified: Secondary | ICD-10-CM

## 2019-01-23 MED ORDER — NYSTATIN 100000 UNIT/ML MT SUSP: OROMUCOSAL | 0 refills | Status: DC

## 2019-01-23 MED ORDER — DEXLANSOPRAZOLE 60 MG PO CPDR: 60.0000 mg | DELAYED_RELEASE_CAPSULE | Freq: Every day | ORAL | 5 refills | Status: DC

## 2019-01-23 NOTE — Telephone Encounter (Signed)
Dr. Steve Rattler recommendations noted.  I sent the prescriptions into her pharmacy.  I attempted to reach her twice, but unable to.  I am unable to leave a message.  Judson Roch Monday will follow up with her on Monday

## 2019-01-23 NOTE — Telephone Encounter (Signed)
DOD  I have reviewed the chart.   Plan: -Continue Dexilant 60 mg p.o. once a day. #30. -Lets try nystatin swishes and swallows 10 cc QID x 3 days (400-600K IUs). -Please call us if she is not better by Monday  Thx  RG

## 2019-01-23 NOTE — Telephone Encounter (Signed)
Dr. Ardis Hughs,  This pt called back this a.m.  She states that she "has white coating all in my mouth and it feels like it does all through and my throat and entire body.  It just burns and burns."  At first she says it feels like a "yeast infection" and then describes it as "reflux."  She is able to eat soft goods.  She then states she feels her Dexilant would help her.  Please advise,  Cyril Mourning

## 2019-01-23 NOTE — Telephone Encounter (Signed)
First follow up call attempt.  No answer or option to leave a message.

## 2019-01-26 ENCOUNTER — Telehealth: Payer: Self-pay | Admitting: *Deleted

## 2019-01-26 NOTE — Telephone Encounter (Signed)
Tried to reach patient again this am to follow up on her rx that Dr. Lyndel Safe called in on Friday. Still reaching a busy signal unfortunately. SM

## 2019-01-26 NOTE — Telephone Encounter (Signed)
Pt is returning your call  [phone]

## 2019-01-26 NOTE — Telephone Encounter (Signed)
Patient called back this afternoon returning my call but unable to reach her when I called back. Just checking to see if she had picked her rx and was doing better over the weekend. Unable to leave message, no voicemail.

## 2019-02-23 ENCOUNTER — Other Ambulatory Visit: Payer: Self-pay | Admitting: Gastroenterology

## 2019-02-23 ENCOUNTER — Encounter: Payer: Self-pay | Admitting: Nutrition

## 2019-02-23 ENCOUNTER — Encounter: Payer: Medicare Other | Attending: Endocrinology | Admitting: Nutrition

## 2019-02-23 ENCOUNTER — Other Ambulatory Visit: Payer: Self-pay

## 2019-02-23 VITALS — Ht 63.5 in | Wt 154.0 lb

## 2019-02-23 DIAGNOSIS — E118 Type 2 diabetes mellitus with unspecified complications: Secondary | ICD-10-CM | POA: Diagnosis present

## 2019-02-23 DIAGNOSIS — E1165 Type 2 diabetes mellitus with hyperglycemia: Secondary | ICD-10-CM | POA: Insufficient documentation

## 2019-02-23 DIAGNOSIS — IMO0002 Reserved for concepts with insufficient information to code with codable children: Secondary | ICD-10-CM

## 2019-02-23 NOTE — Progress Notes (Signed)
  Medical Nutrition Therapy:  Appt start time: [id number] end time:  1630.   Assessment:  Primary concerns today:  Diabetes Type 2. Sees Dr. Chalmers Cater in [place] for her DM Type 2.  Currently on Creon. Takes Januvia. Hasn't been testing blood sugars. Willing to work on diet and testing blood sugars to improve DM.  Preferred Learning Style:     No preference indicated   Learning Readiness:    Ready  Change in progress   MEDICATIONS:    DIETARY INTAKE:  .     Eats 2-3 meals per day. Sometimes skips from not being hungry.   Usual physical activity: ADL  Estimated energy needs: 1500  calories 170 g carbohydrates 112 g protein 42 g fat  Progress Towards Goal(s):  In progress.   Nutritional Diagnosis:  NB-1.1 Food and nutrition-related knowledge deficit As related to Diabetes .  As evidenced by A1C > 6.5%.    Intervention:  Nutrition and Diabetes education provided on My Plate, CHO counting, meal planning, portion sizes, timing of meals, avoiding snacks between meals unless having a low blood sugar, target ranges for A1C and blood sugars, signs/symptoms and treatment of hyper/hypoglycemia, monitoring blood sugars, taking medications as prescribed, benefits of exercising 30 minutes per day and prevention of complications of DM.  Marland KitchenFollow up 1 month  Goals Eat three meals Increase fresh fruits and vegetables Drink only water  Cut out snacks Test blood sugar twice a day   Teaching Method Utilized:  Visual Auditory Hands on  Handouts given during visit include:  The Plate Method   Meal Plan Card  Diabetes Instructions   Barriers to learning/adherence to lifestyle change: none  Demonstrated degree of understanding via:  Teach Back   Monitoring/Evaluation:  Dietary intake, exercise, and body weight in 1 month(s).

## 2019-02-23 NOTE — Patient Instructions (Signed)
Follow up 1 month  Goals Eat three meals Increase fresh fruits and vegetables Drink only water  Cut out snacks Test blood sugar twice a day

## 2019-03-04 ENCOUNTER — Encounter: Payer: Self-pay | Admitting: Nutrition

## 2019-04-07 ENCOUNTER — Ambulatory Visit: Payer: Medicare Other | Admitting: Nutrition

## 2019-05-09 ENCOUNTER — Other Ambulatory Visit: Payer: Self-pay | Admitting: Gastroenterology

## 2019-05-12 ENCOUNTER — Ambulatory Visit: Payer: Medicare Other | Admitting: Nutrition

## 2019-06-03 ENCOUNTER — Telehealth: Payer: Self-pay | Admitting: Gastroenterology

## 2019-06-03 NOTE — Telephone Encounter (Signed)
The pt was advised that she is able to have the COVID vaccine.  She will also speak with her oncology MD as well.

## 2019-06-03 NOTE — Telephone Encounter (Signed)
Pt wants to know if it is recommended for pt to have a Covid-19 vaccine. Pt stated that she will ask her oncologist but wants to know Dr. Ardis Hughs' opinion, too. Per pt, it is ok to leave a detailed phone message in case she cannot answer the phone.

## 2019-06-18 ENCOUNTER — Ambulatory Visit: Payer: Medicare Other | Attending: Internal Medicine

## 2019-06-18 ENCOUNTER — Other Ambulatory Visit: Payer: Self-pay

## 2019-06-18 DIAGNOSIS — Z23 Encounter for immunization: Secondary | ICD-10-CM | POA: Insufficient documentation

## 2019-06-18 NOTE — Progress Notes (Signed)
   Covid-19 Vaccination Clinic  Name:  Tiffany Ortiz    MRN: [id number] DOB: [birth year]  06/18/2019  [name] was observed post Covid-19 immunization for 15 minutes without incidence. She was provided with Vaccine Information Sheet and instruction to access the V-Safe system.   [name] was instructed to call 911 with any severe reactions post vaccine: Marland Kitchen Difficulty breathing  . Swelling of your face and throat  . A fast heartbeat  . A bad rash all over your body  . Dizziness and weakness    Immunizations Administered    Name Date Dose VIS Date Route   Moderna COVID-19 Vaccine 06/18/2019 10:26 AM 0.5 mL 04/07/2019 Intramuscular   Manufacturer: Moderna   Lot: [id number]   [id number]

## 2019-07-10 ENCOUNTER — Ambulatory Visit: Payer: No Typology Code available for payment source | Admitting: Oncology

## 2019-07-10 ENCOUNTER — Telehealth: Payer: Self-pay | Admitting: Oncology

## 2019-07-10 ENCOUNTER — Other Ambulatory Visit: Payer: No Typology Code available for payment source

## 2019-07-10 ENCOUNTER — Encounter: Payer: Self-pay | Admitting: *Deleted

## 2019-07-10 NOTE — Telephone Encounter (Signed)
Scheduled appts per 3/5 sch msg. Left voicemail with appt details. Mailed reminder letter and calender

## 2019-07-10 NOTE — Progress Notes (Signed)
"

## 2019-07-20 ENCOUNTER — Ambulatory Visit: Payer: Medicare Other | Attending: Internal Medicine

## 2019-07-20 DIAGNOSIS — Z23 Encounter for immunization: Secondary | ICD-10-CM

## 2019-07-20 NOTE — Progress Notes (Signed)
   Covid-19 Vaccination Clinic  Name:  Tiffany Ortiz    MRN: [id number] DOB: Feb 17, 1946  07/20/2019  [name] was observed post Covid-19 immunization for 15 minutes without incident. She was provided with Vaccine Information Sheet and instruction to access the V-Safe system.   [name] was instructed to call 911 with any severe reactions post vaccine: Marland Kitchen Difficulty breathing  . Swelling of face and throat  . A fast heartbeat  . A bad rash all over body  . Dizziness and weakness   Immunizations Administered    Name Date Dose VIS Date Route   Moderna COVID-19 Vaccine 07/20/2019 10:47 AM 0.5 mL 04/07/2019 Intramuscular   Manufacturer: Moderna   Lot: [id number]   [id number]

## 2019-07-30 ENCOUNTER — Emergency Department (HOSPITAL_COMMUNITY)
Admission: EM | Admit: 2019-07-30 | Discharge: 2019-07-30 | Disposition: A | Discharge status: Home / Self Care | Payer: Medicare Other | Attending: Emergency Medicine | Admitting: Emergency Medicine

## 2019-07-30 ENCOUNTER — Emergency Department (HOSPITAL_COMMUNITY): Payer: Medicare Other

## 2019-07-30 ENCOUNTER — Encounter (HOSPITAL_COMMUNITY): Payer: Self-pay | Admitting: *Deleted

## 2019-07-30 ENCOUNTER — Other Ambulatory Visit: Payer: Self-pay

## 2019-07-30 DIAGNOSIS — Y929 Unspecified place or not applicable: Secondary | ICD-10-CM | POA: Diagnosis not present

## 2019-07-30 DIAGNOSIS — Y999 Unspecified external cause status: Secondary | ICD-10-CM | POA: Diagnosis not present

## 2019-07-30 DIAGNOSIS — Z87891 Personal history of nicotine dependence: Secondary | ICD-10-CM | POA: Insufficient documentation

## 2019-07-30 DIAGNOSIS — Z7984 Long term (current) use of oral hypoglycemic drugs: Secondary | ICD-10-CM | POA: Insufficient documentation

## 2019-07-30 DIAGNOSIS — Z85028 Personal history of other malignant neoplasm of stomach: Secondary | ICD-10-CM | POA: Diagnosis not present

## 2019-07-30 DIAGNOSIS — S0990XA Unspecified injury of head, initial encounter: Secondary | ICD-10-CM | POA: Diagnosis present

## 2019-07-30 DIAGNOSIS — Z23 Encounter for immunization: Secondary | ICD-10-CM | POA: Insufficient documentation

## 2019-07-30 DIAGNOSIS — E119 Type 2 diabetes mellitus without complications: Secondary | ICD-10-CM | POA: Diagnosis not present

## 2019-07-30 DIAGNOSIS — W01198A Fall on same level from slipping, tripping and stumbling with subsequent striking against other object, initial encounter: Secondary | ICD-10-CM | POA: Diagnosis not present

## 2019-07-30 DIAGNOSIS — Y939 Activity, unspecified: Secondary | ICD-10-CM | POA: Diagnosis not present

## 2019-07-30 DIAGNOSIS — I10 Essential (primary) hypertension: Secondary | ICD-10-CM | POA: Insufficient documentation

## 2019-07-30 DIAGNOSIS — Z79899 Other long term (current) drug therapy: Secondary | ICD-10-CM | POA: Insufficient documentation

## 2019-07-30 DIAGNOSIS — S0101XA Laceration without foreign body of scalp, initial encounter: Secondary | ICD-10-CM | POA: Diagnosis not present

## 2019-07-30 MED ORDER — TETANUS-DIPHTH-ACELL PERTUSSIS 5-2.5-18.5 LF-MCG/0.5 IM SUSP
0.5000 mL | Freq: Once | INTRAMUSCULAR | Status: AC
  Administered 2019-07-30: 0.5 mL via INTRAMUSCULAR
  Filled 2019-07-30: qty 0.5

## 2019-07-30 MED ORDER — BACITRACIN-NEOMYCIN-POLYMYXIN 400-5-5000 EX OINT
TOPICAL_OINTMENT | Freq: Once | CUTANEOUS | Status: AC
  Administered 2019-07-30: 1 via TOPICAL
  Filled 2019-07-30: qty 1

## 2019-07-30 MED ORDER — LIDOCAINE-EPINEPHRINE (PF) 2 %-1:200000 IJ SOLN
10.0000 mL | Freq: Once | INTRAMUSCULAR | Status: AC
  Administered 2019-07-30: 10 mL via INTRADERMAL
  Filled 2019-07-30: qty 10

## 2019-07-30 NOTE — ED Triage Notes (Signed)
Pt tripped and fell striking the edge of door with her head, small lac to scalp, bleeding controlled, pt denies blood thinners and denies LOC.

## 2019-07-30 NOTE — Discharge Instructions (Addendum)
Have the stitches taken out in 10 to 14 days

## 2019-07-30 NOTE — ED Provider Notes (Signed)
[facility] EMERGENCY DEPARTMENT Provider Note   CSN: [id number] Arrival date & time: 07/30/19  1613     History Chief Complaint  Patient presents with  . Fall    Tiffany Ortiz is a 74 y.o. female.  HPI Patient presents after hitting her head.  States she tripped and fell and struck her head on the corner of a door.  Laceration to right temple area.  States she was dizzy and mildly confused after.  Not on blood thinners.  No neck pain.  No other injury.  No numbness weakness.  States she feels less dizzy now.  Unknown last tetanus.    Past Medical History:  Diagnosis Date  . Allergy   . Anxiety   . Arthritis   . Cancer [facility])    carcinoid tumor stomach  . Cataract    REMOVED BILATERAL  . Depression   . Diabetes mellitus   . Fibromyalgia   . GERD (gastroesophageal reflux disease)   . Headache(784.0)   . Hyperlipidemia   . Hyperplastic colon polyp 02/04/2013  . Hypertension   . IBS (irritable bowel syndrome)   . Osteoporosis   . Polyp of rectum 02/04/2013  . Renal disorder   . Shortness of breath   . Sleep apnea   . Vitamin D deficiency     Patient Active Problem List   Diagnosis Date Noted  . Pancreatic insufficiency 10/26/2016  . Diarrhea 10/26/2016  . Carcinoid tumor of intestine 03/31/2013  . Nonspecific (abnormal) findings on radiological and other examination of gastrointestinal tract 02/12/2013  . Chronic diarrhea 01/28/2013  . GERD (gastroesophageal reflux disease) 01/28/2013  . Esophageal dysphagia 01/28/2013    Past Surgical History:  Procedure Laterality Date  . ABDOMINAL HYSTERECTOMY     Cervical cancer, with incidental appendectomy  . APPENDECTOMY    . BIOPSY N/A 02/04/2013   Procedure: BIOPSY;  Surgeon: Daneil Dolin, MD;  Location: [facility] ENDO SUITE;  Service: Endoscopy;  Laterality: N/A;  SB BX AND RANDOM COLON BX  . bladder tack    . BREAST LUMPECTOMY     benign  . COLONOSCOPY  09/22/2003   RMR: Diminutive rectal polyps cold  biopsy/removed.  Otherwise normal rectal mucosa/Pancolonic diverticula.  The remainder of the colonic mucosa appeared normal  . COLONOSCOPY WITH ESOPHAGOGASTRODUODENOSCOPY (EGD) N/A 02/04/2013   Procedure: COLONOSCOPY WITH ESOPHAGOGASTRODUODENOSCOPY (EGD);  Surgeon: Daneil Dolin, MD;  Location: [facility] ENDO SUITE;  Service: Endoscopy;  Laterality: N/A;  10:15-moved to 9:30am  . ESOPHAGOGASTRODUODENOSCOPY  11/19/2006   [id number] Schatzki's ring with overlying distal esophageal erosions consistent with erosive reflux esophagitis, status post dilation and disruption of ring as described above/ Small hiatal hernia/Otherwise normal stomach, first duodenum and second duodenum  . EUS N/A 02/12/2013   Procedure: UPPER ENDOSCOPIC ULTRASOUND (EUS) LINEAR;  Surgeon: Milus Banister, MD;  Location: [facility] ENDOSCOPY;  Service: Endoscopy;  Laterality: N/A;  . EUS N/A 08/20/2013   Procedure: UPPER ENDOSCOPIC ULTRASOUND (EUS) LINEAR;  Surgeon: Milus Banister, MD;  Location: [facility] ENDOSCOPY;  Service: Endoscopy;  Laterality: N/A;  . LAPAROSCOPIC SMALL BOWEL RESECTION N/A 03/31/2013   Procedure: LAPAROSCOPIC  ASSISTED CONVERTED TO OPEN RESECTION DUODENAL MASS/UPPER ENDOSCOPY;  Surgeon: Stark Klein, MD;  Location: [facility] ORS;  Service: General;  Laterality: N/A;  . TONSILLECTOMY       OB History   No obstetric history on file.     Family History  Problem Relation Age of Onset  . Cancer Mother        ?  ovarian  . Cancer Father   . Colon cancer Neg Hx   . Colon polyps Neg Hx   . Esophageal cancer Neg Hx   . Rectal cancer Neg Hx   . Stomach cancer Neg Hx     Social History   Tobacco Use  . Smoking status: Former Smoker    Types: Cigarettes  . Smokeless tobacco: Former Systems developer    Quit date: 02/11/1970  Substance Use Topics  . Alcohol use: No  . Drug use: No    Home Medications Prior to Admission medications   Medication Sig Start Date End Date Taking? Authorizing Provider  amLODipine (NORVASC) 5 MG tablet  Take 5 mg by mouth daily.    [provider]  chlorthalidone (HYGROTON) 25 MG tablet chlorthalidone 25 mg tablet    [provider]  Cholecalciferol (VITAMIN D PO) Take 5,000 Units by mouth once a week. Mondays    [provider]  clonazePAM (KLONOPIN) 1 MG tablet Take 1 mg by mouth daily. AT BEDTIME    [provider]  Coenzyme Q10 (CO Q-10 PO) Take 1 tablet by mouth daily.    [provider]  CREON 12000 units CPEP capsule TAKE 2 CAPSULES DAILY WITH EACH MEAL AND 1 CAPSULE WITH A SNACK (2 PER DAY). 05/13/19   Milus Banister, MD  Cyanocobalamin (VITAMIN B-12 PO) Take 1 tablet by mouth daily.    [provider]  dexlansoprazole (DEXILANT) 60 MG capsule Take 1 capsule (60 mg total) by mouth daily. 01/23/19   Jackquline Denmark, MD  diphenoxylate-atropine (LOMOTIL) 2.5-0.025 MG tablet diphenoxylate-atropine 2.5 mg-0.025 mg tablet    [provider]  fexofenadine (ALLEGRA) 30 MG tablet Take 30 mg by mouth 2 (two) times daily.    [provider]  gabapentin (NEURONTIN) 300 MG capsule Take 300 mg by mouth 2 (two) times daily.     [provider]  GLUCOSAMINE-CHONDROIT-VIT C-MN PO Take by mouth daily.    [provider]  hyoscyamine (LEVBID) 0.375 MG 12 hr tablet Take 0.375 mg by mouth 2 (two) times daily.    [provider]  Inulin (FIBER CHOICE FRUITY BITES) 1.5 g CHEW Chew 1 tablet by mouth daily.    [provider]  JANUVIA 50 MG tablet  12/26/18   [provider]  loperamide (IMODIUM A-D) 2 MG tablet Take 2 mg by mouth as needed for diarrhea or loose stools.    [provider]  Multiple Vitamin (MULTIVITAMIN) tablet Take 1 tablet by mouth daily. With Vit D    [provider]  Nutritional Supplements (DHEA PO) Take 1 tablet by mouth at bedtime.    [provider]  nystatin (MYCOSTATIN) 100000 UNIT/ML suspension Take 10 ML four times a day for 3 days 01/23/19   Jackquline Denmark, MD  Omega-3 Fatty Acids (OMEGA 3 PO) Take 1 capsule by mouth daily. With Vit D    [provider]  oxymetazoline (AFRIN) 0.05 % nasal spray Place 1 spray into both nostrils 2 (two) times daily as needed for congestion.    [provider]  Potassium 99 MG TABS Take by mouth.    [provider]  potassium chloride SA (K-DUR) 20 MEQ tablet potassium chloride ER 20 mEq tablet,extended release(part/cryst)    [provider]  pravastatin (PRAVACHOL) 10 MG tablet Take 20 mg by mouth every other day.     [provider]  Probiotic Product (PROBIOTIC PO) Take 1 tablet by mouth daily.  [provider]  Propylene Glycol-Glycerin (SOOTHE OP) Place 1 drop into both eyes 4 (four) times daily.     [provider]  RESTASIS 0.05 % ophthalmic emulsion  11/24/18   [provider]  triamcinolone cream (KENALOG) 0.1 % Apply 1 application topically 2 (two) times daily.    [provider]  valACYclovir (VALTREX) 500 MG tablet Take 500 mg by mouth as needed. Reported on 10/31/2015 12/30/13   [provider]    Allergies    Other, Asa [aspirin], Erythromycin, Famciclovir, Hydrocodone, Ibuprofen, Lactase, Lactose intolerance (gi), Morphine and related, Oxycodone, Promethazine, and Tylenol [acetaminophen]  Review of Systems   Review of Systems  Constitutional: Negative for appetite change.  Respiratory: Negative for shortness of breath.   Cardiovascular: Negative for chest pain.  Gastrointestinal: Negative for abdominal distention.  Genitourinary: Negative for flank pain.  Musculoskeletal: Negative for back pain and neck pain.  Skin: Positive for wound.  Neurological: Positive for dizziness. Negative for headaches.  Psychiatric/Behavioral: Positive for confusion.    Physical Exam Updated Vital Signs BP (!) 155/71 (BP Location: Right Arm)   Pulse 62   Temp (!) 96.1 F (35.6 C) (Temporal)   Resp 14   Ht 5\' 3"   (1.6 m)   Wt 70.8 kg   SpO2 98%   BMI 27.63 kg/m   Physical Exam Vitals reviewed.  Constitutional:      Appearance: Normal appearance.  HENT:     Head: Normocephalic.     Comments: Approximate 2 cm laceration right temple area.    Right Ear: Tympanic membrane normal.  Eyes:     Extraocular Movements: Extraocular movements intact.     Pupils: Pupils are equal, round, and reactive to light.  Cardiovascular:     Rate and Rhythm: Regular rhythm.  Pulmonary:     Breath sounds: No wheezing or rhonchi.  Chest:     Chest wall: No tenderness.  Abdominal:     Tenderness: There is no abdominal tenderness.  Musculoskeletal:     Cervical back: Neck supple.     Comments: No neck tenderness.  Painless range of motion.  No extremity tenderness.  Skin:    General: Skin is warm.     Capillary Refill: Capillary refill takes less than 2 seconds.  Neurological:     Mental Status: She is alert and oriented to person, place, and time.     ED Results / Procedures / Treatments   Labs (all labs ordered are listed, but only abnormal results are displayed) Labs Reviewed - No data to display  EKG None  Radiology No results found.  Procedures .Marland KitchenLaceration Repair  Date/Time: 07/30/2019 8:38 PM Performed by: Davonna Belling, MD Authorized by: Davonna Belling, MD   Consent:    Consent obtained:  Verbal   Consent given by:  Patient   Risks discussed:  Infection, need for additional repair, retained foreign body, tendon damage, vascular damage and nerve damage   Alternatives discussed:  No treatment Anesthesia (see MAR for exact dosages):    Anesthesia method:  Local infiltration   Local anesthetic:  Lidocaine 2% WITH epi Laceration details:    Location:  Scalp   Scalp location:  R temporal   Length (cm):  2 Pre-procedure details:    Preparation:  Patient was prepped and draped in usual sterile fashion Exploration:    Hemostasis achieved with:  Epinephrine   Wound exploration:  wound explored through full range of motion     Contaminated: no   Treatment:  Area cleansed with:  Saline   Amount of cleaning:  Standard   Irrigation method:  Syringe Skin repair:    Repair method:  Sutures   Suture size:  4-0   Suture material:  Prolene   Suture technique:  Simple interrupted   Number of sutures:  4 Approximation:    Approximation:  Close Post-procedure details:    Dressing:  Antibiotic ointment   Patient tolerance of procedure:  Tolerated well, no immediate complications   (including critical care time)  Medications Ordered in ED Medications  lidocaine-EPINEPHrine (XYLOCAINE W/EPI) 2 %-1:200000 (PF) injection 10 mL (10 mLs Intradermal Given 07/30/19 1913)  Tdap (BOOSTRIX) injection 0.5 mL (0.5 mLs Intramuscular Given 07/30/19 1913)    ED Course  I have reviewed the triage vital signs and the nursing notes.  Pertinent labs & imaging results that were available during my care of the patient were reviewed by me and considered in my medical decision making (see chart for details).    MDM Rules/Calculators/A&P                      Patient presents after striking her head and a fall.  Had been dazed.  Head CT done reassuring.  Wound closed.  Discharge home.  Tetanus updated.  No other apparent injury. Final Clinical Impression(s) / ED Diagnoses Final diagnoses:  None    Rx / DC Orders ED Discharge Orders    None       Davonna Belling, MD 07/30/19 2039

## 2019-08-28 ENCOUNTER — Other Ambulatory Visit: Payer: Self-pay | Admitting: *Deleted

## 2019-08-28 DIAGNOSIS — D3A098 Benign carcinoid tumors of other sites: Secondary | ICD-10-CM

## 2019-08-31 ENCOUNTER — Other Ambulatory Visit: Payer: Self-pay

## 2019-08-31 ENCOUNTER — Inpatient Hospital Stay: Payer: Medicare Other

## 2019-08-31 ENCOUNTER — Inpatient Hospital Stay: Payer: Medicare Other | Attending: Oncology | Admitting: Oncology

## 2019-08-31 VITALS — BP 143/56 | HR 82 | Temp 98.3°F | Resp 17 | Ht 63.0 in | Wt 157.3 lb

## 2019-08-31 DIAGNOSIS — G4730 Sleep apnea, unspecified: Secondary | ICD-10-CM | POA: Insufficient documentation

## 2019-08-31 DIAGNOSIS — D3A098 Benign carcinoid tumors of other sites: Secondary | ICD-10-CM | POA: Diagnosis not present

## 2019-08-31 DIAGNOSIS — K219 Gastro-esophageal reflux disease without esophagitis: Secondary | ICD-10-CM | POA: Insufficient documentation

## 2019-08-31 DIAGNOSIS — M797 Fibromyalgia: Secondary | ICD-10-CM | POA: Diagnosis not present

## 2019-08-31 DIAGNOSIS — R197 Diarrhea, unspecified: Secondary | ICD-10-CM | POA: Insufficient documentation

## 2019-08-31 DIAGNOSIS — E119 Type 2 diabetes mellitus without complications: Secondary | ICD-10-CM | POA: Insufficient documentation

## 2019-08-31 DIAGNOSIS — C7A010 Malignant carcinoid tumor of the duodenum: Secondary | ICD-10-CM | POA: Insufficient documentation

## 2019-08-31 DIAGNOSIS — I10 Essential (primary) hypertension: Secondary | ICD-10-CM | POA: Diagnosis not present

## 2019-08-31 DIAGNOSIS — L80 Vitiligo: Secondary | ICD-10-CM | POA: Diagnosis not present

## 2019-08-31 NOTE — Patient Instructions (Signed)
Please provide a copy of your medical advanced directive when possible to scan into your records.

## 2019-08-31 NOTE — Progress Notes (Signed)
Tiffany Ortiz OFFICE PROGRESS NOTE   Diagnosis: Carcinoid tumor  INTERVAL HISTORY:   Tiffany Ortiz returns for a scheduled visit.  She underwent an upper endoscopy by Dr. Ardis Hughs on 01/21/2019.  A benign-appearing stricture was dilated at the GE junction.  The duodenal nodule appeared unchanged.  There was evidence of gastritis in the gastric antrum. She reports sore swallowing did not improve following the dilation compared to previous procedures.  She has increased reflux symptoms.  She had constipation last week.  She does not have consistent diarrhea.  Overall she reports improvement in diarrhea.  Good appetite.  She has bilateral knee arthritis and is followed by orthopedics.  Objective:  Vital signs in last 24 hours:  Blood pressure (!) 143/56, pulse 82, temperature 98.3 F (36.8 C), temperature source Temporal, resp. rate 17, height 5\' 3"  (1.6 m), weight 157 lb 4.8 oz (71.4 kg), SpO2 100 %.    Lymphatics: No cervical, supraclavicular, axillary, or inguinal nodes Cardio: Regular rate and rhythm, no murmur or gallop GI: No hepatosplenomegaly, no apparent ascites, no mass, nontender Vascular: No leg edema   Lab Results:  Lab Results  Component Value Date   WBC 9.9 04/05/2013   HGB 10.7 (L) 04/05/2013   HCT 31.9 (L) 04/05/2013   MCV 87.9 04/05/2013   PLT 307 04/05/2013    CMP  Lab Results  Component Value Date   NA 141 10/31/2015   K 3.1 (L) 10/31/2015   CL 109 10/31/2015   CO2 24 10/31/2015   GLUCOSE 119 (H) 10/31/2015   BUN 27 (H) 10/31/2015   CREATININE 1.08 10/31/2015   CALCIUM 10.0 10/31/2015   ALBUMIN 4.0 12/01/2012   AST 17 12/01/2012   ALT 19 12/01/2012   ALKPHOS 107.0 12/01/2012   BILITOT 0.3 12/01/2012   GFRNONAA 71 (L) 04/05/2013   GFRAA 83 (L) 04/05/2013    No results found for: CEA1  No results found for: INR  Imaging:  No results found.  Medications: I have reviewed the patient's current medications.   Assessment/Plan:  1.  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, clinical stage III (uT2, pN1), status post an endoscopic biopsy 02/04/2013 confirming a duodenal carcinoid tumor, status post a duodenal resection 03/31/2013 with no remaining tumor found in the duodenum and a positive portal lymph node   Repeat upper endoscopy/EUS on 08/20/2013 confirming a persistent duodenal nodule with a biopsy confirming a low-grade neuroendocrine carcinoma  Repeat upper endoscopy 09/27/2014 with a duodenal nodule proximal to a surgical scar, biopsy confirmed a well-differentiated neuroendocrine tumor  Upper endoscopy 04/20/2016-benign-appearing esophageal stenosis, dilated.  Previously known duodenal bulb carcinoid unchanged.  Biopsy well-differentiated neuroendocrine tumor.  Upper endoscopy 01/21/2019-unchanged duodenal nodule 2. Diarrhea-intermittent-improved 3. Diabetes  4. Sleep apnea  5. Fibromyalgia  6. Hypertension  7. Gastroesophageal reflux disease  8. Status post dilatation of a Schatzki's ring 02/04/2013  9. Vitiligo and hyperpigmented skin rash-evaluated by dermatology    Disposition: Tiffany Ortiz appears unchanged.  There is no clinical evidence for progression of the carcinoid tumor.  The duodenal nodule was unchanged on endoscopy in September 2020.  We will follow up on the chromogranin A level from today, though Tiffany Ortiz reports she did not stop Dexilant prior to the blood draw.  She would like to continue follow-up at the Cancer center.  She will return for an office visit and chromogranin a level in 1 year. Tiffany Ortiz will see Dr. Ardis Hughs to evaluate the reflux symptoms.  Betsy Coder, MD  08/31/2019  11:50 AM

## 2019-09-01 ENCOUNTER — Telehealth: Payer: Self-pay | Admitting: Oncology

## 2019-09-01 LAB — CHROMOGRANIN A: Chromogranin A (ng/mL): 948.5 ng/mL — ABNORMAL HIGH (ref 0.0–101.8)

## 2019-09-01 NOTE — Telephone Encounter (Signed)
Scheduled per los. Called and left msg. Mailed printout  °

## 2019-09-02 ENCOUNTER — Telehealth: Payer: Self-pay | Admitting: *Deleted

## 2019-09-02 DIAGNOSIS — D3A098 Benign carcinoid tumors of other sites: Secondary | ICD-10-CM

## 2019-09-02 NOTE — Telephone Encounter (Signed)
Informed of higher chromogranin level, but could be elevated due to not holding the [place]. She confirms she forgot to hold it. Scheduler will call her for recheck lab in 1 month and reminded her to hold her Dexilant x 5 days before the blood draw. She understands and agrees.

## 2019-09-02 NOTE — Telephone Encounter (Signed)
-----   Message from Tiffany Pier, MD sent at 09/01/2019  5:40 PM EDT ----- Please call patient, chromogranin a level is mildly elevated, hold Dexilant for 5 days and repeat within the next month

## 2019-10-26 ENCOUNTER — Inpatient Hospital Stay: Payer: Medicare Other | Attending: Oncology

## 2019-10-26 ENCOUNTER — Other Ambulatory Visit: Payer: Self-pay

## 2019-10-26 DIAGNOSIS — D3A098 Benign carcinoid tumors of other sites: Secondary | ICD-10-CM

## 2019-10-26 DIAGNOSIS — C7A010 Malignant carcinoid tumor of the duodenum: Secondary | ICD-10-CM | POA: Diagnosis not present

## 2019-10-27 ENCOUNTER — Encounter: Payer: Self-pay | Admitting: Gastroenterology

## 2019-10-27 ENCOUNTER — Ambulatory Visit (INDEPENDENT_AMBULATORY_CARE_PROVIDER_SITE_OTHER): Payer: Medicare Other | Admitting: Gastroenterology

## 2019-10-27 VITALS — BP 154/66 | HR 84 | Ht 61.5 in | Wt 152.8 lb

## 2019-10-27 DIAGNOSIS — D3A010 Benign carcinoid tumor of the duodenum: Secondary | ICD-10-CM | POA: Diagnosis not present

## 2019-10-27 LAB — CHROMOGRANIN A: Chromogranin A (ng/mL): 520.9 ng/mL — ABNORMAL HIGH (ref 0.0–101.8)

## 2019-10-27 NOTE — Patient Instructions (Addendum)
If you are age 74 or older, your body mass index should be between 23-30. Your Body mass index is 28.4 kg/m. If this is out of the aforementioned range listed, please consider follow up with your Primary Care Provider.  If you are age 66 or younger, your body mass index should be between 19-25. Your Body mass index is 28.4 kg/m. If this is out of the aformentioned range listed, please consider follow up with your Primary Care Provider.   Please continue taking Dexilant with no interruptions.  You have been scheduled for a NET Spot Scan at [facility], 1st floor Radiology. You are scheduled on 11-12-19  at 3:00pm. You should arrive 30 minutes prior to your appointment time for registration.   * Do not eat or drink anything 2 hours prior to appointment.  Thank you for entrusting me with your care and choosing [facility].  Dr Ardis Hughs

## 2019-10-27 NOTE — Progress Notes (Signed)
Review of pertinent gastrointestinal problems: 1. Chronic diarrhea:Colonoscopy Dr. Gala Romney 02/2013: pan diverticulosis, normal TI, biopsies randomly from colon were normal, a few small HPs were removed. 01/2013 celiac panel, TSH were both normal. 2018 dicyclomine works very well for her diarrhea 2. 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 Gala Romney), clinical stage III (uT2, pN1), status post an endoscopic biopsy 02/04/2013 confirming a duodenal carcinoid tumor, status post a duodenal resection 03/31/2013 Dr. Barry Dienes with no remaining tumor found in the duodenum and a positive portal lymph node; Repeat upper endoscopy/EUS on 08/20/2013 confirming a persistent duodenal nodule with a biopsy confirming a low-grade neuroendocrine carcinoma. EGD 2016 Dr. Ardis Hughs, same; the previously noted duodenal nodule was still present in the duodenum, this is carcinoid and it was probably never removed during her duodenal resection surgery. EGD Dr. Ardis Hughs 04/2016: same nodule (path showed carcinoid again). Also benign appearing GE junction stricture was dilated to 20 mm  Follows with Dr. Julieanne Manson, last OV 08/2019 3. EUS 10/2014above also noted changes consistent withchronic pancreatitis 4.Dysphasia, benign GE junction stricture. Dilated 12 2017 to 20 mm with balloon.  EGD September 2020 focal peptic stricture again noted, dilated to 18 mm.  Small hiatal hernia, moderate gastritis was biopsied.  The previously well-documented duodenal nodule was unchanged.   HPI: This is a pleasant 74 year old woman whom I last saw at the time of EGD September 2020 at which point I dilated a focal peptic stricture.  Her weight is down 3 pounds since her last office visit here about 1 year ago, same scale.  She has alternating constipation and diarrhea.  She takes a variety of agents that affect her bowels including Imodium, Pepto-Bismol, laxatives.  She has generalized  abdominal pains.  Seem to be worse in the left side.  She has chronic nausea and intermittent globus.  She stopped her Dexilant for the past 3 to 4 weeks in order to get an accurate chromogranin level and her GERD as well as her intermittent dysphagia have worsened since then.  ROS: complete GI ROS as described in HPI, all other review negative.  Constitutional:  No unintentional weight loss   Past Medical History:  Diagnosis Date  . Allergy   . Anxiety   . Arthritis   . Cancer [facility])    carcinoid tumor stomach  . Cataract    REMOVED BILATERAL  . Depression   . Diabetes mellitus   . Fibromyalgia   . GERD (gastroesophageal reflux disease)   . Headache(784.0)   . Hyperlipidemia   . Hyperplastic colon polyp 02/04/2013  . Hypertension   . IBS (irritable bowel syndrome)   . Osteoporosis   . Polyp of rectum 02/04/2013  . Renal disorder   . Shortness of breath   . Sleep apnea   . Vitamin D deficiency     Past Surgical History:  Procedure Laterality Date  . ABDOMINAL HYSTERECTOMY     Cervical cancer, with incidental appendectomy  . APPENDECTOMY    . BIOPSY N/A 02/04/2013   Procedure: BIOPSY;  Surgeon: Daneil Dolin, MD;  Location: [facility] END[facility];  Service: Endoscopy;  Laterality: N/A;  SB BX AND RANDOM COLON BX  . bladder tack    . BREAST LUMPECTOMY     benign  . COLONOSCOPY  09/22/2003   RMR: Diminutive rectal polyps cold biopsy/removed.  Otherwise normal rectal mucosa/Pancolonic diverticula.  The remainder of the colonic mucosa appeared normal  . COLONOSCOPY WITH ESOPHAGOGASTRODUODENOSCOPY (EGD) N/A 02/04/2013  Procedure: COLONOSCOPY WITH ESOPHAGOGASTRODUODENOSCOPY (EGD);  Surgeon: Daneil Dolin, MD;  Location: [facility] END[facility] SUITE;  Service: Endoscopy;  Laterality: N/A;  10:15-moved to 9:30am  . ESOPHAGOGASTRODUODENOSCOPY  11/19/2006   IHW:[id number] Schatzki's ring with overlying distal esophageal erosions consistent with erosive reflux esophagitis, status post dilation and  disruption of ring as described above/ Small hiatal hernia/Otherwise normal stomach, first duodenum and second duodenum  . EUS N/A 02/12/2013   Procedure: UPPER ENDOSCOPIC ULTRASOUND (EUS) LINEAR;  Surgeon: Milus Banister, MD;  Location: [facility] ENDOSCOPY;  Service: Endoscopy;  Laterality: N/A;  . EUS N/A 08/20/2013   Procedure: UPPER ENDOSCOPIC ULTRASOUND (EUS) LINEAR;  Surgeon: Milus Banister, MD;  Location: [facility] ENDOSCOPY;  Service: Endoscopy;  Laterality: N/A;  . LAPAROSCOPIC SMALL BOWEL RESECTION N/A 03/31/2013   Procedure: LAPAROSCOPIC  ASSISTED CONVERTED TO OPEN RESECTION DUODENAL MASS/UPPER ENDOSCOPY;  Surgeon: Stark Klein, MD;  Location: [facility] ORS;  Service: General;  Laterality: N/A;  . TONSILLECTOMY      Current Outpatient Medications  Medication Sig Dispense Refill  . amLODipine (NORVASC) 5 MG tablet Take 5 mg by mouth daily.    . chlorthalidone (HYGROTON) 25 MG tablet chlorthalidone 25 mg tablet    . Cholecalciferol (VITAMIN D PO) Take 5,000 Units by mouth daily. Mondays    . clonazePAM (KLONOPIN) 0.5 MG tablet Take 0.25 mg by mouth 2 (two) times daily as needed.    . Coenzyme Q10 (CO Q-10 PO) Take 1 tablet by mouth daily.    Marland Kitchen CREON 12000 units CPEP capsule TAKE 2 CAPSULES DAILY WITH EACH MEAL AND 1 CAPSULE WITH A SNACK (2 PER DAY). 200 capsule 0  . Cyanocobalamin (VITAMIN B-12 PO) Take 1 tablet by mouth daily.    Marland Kitchen dexlansoprazole (DEXILANT) 60 MG capsule Take 1 capsule (60 mg total) by mouth daily. 30 capsule 5  . diphenoxylate-atropine (LOMOTIL) 2.5-0.025 MG tablet diphenoxylate-atropine 2.5 mg-0.025 mg tablet    . fexofenadine (ALLEGRA) 30 MG tablet Take 30 mg by mouth daily.     Marland Kitchen gabapentin (NEURONTIN) 300 MG capsule Take 300 mg by mouth 2 (two) times daily.     Marland Kitchen glipiZIDE (GLUCOTROL XL) 2.5 MG 24 hr tablet Take 2.5 mg by mouth daily.    Marland Kitchen GLUCOSAMINE-CHONDROIT-VIT C-MN PO Take by mouth daily.    . hyoscyamine (LEVBID) 0.375 MG 12 hr tablet Take 0.375 mg by mouth 2 (two) times  daily.    . Inulin (FIBER CHOICE FRUITY BITES) 1.5 g CHEW Chew 1 tablet by mouth daily.    Marland Kitchen JANUVIA 50 MG tablet Take 50 mg by mouth daily.     Marland Kitchen loperamide (IMODIUM A-D) 2 MG tablet Take 2 mg by mouth as needed for diarrhea or loose stools.    . mirtazapine (REMERON) 7.5 MG tablet Take 1-2 tablets by mouth at bedtime.    . Multiple Vitamin (MULTIVITAMIN) tablet Take 1 tablet by mouth daily. With Vit D    . Omega-3 Fatty Acids (OMEGA 3 PO) Take 1 capsule by mouth daily. With Vit D    . oxymetazoline (AFRIN) 0.05 % nasal spray Place 1 spray into both nostrils 2 (two) times daily as needed for congestion.    . potassium chloride SA (K-DUR) 20 MEQ tablet potassium chloride ER 20 mEq tablet,extended release(part/cryst)    . pravastatin (PRAVACHOL) 20 MG tablet Take 20 mg by mouth at bedtime.    . Probiotic Product (PROBIOTIC PO) Take 1 tablet by mouth daily.    Marland Kitchen Propylene Glycol-Glycerin (SOOTHE OP) Place 1  drop into both eyes 2 (two) times daily as needed.     . RESTASIS 0.05 % ophthalmic emulsion Place into both eyes 2 (two) times daily.     . sertraline (ZOLOFT) 50 MG tablet Take 50 mg by mouth daily.    Marland Kitchen triamcinolone cream (KENALOG) 0.1 % Apply 1 application topically 2 (two) times daily.    . valACYclovir (VALTREX) 500 MG tablet Take 500 mg by mouth as needed. Reported on 10/31/2015     No current facility-administered medications for this visit.    Allergies as of 10/27/2019 - Review Complete 10/27/2019  Allergen Reaction Noted  . Other  01/28/2013  . Asa [aspirin]  02/06/2013  . Erythromycin  01/28/2013  . Famciclovir  02/11/2013  . Hydrocodone  02/04/2013  . Ibuprofen  02/06/2013  . Lactase Diarrhea 11/30/2013  . Lactose intolerance (gi) Diarrhea and Other (See Comments) 03/31/2013  . Morphine and related Other (See Comments) 09/27/2014  . Oxycodone  01/28/2013  . Promethazine Nausea And Vomiting 01/30/2011  . Tylenol [acetaminophen]  02/06/2013    Family History  Problem  Relation Age of Onset  . Cancer Mother        ?ovarian  . Cancer Father   . Colon cancer Neg Hx   . Colon polyps Neg Hx   . Esophageal cancer Neg Hx   . Rectal cancer Neg Hx   . Stomach cancer Neg Hx     Social History   Socioeconomic History  . Marital status: Married    Spouse name: Not on file  . Number of children: 2  . Years of education: Not on file  . Highest education level: Not on file  Occupational History  . Occupation: retired  Tobacco Use  . Smoking status: Former Smoker    Types: Cigarettes  . Smokeless tobacco: Former Systems developer    Quit date: 02/11/1970  Vaping Use  . Vaping Use: Never used  Substance and Sexual Activity  . Alcohol use: No  . Drug use: No  . Sexual activity: Not on file  Other Topics Concern  . 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 Difficulty of Paying Living Expenses:   Food Insecurity:   . Worried About Charity fundraiser in the Last Year:   . Arboriculturist in the Last Year:   Transportation Needs:   . Film/video editor (Medical):   Marland Kitchen Lack of Transportation (Non-Medical):   Physical Activity:   . Days of Exercise per Week:   . Minutes of Exercise per Session:   Stress:   . Feeling of Stress :   Social Connections:   . Frequency of Communication with Friends and Family:   . Frequency of Social Gatherings with Friends and Family:   . Attends Religious Services:   . Active Member of Clubs or Organizations:   . Attends Archivist Meetings:   Marland Kitchen Marital Status:   Intimate Partner Violence:   . Fear of Current or Ex-Partner:   . Emotionally Abused:   Marland Kitchen Physically Abused:   . Sexually Abused:      Physical Exam: Ht 5' 1.5" (1.562 m)   Wt 152 lb 12.8 oz (69.3 kg)   BMI 28.40 kg/m  Constitutional: generally  well-appearing Psychiatric: alert and  oriented x3 Abdomen: soft, nontender, nondistended, no obvious ascites, no peritoneal signs, normal bowel sounds No peripheral edema noted in lower extremities  Assessment and plan: 74 y.o. female with myriad upper and lower GI symptoms  Is difficult to know how much of her symptoms are functional.  I still have concerns about the carcinoid being still in situ despite surgery many years ago.  Awaiting a repeat chromogranin A level this time off of Dexilant.  Value 2 months ago was double what it had been previously but she was still on her proton pump inhibitor.  I am going to order a Netspot Scan for her.  I think many of her esophageal, throat, GERD related symptoms are due to the fact that she has been off of her Dexilant for the past 3 weeks for her chromogranin testing.  I suspect they will improve back to her baseline over the next several days.  Please see the "Patient Instructions" section for addition details about the plan.  Owens Loffler, MD [facility] 10/27/2019, 11:17 AM   Total time on date of encounter was 30 minutes (this included time spent preparing to see the patient reviewing records; obtaining and/or reviewing separately obtained history; performing a medically appropriate exam and/or evaluation; counseling and educating the patient and family if present; ordering medications, tests or procedures if applicable; and documenting clinical information in the health record).

## 2019-10-28 ENCOUNTER — Telehealth: Payer: Self-pay | Admitting: *Deleted

## 2019-10-28 NOTE — Telephone Encounter (Signed)
Notified of chromogranin results-pretty stable per Dr. Benay Spice. F/U as scheduled. She reports Dr. Ardis Hughs ordered a scan for her in near future--NETSPOT scan.

## 2019-11-11 ENCOUNTER — Telehealth: Payer: Self-pay | Admitting: Gastroenterology

## 2019-11-11 NOTE — Telephone Encounter (Signed)
The pt questions were in regards to the NPO status and arrival time.  The pt has been advised of the information and verbalized understanding.

## 2019-11-12 ENCOUNTER — Other Ambulatory Visit: Payer: Self-pay

## 2019-11-12 ENCOUNTER — Ambulatory Visit (HOSPITAL_COMMUNITY)
Admission: RE | Admit: 2019-11-12 | Discharge: 2019-11-12 | Disposition: A | Discharge status: Home / Self Care | Payer: Medicare Other | Source: Ambulatory Visit | Attending: Gastroenterology | Admitting: Gastroenterology

## 2019-11-12 DIAGNOSIS — D3A010 Benign carcinoid tumor of the duodenum: Secondary | ICD-10-CM | POA: Diagnosis present

## 2019-11-12 DIAGNOSIS — R918 Other nonspecific abnormal finding of lung field: Secondary | ICD-10-CM | POA: Diagnosis not present

## 2019-11-12 MED ORDER — GALLIUM GA 68 DOTATATE IV KIT
4.0200 | PACK | Freq: Once | INTRAVENOUS | Status: AC
  Administered 2019-11-12: 4.02 via INTRAVENOUS

## 2019-11-20 ENCOUNTER — Telehealth: Payer: Self-pay | Admitting: Radiology

## 2019-11-20 NOTE — Telephone Encounter (Signed)
The pt has been advised that as soon as Dr Ardis Hughs is back in the office we will contact her with results The pt has been advised of the information and verbalized understanding.   Tiffany Ortiz

## 2020-07-08 ENCOUNTER — Telehealth: Payer: Self-pay | Admitting: *Deleted

## 2020-07-08 NOTE — Telephone Encounter (Signed)
Left VM that she will be following Dr. Benay Spice to [place] location.

## 2020-07-29 ENCOUNTER — Other Ambulatory Visit: Payer: Self-pay | Admitting: Gastroenterology

## 2020-08-01 ENCOUNTER — Telehealth: Payer: Self-pay | Admitting: Oncology

## 2020-08-01 NOTE — Telephone Encounter (Signed)
I mailed updated appointment calendar to patient w/ change of location  

## 2020-08-30 ENCOUNTER — Telehealth: Payer: Self-pay | Admitting: Oncology

## 2020-08-30 ENCOUNTER — Inpatient Hospital Stay: Payer: Medicare Other | Admitting: Oncology

## 2020-08-30 ENCOUNTER — Telehealth: Payer: Self-pay | Admitting: *Deleted

## 2020-08-30 ENCOUNTER — Inpatient Hospital Stay: Payer: Medicare Other

## 2020-08-30 ENCOUNTER — Other Ambulatory Visit: Payer: Medicare Other

## 2020-08-30 NOTE — Telephone Encounter (Signed)
Called and spoke with patient regarding appointments being rescheduled to May 2022.  She was ok with both date & time.  I also gave her our new address her at [facility]

## 2020-08-30 NOTE — Telephone Encounter (Signed)
"  No show" for appointment today. Called patient and she was not aware of the appointment, but confirms she wants to see Dr. Benay Spice. Scheduling message sent to reschedule for 1 month. Reminded patient to hold her Dexilant for 5 days before her blood test to not interfere with results.

## 2020-09-29 ENCOUNTER — Ambulatory Visit: Payer: Medicare Other | Admitting: Oncology

## 2020-09-29 ENCOUNTER — Other Ambulatory Visit: Payer: Medicare Other

## 2020-10-04 ENCOUNTER — Telehealth: Payer: Self-pay | Admitting: *Deleted

## 2020-10-04 NOTE — Telephone Encounter (Signed)
Patient has held her Dexilant since 5/25 in preparation for labs tomorrow and now having stomach pain/gas/bloating. Asking what she can take. Has been taking Pepto Bismol without much relief. Suggested she try OTC Tums or Gas-X and her Levsin bid (on med list). Offered to have her come in today for labs so she can get back on the [place] and she declines.

## 2020-10-05 ENCOUNTER — Inpatient Hospital Stay: Payer: Medicare Other | Attending: Oncology

## 2020-10-05 ENCOUNTER — Inpatient Hospital Stay (HOSPITAL_BASED_OUTPATIENT_CLINIC_OR_DEPARTMENT_OTHER): Payer: Medicare Other | Admitting: Oncology

## 2020-10-05 ENCOUNTER — Other Ambulatory Visit: Payer: Self-pay | Admitting: Gastroenterology

## 2020-10-05 ENCOUNTER — Other Ambulatory Visit: Payer: Self-pay

## 2020-10-05 VITALS — BP 161/73 | HR 77 | Temp 99.1°F | Resp 16 | Wt 152.0 lb

## 2020-10-05 DIAGNOSIS — C7A010 Malignant carcinoid tumor of the duodenum: Secondary | ICD-10-CM | POA: Insufficient documentation

## 2020-10-05 DIAGNOSIS — K219 Gastro-esophageal reflux disease without esophagitis: Secondary | ICD-10-CM | POA: Insufficient documentation

## 2020-10-05 DIAGNOSIS — M797 Fibromyalgia: Secondary | ICD-10-CM | POA: Diagnosis not present

## 2020-10-05 DIAGNOSIS — R197 Diarrhea, unspecified: Secondary | ICD-10-CM | POA: Insufficient documentation

## 2020-10-05 DIAGNOSIS — D3A098 Benign carcinoid tumors of other sites: Secondary | ICD-10-CM

## 2020-10-05 DIAGNOSIS — E119 Type 2 diabetes mellitus without complications: Secondary | ICD-10-CM | POA: Diagnosis not present

## 2020-10-05 DIAGNOSIS — R1310 Dysphagia, unspecified: Secondary | ICD-10-CM

## 2020-10-05 DIAGNOSIS — I10 Essential (primary) hypertension: Secondary | ICD-10-CM | POA: Diagnosis not present

## 2020-10-05 DIAGNOSIS — R110 Nausea: Secondary | ICD-10-CM | POA: Insufficient documentation

## 2020-10-05 NOTE — Progress Notes (Signed)
  [place] OFFICE PROGRESS NOTE   Diagnosis: Carcinoid tumor  INTERVAL HISTORY:   Ms. Hedstrom returns for a scheduled visit.  She has intermittent diarrhea, but this is not a consistent problem.  She reports increased diarrhea since stopping Dexilant approximately 9 days ago.  She had nausea today.  Good appetite.  She is scheduled to see her primary provider later this week.  Objective:  Vital signs in last 24 hours:  Blood pressure (!) 161/73, pulse 77, temperature 99.1 F (37.3 C), temperature source Oral, resp. rate 16, weight 152 lb (68.9 kg), SpO2 98 %.    Lymphatics: No cervical, supraclavicular, axillary, or inguinal nodes. Resp: Lungs clear bilaterally Cardio: Regular rate and rhythm GI: Soft and nontender, no mass, no hepatosplenomegaly Vascular: No leg edema  Skin: 1 cm mobile cutaneous cystic lesion at the upper abdomen    Lab Results:  Lab Results  Component Value Date   WBC 9.9 04/05/2013   HGB 10.7 (L) 04/05/2013   HCT 31.9 (L) 04/05/2013   MCV 87.9 04/05/2013   PLT 307 04/05/2013    CMP  Lab Results  Component Value Date   NA 141 10/31/2015   K 3.1 (L) 10/31/2015   CL 109 10/31/2015   CO2 24 10/31/2015   GLUCOSE 119 (H) 10/31/2015   BUN 27 (H) 10/31/2015   CREATININE 1.08 10/31/2015   CALCIUM 10.0 10/31/2015   ALBUMIN 4.0 12/01/2012   AST 17 12/01/2012   ALT 19 12/01/2012   ALKPHOS 107.0 12/01/2012   BILITOT 0.3 12/01/2012   GFRNONAA 71 (L) 04/05/2013   GFRAA 83 (L) 04/05/2013     Medications: I have reviewed the patient's current medications.   Assessment/Plan: 1. 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, clinical stage III (uT2, pN1), status post an endoscopic biopsy 02/04/2013 confirming a duodenal carcinoid tumor, status post a duodenal resection 03/31/2013 with no remaining tumor found in the duodenum and a positive portal lymph node   Repeat upper endoscopy/EUS on  08/20/2013 confirming a persistent duodenal nodule with a biopsy confirming a low-grade neuroendocrine carcinoma  Repeat upper endoscopy 09/27/2014 with a duodenal nodule proximal to a surgical scar, biopsy confirmed a well-differentiated neuroendocrine tumor  Upper endoscopy 04/20/2016-benign-appearing esophageal stenosis, dilated.  Previously known duodenal bulb carcinoid unchanged.  Biopsy well-differentiated neuroendocrine tumor.  Upper endoscopy 01/21/2019-unchanged duodenal nodule  Netspot 11/12/2019- focus of intense activity in the region of the second portion of the duodenum, no corresponding lesion on CT, no evidence of metastatic disease, stable right pulmonary nodules compared to 2017 2. Diarrhea-intermittent-improved 3. Diabetes  4. Sleep apnea  5. Fibromyalgia  6. Hypertension  7. Gastroesophageal reflux disease  8. Status post dilatation of a Schatzki's ring 02/04/2013  9. Vitiligo and hyperpigmented skin rash-evaluated by dermatology     Disposition: Ms. Scalici has a history of a duodenal carcinoid tumor.  There is no clinical evidence of disease progression.  We will follow-up on the chromogranin a level from today.  She will follow-up with Dr. Ardis Hughs for diarrhea.  She will see her primary provider later this week and request nausea medication as needed.  Ms. Milan has received the COVID-19 vaccines and 1 booster.  She will consider obtaining another booster vaccine  Betsy Coder, MD  10/05/2020  3:07 PM

## 2020-10-06 LAB — CHROMOGRANIN A: Chromogranin A (ng/mL): 778.7 ng/mL — ABNORMAL HIGH (ref 0.0–101.8)

## 2021-08-22 ENCOUNTER — Ambulatory Visit: Payer: Medicare Other | Admitting: Gastroenterology

## 2021-08-22 NOTE — Progress Notes (Deleted)
Review of pertinent gastrointestinal problems: 1. Chronic diarrhea: Colonoscopy Dr. Gala Romney 02/2013: pan diverticulosis, normal TI, biopsies randomly from colon were normal, a few small HPs were removed. 01/2013 celiac panel, TSH were both normal.  2018 dicyclomine works very well for her diarrhea 2. 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 Gala Romney), clinical stage III (uT2, pN1), status post an endoscopic biopsy 02/04/2013 confirming a duodenal carcinoid tumor, status post a duodenal resection 03/31/2013 Dr. Barry Dienes with no remaining tumor found in the duodenum and a positive portal lymph node;  Repeat upper endoscopy/EUS on 08/20/2013 confirming a persistent duodenal nodule with a biopsy confirming a low-grade neuroendocrine carcinoma. EGD 2016 Dr. Ardis Hughs, same; the previously noted duodenal nodule was still present in the duodenum, this is carcinoid and it was probably never removed during her duodenal resection surgery. EGD Dr. Ardis Hughs 04/2016: same nodule (path showed carcinoid again).  Also benign appearing GE junction stricture was dilated to 20 mm.  Follows with Dr. Julieanne Manson 2021 Netspot scan confirmed continued presence of small duodenal carcinoid, chromogranin A level was elevated. 3. EUS 10/2014above also noted changes consistent with chronic pancreatitis 4.  Dysphasia, benign GE junction stricture.  Dilated 12 2017 to 20 mm with balloon.  EGD September 2020 focal peptic stricture again noted, dilated to 18 mm.  Small hiatal hernia, moderate gastritis was biopsied.  The previously well-documented duodenal nodule was unchanged.

## 2021-10-06 ENCOUNTER — Telehealth: Payer: Self-pay | Admitting: Gastroenterology

## 2021-10-06 NOTE — Telephone Encounter (Signed)
This pt last seen in 2021 has complaints of constipation. She has had to use enemas, miralax and laxatives to have a BM.  She saw her PCP and was advised to follow up with Dr Ardis Hughs.  I have scheduled an appt with Anderson Malta per pt preference on 6/29.  She was offered an appt with Nicoletta Ba but she declined stating she was not happy with the care she received  when she last saw Amy.  I offered NP appt and she also declined.  She agrees to see Anderson Malta and will call if she has worse symptoms.

## 2021-10-06 NOTE — Telephone Encounter (Signed)
Inbound call from  patient stating she is having a hard time using the bathroom. And is seeking advice from the nurse. Please advise.

## 2021-10-10 ENCOUNTER — Inpatient Hospital Stay: Payer: Medicare Other

## 2021-10-10 ENCOUNTER — Inpatient Hospital Stay: Payer: Medicare Other | Admitting: Oncology

## 2021-10-30 ENCOUNTER — Inpatient Hospital Stay: Payer: Medicare Other | Admitting: Oncology

## 2021-10-30 ENCOUNTER — Encounter: Payer: Self-pay | Admitting: *Deleted

## 2021-10-30 ENCOUNTER — Inpatient Hospital Stay: Payer: Medicare Other | Attending: Family Medicine

## 2021-11-01 ENCOUNTER — Telehealth: Payer: Self-pay

## 2021-11-01 NOTE — Telephone Encounter (Signed)
Pt called inquiring about appointment. Informed Pt she had missed her appointment on 10/30/21. Pt stated she was sorry and mixed up her appointment times. Informed Pt to call tomorrow to reschedule appointment times. Pt verbalized understanding. Message sent to scheduling to call to reschedule appointment.

## 2021-11-02 ENCOUNTER — Ambulatory Visit: Payer: Medicare Other | Admitting: Physician Assistant

## 2021-11-02 ENCOUNTER — Ambulatory Visit (INDEPENDENT_AMBULATORY_CARE_PROVIDER_SITE_OTHER): Payer: Medicare Other | Admitting: Physician Assistant

## 2021-11-02 ENCOUNTER — Encounter: Payer: Self-pay | Admitting: Physician Assistant

## 2021-11-02 ENCOUNTER — Ambulatory Visit (INDEPENDENT_AMBULATORY_CARE_PROVIDER_SITE_OTHER)
Admission: RE | Admit: 2021-11-02 | Discharge: 2021-11-02 | Disposition: A | Discharge status: Home / Self Care | Payer: Medicare Other | Source: Ambulatory Visit | Attending: Physician Assistant | Admitting: Physician Assistant

## 2021-11-02 ENCOUNTER — Telehealth: Payer: Self-pay | Admitting: Oncology

## 2021-11-02 VITALS — BP 180/84 | HR 70 | Ht 61.5 in | Wt 147.5 lb

## 2021-11-02 DIAGNOSIS — R1084 Generalized abdominal pain: Secondary | ICD-10-CM | POA: Diagnosis not present

## 2021-11-02 DIAGNOSIS — K582 Mixed irritable bowel syndrome: Secondary | ICD-10-CM

## 2021-11-02 DIAGNOSIS — D3A098 Benign carcinoid tumors of other sites: Secondary | ICD-10-CM

## 2021-11-02 NOTE — Patient Instructions (Signed)
Your provider has requested that you have an abdominal x ray before leaving today. Please go to the basement floor to our Radiology department for the test.   Use Fiber supplements daily  Drink 6-8 ounce glasses of water daily  Ise probiotics daily  Follow up pre recommendations after test  Due to recent changes in healthcare laws, you may see the results of your imaging and laboratory studies on MyChart before your provider has had a chance to review them.  We understand that in some cases there may be results that are confusing or concerning to you. Not all laboratory results come back in the same time frame and the provider may be waiting for multiple results in order to interpret others.  Please give [place] 48 hours in order for your provider to thoroughly review all the results before contacting the office for clarification of your results.    If you are age 2 or older, your body mass index should be between 23-30. Your Body mass index is 27.42 kg/m. If this is out of the aforementioned range listed, please consider follow up with your Primary Care Provider.  If you are age 66 or younger, your body mass index should be between 19-25. Your Body mass index is 27.42 kg/m. If this is out of the aformentioned range listed, please consider follow up with your Primary Care Provider.   ________________________________________________________  The [place] [facility] providers would like to encourage you to use [facility] to communicate with providers for non-urgent requests or questions.  Due to long hold times on the telephone, sending your provider a message by [facility] may be a faster and more efficient way to get a response.  Please allow 48 business hours for a response.  Please remember that this is for non-urgent requests.  _______________________________________________________   I appreciate the  opportunity to care for you  Thank You   Lovett Calender

## 2021-11-02 NOTE — Telephone Encounter (Signed)
Attempted to contact patient in regards to missed appointments on 6/26, no answer and was unable to leave voicemail. Pending patient to call back

## 2021-11-02 NOTE — Progress Notes (Signed)
I agree with the above note, plan 

## 2021-11-02 NOTE — Progress Notes (Signed)
Chief Complaint: Constipation and diarrhea  Review of pertinent gastrointestinal problems: 1. Chronic diarrhea: Colonoscopy Dr. Gala Romney 02/2013: pan diverticulosis, normal TI, biopsies randomly from colon were normal, a few small HPs were removed. 01/2013 celiac panel, TSH were both normal.  2018 dicyclomine works very well for her diarrhea 2. 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 Gala Romney), clinical stage III (uT2, pN1), status post an endoscopic biopsy 02/04/2013 confirming a duodenal carcinoid tumor, status post a duodenal resection 03/31/2013 Dr. Barry Dienes with no remaining tumor found in the duodenum and a positive portal lymph node;  Repeat upper endoscopy/EUS on 08/20/2013 confirming a persistent duodenal nodule with a biopsy confirming a low-grade neuroendocrine carcinoma. EGD 2016 Dr. Ardis Hughs, same; the previously noted duodenal nodule was still present in the duodenum, this is carcinoid and it was probably never removed during her duodenal resection surgery. EGD Dr. Ardis Hughs 04/2016: same nodule (path showed carcinoid again).  Also benign appearing GE junction stricture was dilated to 20 mm Follows with Dr. Julieanne Manson, last OV 10/05/2020 (missed recent office visit) 3. EUS 10/2014above also noted changes consistent with chronic pancreatitis 4.  Dysphasia, benign GE junction stricture.  Dilated 12 2017 to 20 mm with balloon.  EGD September 2020 focal peptic stricture again noted, dilated to 18 mm.  Small hiatal hernia, moderate gastritis was biopsied.  The previously well-documented duodenal nodule was unchanged.  HPI:    Mrs. Tiffany Ortiz is a 76 year old Caucasian female with a past medical history as listed below including anxiety, fibromyalgia, diabetes, IBS and multiple others, known to Dr. Ardis Hughs, who was referred to me by Glendon Axe, MD for a complaint of constipation and diarrhea.     10/27/2019 patient seen in clinic by Dr. Ardis Hughs.  At  that time had alternating diarrhea and constipation.  Also generalized abdominal pain and nausea.  That time discussed it was difficult to know how much of her symptoms is functional.  There was concerns about the carcinoid being still in situ despite surgery many years ago.  That time awaiting a repeat chromogranin a level off of Dexilant.  At that time thought a lot of her esophageal throat and GERD symptoms related to the fact that she had been off of her Dexilant for Chromogranin tested.    11/12/2019 PET scan with findings most consistent with a small duodenal well-differentiated neuroendocrine tumor and no evidence of additional neuroendocrine tumor.    10/05/2020 chromogranin A levels 778.7 (520.9 on 10/26/2019)    10/06/2021 patient called and described constipation.  That time was using enemas, MiraLAX and laxatives.    Today, the patient tells me that for at least 10 or more years she has radiated back and forth from diarrhea to constipation and manages this with multiple different products including Lomotil or enemas/stool softeners.  Most recently she went through a terrible time with constipation where she really did not have a bowel movement for an entire month.  Over the past few days now she has been having stools but they seem more liquidy.  Along with this gets bloated and has a generalized abdominal discomfort which she can feel on both sides of her abdomen.  Tells me she is not taking anything every day for her bowel habits, though she does have a fiber supplement and probiotic at home.    Denies fever, chills, weight loss or blood in her stool.  Past Medical History:  Diagnosis Date   Allergy  Anxiety    Arthritis    Cancer ([place])    carcinoid tumor stomach   Cataract    REMOVED BILATERAL   Depression    Diabetes mellitus    Fibromyalgia    GERD (gastroesophageal reflux disease)    Headache(784.0)    Hyperlipidemia    Hyperplastic colon polyp 02/04/2013   Hypertension    IBS  (irritable bowel syndrome)    Osteoporosis    Polyp of rectum 02/04/2013   Renal disorder    Shortness of breath    Sleep apnea    Vitamin D deficiency     Past Surgical History:  Procedure Laterality Date   ABDOMINAL HYSTERECTOMY     Cervical cancer, with incidental appendectomy   APPENDECTOMY     BIOPSY N/A 02/04/2013   Procedure: BIOPSY;  Surgeon: Daneil Dolin, MD;  [facility];  Service: Endoscopy;  Laterality: N/A;  SB BX AND RANDOM COLON BX   bladder tack     BREAST LUMPECTOMY     benign   COLONOSCOPY  09/22/2003   RMR: Diminutive rectal polyps cold biopsy/removed.  Otherwise normal rectal mucosa/Pancolonic diverticula.  The remainder of the colonic mucosa appeared normal   COLONOSCOPY WITH ESOPHAGOGASTRODUODENOSCOPY (EGD) N/A 02/04/2013   Procedure: COLONOSCOPY WITH ESOPHAGOGASTRODUODENOSCOPY (EGD);  Surgeon: Daneil Dolin, MD;  Location: [facility];  Service: Endoscopy;  Laterality: N/A;  10:15-moved to 9:30am   ESOPHAGOGASTRODUODENOSCOPY  11/19/2006   [id number] Schatzki's ring with overlying distal esophageal erosions consistent with erosive reflux esophagitis, status post dilation and disruption of ring as described above/ Small hiatal hernia/Otherwise normal stomach, first duodenum and second duodenum   EUS N/A 02/12/2013   Procedure: UPPER ENDOSCOPIC ULTRASOUND (EUS) LINEAR;  Surgeon: Milus Banister, MD;  Location: [facility];  Service: Endoscopy;  Laterality: N/A;   EUS N/A 08/20/2013   Procedure: UPPER ENDOSCOPIC ULTRASOUND (EUS) LINEAR;  Surgeon: Milus Banister, MD;  Location: [facility];  Service: Endoscopy;  Laterality: N/A;   LAPAROSCOPIC SMALL BOWEL RESECTION N/A 03/31/2013   Procedure: LAPAROSCOPIC  ASSISTED CONVERTED TO OPEN RESECTION DUODENAL MASS/UPPER ENDOSCOPY;  Surgeon: Stark Klein, MD;  Location: [facility];  Service: General;  Laterality: N/A;   TONSILLECTOMY      Current Outpatient Medications  Medication Sig Dispense Refill    amLODipine (NORVASC) 5 MG tablet Take 5 mg by mouth daily.     bismuth subsalicylate (PEPTO BISMOL) 262 MG/15ML suspension Take 30 mLs by mouth as needed.     chlorthalidone (HYGROTON) 25 MG tablet chlorthalidone 25 mg tablet     Cholecalciferol (VITAMIN D PO) Take 5,000 Units by mouth daily. Mondays     clonazePAM (KLONOPIN) 0.5 MG tablet Take 0.25 mg by mouth 2 (two) times daily as needed.     Coenzyme Q10 (CO Q-10 PO) Take 1 tablet by mouth daily.     CREON 12000-38000 units CPEP capsule TAKE 2 CAPSULES DAILY WITH EACH MEAL. 180 capsule 0   DEXILANT 60 MG capsule TAKE ONE CAPSULE BY MOUTH ONCE DAILY. 30 capsule 0   diphenoxylate-atropine (LOMOTIL) 2.5-0.025 MG tablet as needed.  (Patient not taking: Reported on 10/05/2020)     fexofenadine (ALLEGRA) 30 MG tablet Take 30 mg by mouth daily.      glipiZIDE (GLUCOTROL XL) 2.5 MG 24 hr tablet Take 2.5 mg by mouth daily.     GLUCOSAMINE-CHONDROIT-VIT C-MN PO Take by mouth daily.     hyoscyamine (LEVBID) 0.375 MG 12 hr tablet Take 0.375 mg by mouth 2 (  two) times daily as needed.      loperamide (IMODIUM A-D) 2 MG tablet Take 2 mg by mouth as needed for diarrhea or loose stools.     mirtazapine (REMERON) 7.5 MG tablet Take 1-2 tablets by mouth at bedtime.     Multiple Vitamin (MULTIVITAMIN) tablet Take 1 tablet by mouth daily. With Vit D     Omega-3 Fatty Acids (OMEGA 3 PO) Take 1 capsule by mouth daily. With Vit D     oxymetazoline (AFRIN) 0.05 % nasal spray Place 1 spray into both nostrils 2 (two) times daily as needed for congestion.     potassium chloride SA (K-DUR) 20 MEQ tablet potassium chloride ER 20 mEq tablet,extended release(part/cryst)     pravastatin (PRAVACHOL) 20 MG tablet Take 20 mg by mouth at bedtime. "most nights" per patient     Probiotic Product (PROBIOTIC PO) Take 1 tablet by mouth daily.     Propylene Glycol-Glycerin (SOOTHE OP) Place 1 drop into both eyes 2 (two) times daily as needed.      RESTASIS 0.05 % ophthalmic emulsion  Place into both eyes 2 (two) times daily.      sertraline (ZOLOFT) 50 MG tablet Take 50 mg by mouth daily.     valACYclovir (VALTREX) 500 MG tablet Take 500 mg by mouth as needed. Reported on 10/31/2015 (Patient not taking: Reported on 10/05/2020)     No current facility-administered medications for this visit.    Allergies as of 11/02/2021 - Review Complete 10/05/2020  Allergen Reaction Noted   Other  01/28/2013   Asa [aspirin]  02/06/2013   Erythromycin  01/28/2013   Famciclovir  02/11/2013   Hydrocodone  02/04/2013   Ibuprofen  02/06/2013   Lactose intolerance (gi) Diarrhea and Other (See Comments) 03/31/2013   Morphine and related Other (See Comments) 09/27/2014   Oxycodone  01/28/2013   Promethazine Nausea And Vomiting 01/30/2011   Tilactase Diarrhea 11/30/2013   Tylenol [acetaminophen]  02/06/2013    Family History  Problem Relation Age of Onset   Cancer Mother        ?ovarian   Cancer Father    Colon cancer Neg Hx    Colon polyps Neg Hx    Esophageal cancer Neg Hx    Rectal cancer Neg Hx    Stomach cancer Neg Hx     Social History   Socioeconomic History   Marital status: Married    Spouse name: Not on file   Number of children: 2   Years of education: Not on file   Highest education level: Not on file  Occupational History   Occupation: retired  Tobacco Use   Smoking status: Former    Types: Cigarettes   Smokeless tobacco: Former    Quit date: 02/11/1970  Vaping Use   Vaping Use: Never used  Substance and Sexual Activity   Alcohol use: No   Drug use: No   Sexual activity: Not on file  Other Topics Concern   Not on file  Social History Narrative   Married, to [employer]   #2 grown children   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, 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   Social Determinants of Radio broadcast assistant Strain: Not on file  Food Insecurity: Not on file  Transportation Needs: Not on file  Physical Activity: Not on file  Stress: Not on  file  Social Connections: Not on file  Intimate Partner Violence: Not on file    Review of Systems:  Constitutional: No weight loss, fever or chills Cardiovascular: No chest pain  Respiratory: No SOB  Gastrointestinal: See HPI and otherwise negative   Physical Exam:  Vital signs: BP (!) 180/84   Pulse 70   Ht 5' 1.5" (1.562 m)   Wt 147 lb 8 oz (66.9 kg)   BMI 27.42 kg/m    Constitutional:   Pleasant AA female appears to be in NAD, Well developed, Well nourished, alert and cooperative Respiratory: Respirations even and unlabored. Lungs clear to auscultation bilaterally.   No wheezes, crackles, or rhonchi.  Cardiovascular: Normal S1, S2. No MRG. Regular rate and rhythm. No peripheral edema, cyanosis or pallor.  Gastrointestinal:  Soft, mild distention, mild generalized TTP. No rebound or guarding. Normal bowel sounds. No appreciable masses or hepatomegaly. Rectal:  Not performed.  Psychiatric: Demonstrates good judgement and reason without abnormal affect or behaviors.  See HPI for most recent labs and imaging.  Assessment: 1.  Alternating bowel habits: For at least 10+ years as alternate back and forth from constipation to diarrhea with some abdominal bloating and generalized abdominal pain, last colonoscopy in 2014 was normal; most likely known IBS 2.  History of neuroendocrine tumor: Follows with oncology and has an upcoming appointment with them  Plan: 1.  Ordered an abdominal x-ray 2 view so we can see what the state of her stool is at the moment, it could be that she has constipation with overflow instead of true diarrhea. 2.  Recommend the patient start a fiber supplement on a daily basis.  We discussed options for this including Metamucil, Citrucel or Benefiber. 3.  Encouraged the patient to increase her water intake to at least 6-8 8 ounce glasses of water per day. 4.  Recommend the patient take a daily probiotic such as Align. 5.  Patient to follow in clinic per  recommendations after x-ray as above.  If symptoms continue in the future could consider repeat colonoscopy as it has been many years since her last one, though the symptoms are not new for her. 6.  Encouraged patient to follow-up with oncology in regards to her neuroendocrine tumor.  Ellouise Newer, PA-C [facility] 11/02/2021, 9:09 AM  Cc: Glendon Axe, MD

## 2021-11-14 ENCOUNTER — Telehealth: Payer: Self-pay

## 2021-11-14 NOTE — Telephone Encounter (Signed)
Patient called in and stated she have an up coming appointment on 12/04/21 with the provider and she want to know if she needs to stop her Prilosec a couple day before her lab test.

## 2021-11-28 ENCOUNTER — Other Ambulatory Visit: Payer: Self-pay

## 2021-11-28 DIAGNOSIS — D3A098 Benign carcinoid tumors of other sites: Secondary | ICD-10-CM

## 2021-12-04 ENCOUNTER — Inpatient Hospital Stay (HOSPITAL_BASED_OUTPATIENT_CLINIC_OR_DEPARTMENT_OTHER): Payer: Medicare Other | Admitting: Oncology

## 2021-12-04 ENCOUNTER — Inpatient Hospital Stay: Payer: Medicare Other | Attending: Oncology

## 2021-12-04 VITALS — BP 150/80 | HR 68 | Temp 98.2°F | Resp 20 | Ht 61.5 in | Wt 147.2 lb

## 2021-12-04 DIAGNOSIS — D3A098 Benign carcinoid tumors of other sites: Secondary | ICD-10-CM

## 2021-12-04 DIAGNOSIS — C7A010 Malignant carcinoid tumor of the duodenum: Secondary | ICD-10-CM | POA: Diagnosis present

## 2021-12-04 NOTE — Progress Notes (Signed)
[facility] OFFICE PROGRESS NOTE   Diagnosis: Duodenal carcinoid tumor  INTERVAL HISTORY:   [name] returns as scheduled.  She has multiple complaints including intermittent abdominal pain, alternating diarrhea and constipation, and nausea.  She reports "hot flashes ".  She says she recently underwent an evaluation for cirrhosis and heart disease at [facility].  She is scheduled to see Dr. Ardis Hughs next month. An abdominal x-ray 11/02/2021 revealed evidence of constipation.  She was seen by gastroenterology 11/02/2021. Objective:  Vital signs in last 24 hours:  Blood pressure (!) 150/80, pulse 68, temperature 98.2 F (36.8 C), temperature source Oral, resp. rate 20, height 5' 1.5" (1.562 m), weight 147 lb 3.2 oz (66.8 kg), SpO2 99 %.    Lymphatics: No cervical, supraclavicular, axillary, or inguinal nodes Resp: Lungs clear bilaterally Cardio: Regular rate and rhythm, no murmur GI: No hepatosplenomegaly, no mass Vascular: No leg edema  Lab Results:  Lab Results  Component Value Date   WBC 9.9 04/05/2013   HGB 10.7 (L) 04/05/2013   HCT 31.9 (L) 04/05/2013   MCV 87.9 04/05/2013   PLT 307 04/05/2013    CMP  Lab Results  Component Value Date   NA 141 10/31/2015   K 3.1 (L) 10/31/2015   CL 109 10/31/2015   CO2 24 10/31/2015   GLUCOSE 119 (H) 10/31/2015   BUN 27 (H) 10/31/2015   CREATININE 1.08 10/31/2015   CALCIUM 10.0 10/31/2015   ALBUMIN 4.0 12/01/2012   AST 17 12/01/2012   ALT 19 12/01/2012   ALKPHOS 107.0 12/01/2012   BILITOT 0.3 12/01/2012   GFRNONAA 71 (L) 04/05/2013   GFRAA 83 (L) 04/05/2013     Medications: I have reviewed the patient's current medications.   Assessment/Plan: 1. 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, clinical stage III (uT2, pN1), status post an endoscopic biopsy 02/04/2013 confirming a duodenal carcinoid tumor, status post a duodenal resection 03/31/2013 with no  remaining tumor found in the duodenum and a positive portal lymph node   Repeat upper endoscopy/EUS on 08/20/2013 confirming a persistent duodenal nodule with a biopsy confirming a low-grade neuroendocrine carcinoma Repeat upper endoscopy 09/27/2014 with a duodenal nodule proximal to a surgical scar, biopsy confirmed a well-differentiated neuroendocrine tumor Upper endoscopy 04/20/2016-benign-appearing esophageal stenosis, dilated.  Previously known duodenal bulb carcinoid unchanged.  Biopsy well-differentiated neuroendocrine tumor. Upper endoscopy 01/21/2019-unchanged duodenal nodule Netspot 11/12/2019- focus of intense activity in the region of the second portion of the duodenum, no corresponding lesion on CT, no evidence of metastatic disease, stable right pulmonary nodules compared to 2017 2. Diarrhea-intermittent- improved 3. Diabetes   4. Sleep apnea   5. Fibromyalgia   6. Hypertension   7. Gastroesophageal reflux disease   8. Status post dilatation of a Schatzki's ring 02/04/2013   9. Vitiligo and hyperpigmented skin rash-evaluated by dermatology    Disposition: Tiffany Ortiz has a history of duodenal carcinoid tumor initially diagnosed in 2014.  Repeat endoscopies have confirmed a residual duodenal nodule and a Netspot 11/12/2019 revealed a focus of activity at the second portion of the duodenum.  Ms. Brothers has multiple GI complaints, but does not have a history suggestive of symptoms related to the carcinoid primary or carcinoid syndrome.  We will follow-up on the chromogranin A level from today.  We decided to proceed with a restaging dotatate scan.  She will return for an office visit in 3 months.  She will see Dr. Ardis Hughs in the interim.  Betsy Coder, MD  12/04/2021  12:47 PM

## 2021-12-05 LAB — CHROMOGRANIN A: Chromogranin A (ng/mL): 399.3 ng/mL — ABNORMAL HIGH (ref 0.0–101.8)

## 2021-12-06 ENCOUNTER — Telehealth: Payer: Self-pay

## 2021-12-06 NOTE — Telephone Encounter (Signed)
Pt verbalized understanding.

## 2021-12-06 NOTE — Telephone Encounter (Signed)
-----   Message from Ladell Pier, MD sent at 12/05/2021  8:21 PM EDT ----- Please call patient, chromogranin A is lower, f/u as scheduled

## 2021-12-11 ENCOUNTER — Other Ambulatory Visit: Payer: Self-pay | Admitting: *Deleted

## 2021-12-11 DIAGNOSIS — D3A098 Benign carcinoid tumors of other sites: Secondary | ICD-10-CM

## 2021-12-12 ENCOUNTER — Telehealth: Payer: Self-pay

## 2021-12-12 NOTE — Telephone Encounter (Signed)
The appt has been changed to 01/04/22 at 130 pm with Colleen.  The pt has been advised of the appt change

## 2021-12-12 NOTE — Telephone Encounter (Signed)
-----   Message from Irving Copas., MD sent at 12/11/2021  5:51 PM EDT ----- BS, Thanks for reaching out to DJ is going to be out for the coming months.  We will have that patient's clinic visit rescheduled with one of our APP's in the interim.  If there is any other significant issues that you need to discuss about her that could require advanced endoscopic procedures then feel free to reach back out to me. GM  Jatziri Goffredo, Please change this patient's clinic visit which was scheduled with DJ to one of the APP's around the same time.  Thanks. GM ----- Message ----- From: Ladell Pier, MD Sent: 12/04/2021   2:12 PM EDT To: Milus Banister, MD  I saw her today for yearly follow-up visit.  She has multiple complaints, chiefly GI symptoms.  Chromogranin a level is pending.  I have a low suspicion for progression of the carcinoid tumor, but we decided to schedule a dotatate scan for within the next few weeks, she is scheduled to see you next month  Thanks,  Brad

## 2021-12-22 ENCOUNTER — Encounter (HOSPITAL_COMMUNITY): Admission: RE | Admit: 2021-12-22 | Payer: Medicare Other | Source: Ambulatory Visit

## 2022-01-02 ENCOUNTER — Ambulatory Visit: Payer: Medicare Other | Admitting: Gastroenterology

## 2022-01-04 ENCOUNTER — Encounter: Payer: Self-pay | Admitting: Nurse Practitioner

## 2022-01-04 ENCOUNTER — Ambulatory Visit (INDEPENDENT_AMBULATORY_CARE_PROVIDER_SITE_OTHER): Payer: Medicare Other | Admitting: Nurse Practitioner

## 2022-01-04 VITALS — BP 148/96 | HR 80 | Ht 61.0 in | Wt 149.1 lb

## 2022-01-04 DIAGNOSIS — K5900 Constipation, unspecified: Secondary | ICD-10-CM

## 2022-01-04 DIAGNOSIS — R1310 Dysphagia, unspecified: Secondary | ICD-10-CM | POA: Diagnosis not present

## 2022-01-04 MED ORDER — LINACLOTIDE 72 MCG PO CAPS: 72.0000 ug | ORAL_CAPSULE | Freq: Every day | ORAL | 1 refills | Status: DC

## 2022-01-04 NOTE — Patient Instructions (Signed)
_______________________________________________________  If you are age 76 or older, your body mass index should be between 23-30. Your Body mass index is 28.18 kg/m. If this is out of the aforementioned range listed, please consider follow up with your Primary Care Provider.  If you are age 75 or younger, your body mass index should be between 19-25. Your Body mass index is 28.18 kg/m. If this is out of the aformentioned range listed, please consider follow up with your Primary Care Provider.   ________________________________________________________  The [place] [facility] providers would like to encourage you to use [facility] to communicate with providers for non-urgent requests or questions.  Due to long hold times on the telephone, sending your provider a message by [facility] may be a faster and more efficient way to get a response.  Please allow 48 business hours for a response.  Please remember that this is for non-urgent requests.  _______________________________________________________  We have sent the following medications to your pharmacy for you to pick up at your convenience: Linzess 9mg capsule. Take 1 capsule 30 minutes before breakfast.  Please call with any questions or concerns.  It was a pleasure to see you today!  Thank you for trusting me with your gastrointestinal care!

## 2022-01-04 NOTE — Progress Notes (Addendum)
01/04/2022 DOLOREZ JEFFREY [id number] [birth year]   Chief Complaint: Constipation   History of Present Illness: Jessah L. Shimamoto is a 76 year old female with a past medical history of hypertension, fibromyalgia, diabetes mellitus type 2, chronic pancreatitis, rectal and colon polyps, GERD and duodenal carcinoid tumor. She is known by Dr. Ardis Hughs.   She presents today for further evaluation regarding constipation. She is a challenging historian. She reported going "one month without passing a BM" in June or July. She described feeling stool pass into the rectum with the urge to defecate but couldn't pass the stool which felt like the "stool moved backward higher into the colon ".  She took Miralax once daily x 2 weeks without improvement. She drank prune juice and ate prunes without passing a BM. She used a fleet enema x 3 over the past 3 to 4 weeks which resulted in passing a mushy Doerner stool. No rectal bleeding or black stools. She has abdominal bloat which comes and goes. No abdominal pain. No diarrhea for a few months.   She was seen by Dr. Ammie Dalton for duodenal carcinoid follow up on 12/04/2021. Chomagranin A level 399.3 on 12/04/2021. Per Dr. Benay Spice, the patient reported undergoing an evaluation for cirrhosis and heart disease at [facility]. She is not sure what evaluation she had at [facility] but one of the tests checked her heart. I will request a copy of her [facility] records. She has some chest pains a while ago, no chest pain at this time. No SOB.   She has intermittent difficulty swallowing but food and liquids do not get stuck but described feeling choked at time. Her swallowing issues are similar to the symptoms she had prior to her most recent EGD 01/21/2019 which showed a focal peptic stricture which was dilated to 18 mm, small hiatal hernia, moderate gastritis and the previously well-documented duodenal nodule was unchanged. She remains on Omeprazole '40mg'$  QD.   Review  of pertinent gastrointestinal problems: 1. Chronic diarrhea: Colonoscopy Dr. Gala Romney 02/2013: pan diverticulosis, normal TI, biopsies randomly from colon were normal, a few small HPs were removed. 01/2013 celiac panel, TSH were both normal.  2018 dicyclomine works very well for her diarrhea 2. 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 Gala Romney), clinical stage III (uT2, pN1), status post an endoscopic biopsy 02/04/2013 confirming a duodenal carcinoid tumor, status post a duodenal resection 03/31/2013 Dr. Barry Dienes with no remaining tumor found in the duodenum and a positive portal lymph node;  Repeat upper endoscopy/EUS on 08/20/2013 confirming a persistent duodenal nodule with a biopsy confirming a low-grade neuroendocrine carcinoma. EGD 2016 Dr. Ardis Hughs, same; the previously noted duodenal nodule was still present in the duodenum, this is carcinoid and it was probably never removed during her duodenal resection surgery. EGD Dr. Ardis Hughs 04/2016: same nodule (path showed carcinoid again).  Also benign appearing GE junction stricture was dilated to 20 mm 3. EUS 10/2014above also noted changes consistent with chronic pancreatitis 4.  Dysphasia, benign GE junction stricture.  Dilated 12 2017 to 20 mm with balloon.  EGD September 2020 focal peptic stricture again noted, dilated to 18 mm.  Small hiatal hernia, moderate gastritis was biopsied, negative for H. Pylori. The previously well-documented duodenal nodule was unchanged 5.Colonoscopy 02/05/2023 showed a 45m hyperplastic polyp at 12cm from the anal verge, 1 diminutive hyperplastic polyp removed from the ascending colon, pan diverticulosis and anal canal hemorrhoids.   Most recent PET scan  11/12/2019:1. Findings most consistent with a small duodenal well differentiated neuroendocrine tumor. 2. No evidence of additional neuroendocrine tumor on skull base to thigh DOTATATE PET scan. 3. Stable small RIGHT  pulmonary nodules compared to CT 2017.   Current Outpatient Medications on File Prior to Visit  Medication Sig Dispense Refill   amLODipine (NORVASC) 5 MG tablet Take 5 mg by mouth daily.     bismuth subsalicylate (PEPTO BISMOL) 262 MG/15ML suspension Take 30 mLs by mouth as needed.     chlorthalidone (HYGROTON) 25 MG tablet chlorthalidone 25 mg tablet     clonazePAM (KLONOPIN) 0.5 MG tablet Take 0.25 mg by mouth 2 (two) times daily as needed.     Coenzyme Q10 (CO Q-10 PO) Take 1 tablet by mouth daily.     CREON 12000-38000 units CPEP capsule TAKE 2 CAPSULES DAILY WITH EACH MEAL. 180 capsule 0   diphenoxylate-atropine (LOMOTIL) 2.5-0.025 MG tablet as needed.     fexofenadine (ALLEGRA) 30 MG tablet Take 30 mg by mouth daily.      glipiZIDE (GLUCOTROL XL) 2.5 MG 24 hr tablet Take 2.5 mg by mouth daily.     GLUCOSAMINE-CHONDROIT-VIT C-MN PO Take 1 tablet by mouth daily.     hyoscyamine (LEVBID) 0.375 MG 12 hr tablet Take 0.375 mg by mouth 2 (two) times daily as needed.      loperamide (IMODIUM A-D) 2 MG tablet Take 2 mg by mouth as needed for diarrhea or loose stools. (Patient not taking: Reported on 12/04/2021)     Multiple Vitamin (MULTIVITAMIN) tablet Take 1 tablet by mouth daily. With Vit D     Omega-3 Fatty Acids (OMEGA 3 PO) Take 1 capsule by mouth daily. With Vit D     omeprazole (PRILOSEC) 40 MG capsule Take 40 mg by mouth daily.     oxymetazoline (AFRIN) 0.05 % nasal spray Place 1 spray into both nostrils 2 (two) times daily as needed for congestion.     potassium chloride SA (K-DUR) 20 MEQ tablet potassium chloride ER 20 mEq tablet,extended release(part/cryst)     pravastatin (PRAVACHOL) 20 MG tablet Take 20 mg by mouth at bedtime. "most nights" per patient     Probiotic Product (PROBIOTIC PO) Take 1 tablet by mouth daily.     Propylene Glycol-Glycerin (SOOTHE OP) Place 1 drop into both eyes 2 (two) times daily as needed.      RESTASIS 0.05 % ophthalmic emulsion Place into both eyes 2  (two) times daily.      sertraline (ZOLOFT) 50 MG tablet Take 50 mg by mouth daily.     valACYclovir (VALTREX) 500 MG tablet Take 500 mg by mouth as needed. Reported on 10/31/2015     No current facility-administered medications on file prior to visit.   Allergies  Allergen Reactions   Other     Metal alloy-breaks her out-esp. Metal surgical instruments or some metal implanted in me   Diona Fanti [Aspirin]     Kidney doctor told her to not take ASA   Erythromycin     Doesn't remember    Famciclovir     migrane   Hydrocodone     "makes me lose my mind"-hallucinations   Ibuprofen     Kidney doctor told her to not take ibuprofen.   Lactose Intolerance (Gi) Diarrhea and Other (See Comments)    Severe diarrhea   Morphine And Related Other (See Comments)    Pt says Morphine makes her go out of her head   Oxycodone  hallucinations   Promethazine Nausea And Vomiting   Tilactase Diarrhea   Tylenol [Acetaminophen]     Knocks her out.   Current Medications, Allergies, Past Medical History, Past Surgical History, Family History and Social History were reviewed in [employer] record.  Review of Systems:   Constitutional: Negative for fever, sweats, chills or weight loss.  Respiratory: Negative for shortness of breath.   Cardiovascular: Negative for chest pain, palpitations and leg swelling.  Gastrointestinal: See HPI.  Musculoskeletal: Negative for back pain or muscle aches.  Neurological: Negative for dizziness, headaches or paresthesias.    Physical Exam: BP (!) 148/96 (BP Location: Left Arm, Patient Position: Sitting, Cuff Size: Normal)   Pulse 80   Ht '5\' 1"'$  (1.549 m) Comment: height measured without shoes  Wt 149 lb 2 oz (67.6 kg)   BMI 28.18 kg/m   General: 76 year old female in no acute distress. Head: Normocephalic and atraumatic. Eyes: No scleral icterus. Conjunctiva pink . Ears: Normal auditory acuity. Mouth: Dentition intact. No ulcers or lesions.   Lungs: Clear throughout to auscultation. Heart: Regular rate and rhythm, no murmur. Abdomen: Soft, nondistended.  Mild generalized abdominal tenderness without rebound or guarding.  No masses or hepatomegaly. Normal bowel sounds x 4 quadrants.  Rectal: Deferred. Musculoskeletal: Symmetrical with no gross deformities. Extremities: No edema. Neurological: Alert oriented x 4. No focal deficits.  Psychological: Alert and cooperative. Normal mood and affect  Assessment and Recommendations:  22) 76 year old female with constipation, bloat and generalized abdominal pain -Linzess 51mg one tab to be taken 30 minutes before breakfast  -Miralax Q HS as needed -Fiber diet, drink 6 glasses of water daily  2) History of a benign esophageal stricture with chronic dysphagia. Her esophagus was dilated at the time of her most recent EGD in 2020.  -Repeat EGD with esophageal dilatation discussed, patient does not wish to pursue at this time. 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 Chromagranin A level 778.7 one year ago  -> 399.3 on 12/04/2021 -Continue follow up with Dr. SBenay Spice  4) Hyperplastic rectal and colon polyp per colonoscopy 02/2013 -Colonoscopy due 02/2023, she does not wish to pursue any endoscopic evaluation at this time  5) [facility] per Dr. SGearldine Shownlast office visit, patient possibly underwent recent evaluation for possible cardiac disease and cirrhosis.   ADDENDUM: Records received. [facility]%.  Mild asymmetric septal hypertrophy with septal thickness 13 to 15 mm.  Left atrium is mildly dilated.  RV is normal.  No aortic stenosis.  Trace mitral regurgitation.  ADDENDUM: Abdominal sonogram 12/21/2021 showed a coarse hepatic echotexture,  nonspecific, cirrhosis or fatty infiltration could have this appearance. Hepatopetal flow present in the portal vein.Liver size is 13.1cm. Normal gallbladder. CBD measures 0.20cm. No intrahepatic bilary dilatation.

## 2022-01-07 DIAGNOSIS — K5909 Other constipation: Secondary | ICD-10-CM | POA: Insufficient documentation

## 2022-01-07 DIAGNOSIS — K5900 Constipation, unspecified: Secondary | ICD-10-CM | POA: Insufficient documentation

## 2022-01-09 NOTE — Progress Notes (Signed)
____________________________________________________________  Attending physician addendum:  Thank you for sending this case to me. I have reviewed the entire note and agree with the plan.  I agree it is difficult to characterize the symptoms.  Looking at her medication list, I would also try to avoid the use of antispasmodic/ antidiarrheal medicines chance for side effects in elderly patient.  She may be treating benign intermittent diarrhea with etc.  Wilfrid Lund, MD  ____________________________________________________________

## 2022-03-06 ENCOUNTER — Inpatient Hospital Stay: Payer: Medicare Other | Admitting: Oncology

## 2022-03-06 ENCOUNTER — Inpatient Hospital Stay: Payer: Medicare Other | Attending: Family Medicine

## 2022-03-06 ENCOUNTER — Encounter: Payer: Self-pay | Admitting: *Deleted

## 2022-03-06 NOTE — Progress Notes (Signed)
"  No show" for lab/OV today. Scheduling message sent to reschedule for ~ 1 month.

## 2022-03-13 ENCOUNTER — Telehealth: Payer: Self-pay | Admitting: Oncology

## 2022-03-13 NOTE — Telephone Encounter (Signed)
Attempted to contact patient to reschedule no show appointment but no answer and voicemail was not set up so unable to leave message

## 2022-07-19 ENCOUNTER — Ambulatory Visit (INDEPENDENT_AMBULATORY_CARE_PROVIDER_SITE_OTHER): Payer: PPO | Admitting: Nurse Practitioner

## 2022-07-19 ENCOUNTER — Encounter: Payer: Self-pay | Admitting: Nurse Practitioner

## 2022-07-19 ENCOUNTER — Other Ambulatory Visit (INDEPENDENT_AMBULATORY_CARE_PROVIDER_SITE_OTHER): Payer: PPO

## 2022-07-19 VITALS — BP 160/90 | HR 88 | Ht 61.0 in | Wt 151.2 lb

## 2022-07-19 DIAGNOSIS — K8689 Other specified diseases of pancreas: Secondary | ICD-10-CM

## 2022-07-19 DIAGNOSIS — K5900 Constipation, unspecified: Secondary | ICD-10-CM

## 2022-07-19 LAB — CBC WITH DIFFERENTIAL/PLATELET
Basophils Absolute: 0.1 10*3/uL (ref 0.0–0.1)
Basophils Relative: 1.5 % (ref 0.0–3.0)
Eosinophils Absolute: 0.2 10*3/uL (ref 0.0–0.7)
Eosinophils Relative: 2.5 % (ref 0.0–5.0)
HCT: 42.3 % (ref 36.0–46.0)
Hemoglobin: 14.5 g/dL (ref 12.0–15.0)
Lymphocytes Relative: 36 % (ref 12.0–46.0)
Lymphs Abs: 3 10*3/uL (ref 0.7–4.0)
MCHC: 34.2 g/dL (ref 30.0–36.0)
MCV: 87.9 fl (ref 78.0–100.0)
Monocytes Absolute: 0.7 10*3/uL (ref 0.1–1.0)
Monocytes Relative: 8.9 % (ref 3.0–12.0)
Neutro Abs: 4.2 10*3/uL (ref 1.4–7.7)
Neutrophils Relative %: 51.1 % (ref 43.0–77.0)
Platelets: 328 10*3/uL (ref 150.0–400.0)
RBC: 4.81 Mil/uL (ref 3.87–5.11)
RDW: 13.6 % (ref 11.5–15.5)
WBC: 8.2 10*3/uL (ref 4.0–10.5)

## 2022-07-19 LAB — COMPREHENSIVE METABOLIC PANEL
ALT: 39 U/L — ABNORMAL HIGH (ref 0–35)
AST: 38 U/L — ABNORMAL HIGH (ref 0–37)
Albumin: 3.8 g/dL (ref 3.5–5.2)
Alkaline Phosphatase: 80 U/L (ref 39–117)
BUN: 23 mg/dL (ref 6–23)
CO2: 29 mEq/L (ref 19–32)
Calcium: 9.8 mg/dL (ref 8.4–10.5)
Chloride: 103 mEq/L (ref 96–112)
Creatinine, Ser: 0.79 mg/dL (ref 0.40–1.20)
GFR: 72.57 mL/min (ref >60.00)
Glucose, Bld: 123 mg/dL — ABNORMAL HIGH (ref 70–99)
Potassium: 3.1 mEq/L — ABNORMAL LOW (ref 3.5–5.1)
Sodium: 142 mEq/L (ref 135–145)
Total Bilirubin: 0.4 mg/dL (ref 0.2–1.2)
Total Protein: 7.2 g/dL (ref 6.0–8.3)

## 2022-07-19 NOTE — Patient Instructions (Addendum)
We have sent the following medications to your pharmacy for you to pick up at your convenience: Linzess 72 mg to be taken 30 minutes before breakfast  Benefiber- 1 tablespoon daily  Your provider has requested that you go to the basement level for lab work before leaving today. Press "B" on the elevator. The lab is located at the first door on the left as you exit the elevator.  Due to recent changes in healthcare laws, you may see the results of your imaging and laboratory studies on MyChart before your provider has had a chance to review them.  We understand that in some cases there may be results that are confusing or concerning to you. Not all laboratory results come back in the same time frame and the provider may be waiting for multiple results in order to interpret others.  Please give [place] 48 hours in order for your provider to thoroughly review all the results before contacting the office for clarification of your results.   Thank you for trusting me with your gastrointestinal care!   Carl Best, CRNP

## 2022-07-19 NOTE — Progress Notes (Addendum)
07/19/2022 Tiffany Ortiz [id number] 1946/03/11   Chief Complaint: Constipation   History of Present Illness: Tiffany Ortiz is a 77 year old female with a past medical history of anxiety, depression, arthritis, fibromyalgia, hypertension, duodenal carcinoid, GERD and colon polyps.   She presents today for further evaluation regarding constipation. She is accompanied by her daughter. She takes Linzess once daily three days weekly which results in passing a soft or loose stool daily. However, she doesn't always feel emptied and she is concerned that her "stomach is large". She drinks warm water with lime juice and baking soda then takes Linzess which she assess facilitates her bowel movements. No bloody or black stools. She remains on Creon as previously prescribed. Patient and daughter questions if she needs to continue taking Creon. She takes Hyoscyamine "for constipation" and not due to having abdominal pain. She rarely has brief left sided abdominal pan. No current issues with dysphagia. She remains on Omeprazole 40mg  QD.   Refer to office visit 01/04/2022, at that time she reported undergoing an ECHO and abdominal sonogram. Records were received as follows:  ECHO 11/24/2021 from [facility]: LVEF 55 to 60%.  Mild asymmetric septal hypertrophy with septal thickness 13 to 15 mm.  Left atrium is mildly dilated.  RV is normal.  No aortic stenosis.  Trace mitral regurgitation.   Abdominal sonogram 12/21/2021 showed a coarse hepatic echotexture, nonspecific, cirrhosis or fatty infiltration could have this appearance. Hepatopetal flow present in the portal vein. Liver size is 13.1cm. Normal gallbladder. CBD measures 0.20cm. No intrahepatic bilary dilatation  Review of pertinent gastrointestinal problems: 1. Chronic diarrhea: Colonoscopy Dr. Jena Gauss 02/2013: pan diverticulosis, normal TI, biopsies randomly from colon were normal, a few small HPs were removed. 01/2013 celiac  panel, TSH were both normal.  2018 dicyclomine works very well for her diarrhea 2. 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 Jena Gauss), clinical stage III (uT2, pN1), status post an endoscopic biopsy 02/04/2013 confirming a duodenal carcinoid tumor, status post a duodenal resection 03/31/2013 Dr. Donell Beers with no remaining tumor found in the duodenum and a positive portal lymph node;  Repeat upper endoscopy/EUS on 08/20/2013 confirming a persistent duodenal nodule with a biopsy confirming a low-grade neuroendocrine carcinoma. EGD 2016 Dr. Christella Hartigan, same; the previously noted duodenal nodule was still present in the duodenum, this is carcinoid and it was probably never removed during her duodenal resection surgery. EGD Dr. Christella Hartigan 04/2016: same nodule (path showed carcinoid again).  Also benign appearing GE junction stricture was dilated to 20 mm 3. EUS 10/2014above also noted changes consistent with chronic pancreatitis 4.  Dysphasia, benign GE junction stricture.  Dilated 12 2017 to 20 mm with balloon.  EGD September 2020 focal peptic stricture again noted, dilated to 18 mm.  Small hiatal hernia, moderate gastritis was biopsied, negative for H. Pylori. The previously well-documented duodenal nodule was unchanged 5.Colonoscopy 02/04/2013: showed a 4mm hyperplastic polyp at 12cm from the anal verge, 1 diminutive hyperplastic polyp removed from the ascending colon, pan diverticulosis and anal canal hemorrhoids.    Past Medical History:  Diagnosis Date   Allergy    Anxiety    Arthritis    Cancer (HCC)    carcinoid tumor stomach   Cataract    REMOVED BILATERAL   Depression    Diabetes mellitus    Fibromyalgia    GERD (gastroesophageal reflux disease)    Headache(784.0)    Hyperlipidemia  Hyperplastic colon polyp 02/04/2013   Hypertension    IBS (irritable bowel syndrome)    Osteoporosis    Polyp of rectum 02/04/2013   Renal disorder     Shortness of breath    Sleep apnea    Vitamin D deficiency    Past Surgical History:  Procedure Laterality Date   ABDOMINAL HYSTERECTOMY     Cervical cancer, with incidental appendectomy   APPENDECTOMY     BIOPSY N/A 02/04/2013   Procedure: BIOPSY;  Surgeon: Corbin Ade, MD;  Location: [facility];  Service: Endoscopy;  Laterality: N/A;  SB BX AND RANDOM COLON BX   bladder tack     BREAST LUMPECTOMY     benign   COLONOSCOPY  09/22/2003   RMR: Diminutive rectal polyps cold biopsy/removed.  Otherwise normal rectal mucosa/Pancolonic diverticula.  The remainder of the colonic mucosa appeared normal   COLONOSCOPY WITH ESOPHAGOGASTRODUODENOSCOPY (EGD) N/A 02/04/2013   Procedure: COLONOSCOPY WITH ESOPHAGOGASTRODUODENOSCOPY (EGD);  Surgeon: Corbin Ade, MD;  Location: [facility];  Service: Endoscopy;  Laterality: N/A;  10:15-moved to 9:30am   ESOPHAGOGASTRODUODENOSCOPY  11/19/2006   WUJ:[id number] Schatzki's ring with overlying distal esophageal erosions consistent with erosive reflux esophagitis, status post dilation and disruption of ring as described above/ Small hiatal hernia/Otherwise normal stomach, first duodenum and second duodenum   EUS N/A 02/12/2013   Procedure: UPPER ENDOSCOPIC ULTRASOUND (EUS) LINEAR;  Surgeon: Rachael Fee, MD;  Location: [facility];  Service: Endoscopy;  Laterality: N/A;   EUS N/A 08/20/2013   Procedure: UPPER ENDOSCOPIC ULTRASOUND (EUS) LINEAR;  Surgeon: Rachael Fee, MD;  Location: [facility];  Service: Endoscopy;  Laterality: N/A;   LAPAROSCOPIC SMALL BOWEL RESECTION N/A 03/31/2013   Procedure: LAPAROSCOPIC  ASSISTED CONVERTED TO OPEN RESECTION DUODENAL MASS/UPPER ENDOSCOPY;  Surgeon: Almond Lint, MD;  Location: [facility];  Service: General;  Laterality: N/A;   TONSILLECTOMY     Current Outpatient Medications on File Prior to Visit  Medication Sig Dispense Refill   amLODipine (NORVASC) 5 MG tablet Take 5 mg by mouth daily.     b complex vitamins  capsule Take 1 capsule by mouth daily.     bismuth subsalicylate (PEPTO BISMOL) 262 MG/15ML suspension Take 30 mLs by mouth as needed.     chlorthalidone (HYGROTON) 25 MG tablet chlorthalidone 25 mg tablet     Cholecalciferol 125 MCG (5000 UT) TABS Take 1 capsule by mouth daily.     clonazePAM (KLONOPIN) 0.5 MG tablet Take 0.25 mg by mouth 2 (two) times daily as needed.     Coenzyme Q10 (CO Q-10 PO) Take 1 tablet by mouth daily.     CREON 12000-38000 units CPEP capsule TAKE 2 CAPSULES DAILY WITH EACH MEAL. 180 capsule 0   diphenoxylate-atropine (LOMOTIL) 2.5-0.025 MG tablet as needed.     fexofenadine (ALLEGRA) 30 MG tablet Take 30 mg by mouth daily.      glipiZIDE (GLUCOTROL XL) 5 MG 24 hr tablet Take 5 mg by mouth daily.     GLUCOSAMINE-CHONDROIT-VIT C-MN PO Take 1 tablet by mouth daily.     hyoscyamine (LEVBID) 0.375 MG 12 hr tablet Take 0.375 mg by mouth 2 (two) times daily as needed.      linaclotide (LINZESS) 72 MCG capsule Take 1 capsule (72 mcg total) by mouth daily before breakfast. Take 30 minutes before breakfast 30 capsule 1   Multiple Vitamin (MULTIVITAMIN) tablet Take 1 tablet by mouth daily. With Vit D     Omega-3 Fatty Acids (OMEGA  3 PO) Take 1 capsule by mouth daily. With Vit D     omeprazole (PRILOSEC) 40 MG capsule Take 40 mg by mouth daily.     oxymetazoline (AFRIN) 0.05 % nasal spray Place 1 spray into both nostrils 2 (two) times daily as needed for congestion.     potassium chloride SA (K-DUR) 20 MEQ tablet potassium chloride ER 20 mEq tablet,extended release(part/cryst)     pravastatin (PRAVACHOL) 20 MG tablet Take 20 mg by mouth at bedtime. "most nights" per patient     Probiotic Product (PROBIOTIC PO) Take 1 tablet by mouth daily.     Propylene Glycol-Glycerin (SOOTHE OP) Place 1 drop into both eyes 2 (two) times daily as needed.      RESTASIS 0.05 % ophthalmic emulsion Place into both eyes 2 (two) times daily.      sertraline (ZOLOFT) 50 MG tablet Take 50 mg by mouth  daily.     valACYclovir (VALTREX) 500 MG tablet Take 500 mg by mouth as needed. Reported on 10/31/2015     No current facility-administered medications on file prior to visit.   Allergies  Allergen Reactions   Other     Metal alloy-breaks her out-esp. Metal surgical instruments or some metal implanted in me   [name] [Aspirin]     Kidney doctor told her to not take ASA   Erythromycin     Doesn't remember    Famciclovir     migrane   Hydrocodone     "makes me lose my mind"-hallucinations   Ibuprofen     Kidney doctor told her to not take ibuprofen.   Lactose Intolerance (Gi) Diarrhea and Other (See Comments)    Severe diarrhea   Morphine And Related Other (See Comments)    Pt says Morphine makes her go out of her head   Oxycodone     hallucinations   Promethazine Nausea And Vomiting   Tilactase Diarrhea   Tylenol [Acetaminophen]     Knocks her out.   Current Medications, Allergies, Past Medical History, Past Surgical History, Family History and Social History were reviewed in [employer] record.  Review of Systems:   Constitutional: Negative for fever, sweats, chills or weight loss.  Respiratory: Negative for shortness of breath.   Cardiovascular: Negative for chest pain, palpitations and leg swelling.  Gastrointestinal: See HPI.  Musculoskeletal: Negative for back pain or muscle aches.  Neurological: Negative for dizziness, headaches or paresthesias.   Physical Exam: Ht 5\' 1"  (1.549 m)   Wt 151 lb 4 oz (68.6 kg)   BMI 28.58 kg/m   Wt Readings from Last 3 Encounters:  07/19/22 151 lb 4 oz (68.6 kg)  01/04/22 149 lb 2 oz (67.6 kg)  12/04/21 147 lb 3.2 oz (66.8 kg)    General: 77 year old female in no acute distress. Head: Normocephalic and atraumatic. Eyes: No scleral icterus. Conjunctiva pink . Ears: Normal auditory acuity. Mouth: No ulcers or lesions.  Lungs: Clear throughout to auscultation. Heart: Regular rate and rhythm, no  murmur. Abdomen: Soft, nontender and nondistended. No masses or hepatomegaly. Normal bowel sounds x 4 quadrants.  Rectal: Deferred.  Musculoskeletal: Symmetrical with no gross deformities. Extremities: No edema. Neurological: Alert oriented x 4. No focal deficits.  Psychological: Alert and cooperative. Normal mood and affect  Assessment and Recommendations:  77 year old female with constipation, infrequent left sided abdominal pain  -Linzess one tab to be taken 30 minutes before breakfast, patient taking 3 days weekly, instructed to  take daily if tolerated -Miralax Q HS as needed -Benefiber 1 tbsp QD -Stop Hyoscyamine  -CTAP if significant abdominal pain occurs    History of a benign esophageal stricture with chronic dysphagia. Her esophagus was dilated per EGD in 2020. 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  -Continue follow up with Dr. Truett Perna    Hyperplastic rectal and colon polyp per colonoscopy 02/2013 -Colonoscopy due 02/2023, she does not wish to pursue any further colonoscopies.   Abdominal sonogram 12/21/2021 showed a coarse hepatic echotexture, nonspecific indicating fatty infiltration vs cirrhosis. Less likely cirrhosis with normal platelet count. -CMP with albumin level, CBC  History of pancreatic insufficiency on Creon -Pancreatic elastase level   Chronic normocytic anemia. No overt GI bleeding.  -CBC  Further recommendations to be determined after the above lab results reviewed.

## 2022-07-23 ENCOUNTER — Other Ambulatory Visit: Payer: Self-pay | Admitting: Nurse Practitioner

## 2022-07-23 NOTE — Telephone Encounter (Signed)
Please advise 

## 2022-07-24 NOTE — Progress Notes (Signed)
____________________________________________________________  Attending physician addendum:  Thank you for sending this case to me. I have reviewed the entire note and agree with the plan.  It remains difficult to characterize this patient's predominant bowel pattern.  This seems to been an issue since at least 2019 looking back at Dr. Audelia Acton notes.  I suspect she does have true EPI given the previous radiographic appearance of her pancreas in 2019 fecal elastase mildly decreased at 190.  I agree with your plan to try simplifying her regimen since she seems to take some medicines for constipation and others for diarrhea. If still with difficulty after these most recent changes, I recommend an empiric course of antibiotics for possible SIBO.  Given the usual prohibitive cost of rifaximin, would favor metronidazole 250 mg 3 times daily for 14 days.  Wilfrid Lund, MD  ____________________________________________________________

## 2022-07-25 ENCOUNTER — Other Ambulatory Visit: Payer: Self-pay

## 2022-07-25 DIAGNOSIS — R748 Abnormal levels of other serum enzymes: Secondary | ICD-10-CM

## 2022-07-25 DIAGNOSIS — E876 Hypokalemia: Secondary | ICD-10-CM

## 2022-07-25 MED ORDER — POTASSIUM CHLORIDE CRYS ER 20 MEQ PO TBCR: 20.0000 meq | EXTENDED_RELEASE_TABLET | Freq: Two times a day (BID) | ORAL | 0 refills | Status: AC

## 2022-08-07 ENCOUNTER — Other Ambulatory Visit: Payer: Medicare Other

## 2022-08-07 ENCOUNTER — Other Ambulatory Visit: Payer: Self-pay

## 2022-08-07 DIAGNOSIS — K5900 Constipation, unspecified: Secondary | ICD-10-CM

## 2022-08-07 DIAGNOSIS — K8689 Other specified diseases of pancreas: Secondary | ICD-10-CM

## 2022-08-13 LAB — PANCREATIC ELASTASE, FECAL: Pancreatic Elastase-1, Stool: 312 mcg/g

## 2022-08-22 ENCOUNTER — Telehealth: Payer: Self-pay | Admitting: Nurse Practitioner

## 2022-08-22 NOTE — Telephone Encounter (Signed)
Patient called stating she had brought a stool sample to be tested but has not heard anything since then. She is requesting a call back to discuss those results and future steps. Please advise.  Thank you.

## 2022-08-22 NOTE — Telephone Encounter (Signed)
Attempted to reach pt and no answer voice mail not set up

## 2022-08-23 NOTE — Telephone Encounter (Signed)
Colleen have you seen stool results? The pt is calling to discuss.

## 2022-08-23 NOTE — Telephone Encounter (Signed)
I spoke to the patient and I also called her daughter Angela Nevin at [phone]. I explained the fecal pancreatic elastase level was normal.  Patient was instructed to continue the same dose of Creon.  She can take Linzess as needed as previously prescribed for constipation.

## 2022-10-31 ENCOUNTER — Ambulatory Visit (INDEPENDENT_AMBULATORY_CARE_PROVIDER_SITE_OTHER): Payer: PPO | Admitting: Internal Medicine

## 2022-10-31 ENCOUNTER — Encounter: Payer: Self-pay | Admitting: Internal Medicine

## 2022-10-31 VITALS — BP 150/94 | HR 63 | Ht 61.5 in | Wt 151.0 lb

## 2022-10-31 DIAGNOSIS — K5900 Constipation, unspecified: Secondary | ICD-10-CM | POA: Diagnosis not present

## 2022-10-31 DIAGNOSIS — E119 Type 2 diabetes mellitus without complications: Secondary | ICD-10-CM

## 2022-10-31 DIAGNOSIS — K219 Gastro-esophageal reflux disease without esophagitis: Secondary | ICD-10-CM

## 2022-10-31 DIAGNOSIS — G4733 Obstructive sleep apnea (adult) (pediatric): Secondary | ICD-10-CM

## 2022-10-31 DIAGNOSIS — D3A098 Benign carcinoid tumors of other sites: Secondary | ICD-10-CM

## 2022-10-31 DIAGNOSIS — F419 Anxiety disorder, unspecified: Secondary | ICD-10-CM | POA: Diagnosis not present

## 2022-10-31 DIAGNOSIS — M170 Bilateral primary osteoarthritis of knee: Secondary | ICD-10-CM

## 2022-10-31 DIAGNOSIS — I10 Essential (primary) hypertension: Secondary | ICD-10-CM

## 2022-10-31 NOTE — Progress Notes (Signed)
New Patient Office Visit  Subjective    Patient ID: Tiffany Ortiz, female    DOB: 05-17-1945  Age: 77 y.o. MRN: [id number]  CC:  Chief Complaint  Patient presents with   Establish Care    HPI Tiffany Ortiz presents to establish care.  She is a 77 year old woman with a past medical history significant for anxiety, peripheral neuropathy, T2DM, OSA, HTN, duodenal carcinoid tumor s/p resection 2014, fibromyalgia, chronic constipation, bilateral knee OA, and GERD.  Previously followed by Josepha Pigg.  Tiffany Ortiz reports feeling fairly well today.  She endorses chronic constipation but is otherwise asymptomatic and has no acute concerns to discuss aside from desiring to establish care.  Chronic medical conditions and outstanding preventative care items discussed today are individually addressed A/P below.  Outpatient Encounter Medications as of 10/31/2022  Medication Sig   amLODipine (NORVASC) 5 MG tablet Take 5 mg by mouth daily.   b complex vitamins capsule Take 1 capsule by mouth daily.   chlorthalidone (HYGROTON) 25 MG tablet chlorthalidone 25 mg tablet   Cholecalciferol 125 MCG (5000 UT) TABS Take 1 capsule by mouth daily.   clonazePAM (KLONOPIN) 0.5 MG tablet Take 0.25 mg by mouth 2 (two) times daily as needed.   Coenzyme Q10 (CO Q-10 PO) Take 1 tablet by mouth daily.   CREON 12000-38000 units CPEP capsule TAKE 2 CAPSULES DAILY WITH EACH MEAL.   fexofenadine (ALLEGRA) 30 MG tablet Take 30 mg by mouth daily.    glipiZIDE (GLUCOTROL XL) 5 MG 24 hr tablet Take 5 mg by mouth daily.   hyoscyamine (LEVBID) 0.375 MG 12 hr tablet Take 0.375 mg by mouth 2 (two) times daily as needed.    linaclotide (LINZESS) 72 MCG capsule TAKE ONE CAPSULE BY MOUTH ONCE DAILY. TAKE 30 MINUTES BEFORE BREAKFAST   Multiple Vitamin (MULTIVITAMIN) tablet Take 1 tablet by mouth daily. With Vit D   Omega-3 Fatty Acids (OMEGA 3 PO) Take 1 capsule by mouth daily. With Vit D   omeprazole (PRILOSEC) 40 MG  capsule Take 40 mg by mouth daily.   oxymetazoline (AFRIN) 0.05 % nasal spray Place 1 spray into both nostrils 2 (two) times daily as needed for congestion.   potassium chloride SA (KLOR-CON M) 20 MEQ tablet Take 1 tablet (20 mEq total) by mouth 2 (two) times daily for 7 days.   Probiotic Product (PROBIOTIC PO) Take 1 tablet by mouth daily.   Propylene Glycol-Glycerin (SOOTHE OP) Place 1 drop into both eyes 2 (two) times daily as needed.    RESTASIS 0.05 % ophthalmic emulsion Place into both eyes 2 (two) times daily.    sertraline (ZOLOFT) 50 MG tablet Take 50 mg by mouth daily.   UNABLE TO FIND Take 1 capsule by mouth daily. Med Name: Phytoceramides   UNABLE TO FIND Take 1 capsule by mouth daily. Med Name: CinnaChroma   UNABLE TO FIND Take 2 capsules by mouth daily. Med Name: PC Liver and Brain   valACYclovir (VALTREX) 500 MG tablet Take 500 mg by mouth as needed. Reported on 10/31/2015   [DISCONTINUED] bismuth subsalicylate (PEPTO BISMOL) 262 MG/15ML suspension Take 30 mLs by mouth as needed.   [DISCONTINUED] diphenoxylate-atropine (LOMOTIL) 2.5-0.025 MG tablet as needed.   [DISCONTINUED] GLUCOSAMINE-CHONDROIT-VIT C-MN PO Take 1 tablet by mouth daily.   [DISCONTINUED] potassium chloride SA (K-DUR) 20 MEQ tablet potassium chloride ER 20 mEq tablet,extended release(part/cryst)   [DISCONTINUED] pravastatin (PRAVACHOL) 20 MG tablet Take 20 mg by mouth at bedtime. "most nights" per  patient   No facility-administered encounter medications on file as of 10/31/2022.    Past Medical History:  Diagnosis Date   Allergy    Anxiety    Arthritis    Cancer (HCC)    carcinoid tumor stomach   Cataract    REMOVED BILATERAL   Depression    Diabetes mellitus    Fibromyalgia    GERD (gastroesophageal reflux disease)    Headache(784.0)    Hyperlipidemia    Hyperplastic colon polyp 02/04/2013   Hypertension    IBS (irritable bowel syndrome)    Osteoporosis    Polyp of rectum 02/04/2013   Renal  disorder    Shortness of breath    Sleep apnea    Vitamin D deficiency     Past Surgical History:  Procedure Laterality Date   ABDOMINAL HYSTERECTOMY     Cervical cancer, with incidental appendectomy   APPENDECTOMY     BIOPSY N/A 02/04/2013   Procedure: BIOPSY;  Surgeon: Corbin Ade, MD;  Location: [facility];  Service: Endoscopy;  Laterality: N/A;  SB BX AND RANDOM COLON BX   bladder tack     BREAST LUMPECTOMY     benign   COLONOSCOPY  09/22/2003   RMR: Diminutive rectal polyps cold biopsy/removed.  Otherwise normal rectal mucosa/Pancolonic diverticula.  The remainder of the colonic mucosa appeared normal   COLONOSCOPY WITH ESOPHAGOGASTRODUODENOSCOPY (EGD) N/A 02/04/2013   Procedure: COLONOSCOPY WITH ESOPHAGOGASTRODUODENOSCOPY (EGD);  Surgeon: Corbin Ade, MD;  Location: [facility];  Service: Endoscopy;  Laterality: N/A;  10:15-moved to 9:30am   ESOPHAGOGASTRODUODENOSCOPY  11/19/2006   [id number] Schatzki's ring with overlying distal esophageal erosions consistent with erosive reflux esophagitis, status post dilation and disruption of ring as described above/ Small hiatal hernia/Otherwise normal stomach, first duodenum and second duodenum   EUS N/A 02/12/2013   Procedure: UPPER ENDOSCOPIC ULTRASOUND (EUS) LINEAR;  Surgeon: Rachael Fee, MD;  Location: [facility];  Service: Endoscopy;  Laterality: N/A;   EUS N/A 08/20/2013   Procedure: UPPER ENDOSCOPIC ULTRASOUND (EUS) LINEAR;  Surgeon: Rachael Fee, MD;  Location: [facility];  Service: Endoscopy;  Laterality: N/A;   LAPAROSCOPIC SMALL BOWEL RESECTION N/A 03/31/2013   Procedure: LAPAROSCOPIC  ASSISTED CONVERTED TO OPEN RESECTION DUODENAL MASS/UPPER ENDOSCOPY;  Surgeon: Almond Lint, MD;  Location: [facility];  Service: General;  Laterality: N/A;   TONSILLECTOMY      Family History  Problem Relation Age of Onset   Cancer Mother        ?ovarian   Cancer Father    Colon cancer Neg Hx    Colon polyps Neg Hx     Esophageal cancer Neg Hx    Rectal cancer Neg Hx    Stomach cancer Neg Hx    Pancreatic cancer Neg Hx     Social History   Socioeconomic History   Marital status: Married    Spouse name: Not on file   Number of children: 2   Years of education: Not on file   Highest education level: Not on file  Occupational History   Occupation: retired  Tobacco Use   Smoking status: Former    Types: Cigarettes   Smokeless tobacco: Former    Quit date: 02/11/1970  Vaping Use   Vaping Use: Never used  Substance and Sexual Activity   Alcohol use: No   Drug use: No   Sexual activity: Not on file  Other Topics Concern   Not on file  Social History Narrative  Married, to [employer]   #2 grown children   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  HENT:  Negative for sore throat.   Respiratory:  Negative for cough and shortness of breath.   Cardiovascular:  Negative for chest pain, palpitations and leg swelling.  Gastrointestinal:  Positive for constipation. Negative for abdominal pain, blood in stool, diarrhea, nausea and vomiting.  Genitourinary:  Negative for dysuria and hematuria.  Musculoskeletal:  Negative for myalgias.  Skin:  Negative for itching and rash.  Neurological:  Negative for dizziness and headaches.  Psychiatric/Behavioral:  Negative for depression and suicidal ideas.    Objective    BP (!) 150/94   Pulse 63   Ht 5' 1.5" (1.562 m)   Wt 151 lb (68.5 kg)   SpO2 96%   BMI 28.07 kg/m   Physical Exam Vitals reviewed.  Constitutional:      General: She is not in acute distress.    Appearance: Normal  appearance. She is not toxic-appearing.  HENT:     Head: Normocephalic and atraumatic.     Right Ear: External ear normal.     Left Ear: External ear normal.     Nose: Nose normal. No congestion or rhinorrhea.     Mouth/Throat:     Mouth: Mucous membranes are moist.     Pharynx: Oropharynx is clear. No oropharyngeal exudate or posterior oropharyngeal erythema.  Eyes:     General: No scleral icterus.    Extraocular Movements: Extraocular movements intact.     Conjunctiva/sclera: Conjunctivae normal.     Pupils: Pupils are equal, round, and reactive to light.  Cardiovascular:     Rate and Rhythm: Normal rate and regular rhythm.     Pulses: Normal pulses.     Heart sounds: Normal heart sounds. No murmur heard.    No friction rub. No gallop.  Pulmonary:     Effort: Pulmonary effort is normal.     Breath sounds: Normal breath sounds. No wheezing, rhonchi or rales.  Abdominal:     General: Abdomen is flat. Bowel sounds are normal. There is no distension.     Palpations: Abdomen is soft.     Tenderness: There is no abdominal tenderness.  Musculoskeletal:        General: No swelling. Normal range of motion.     Cervical back: Normal range of motion.     Right lower leg: No edema.     Left lower leg: No edema.  Lymphadenopathy:     Cervical: No cervical adenopathy.  Skin:    General: Skin is warm and dry.     Capillary Refill: Capillary refill takes less than 2 seconds.     Coloration: Skin is not jaundiced.  Neurological:     General: No focal deficit present.     Mental Status: She is alert and oriented to person, place, and time.  Psychiatric:        Mood and Affect: Mood normal.        Behavior: Behavior normal.    Assessment & Plan:   Problem List Items Addressed This Visit  Essential hypertension    She is currently prescribed amlodipine 5 mg daily and chlorthalidone 25 mg daily.  Her blood pressure is elevated today, however she did not take her medications prior  to her appointment. -No medication changes have been made today.  We will follow-up in 4 weeks for HTN check.      OSA on CPAP    She endorses nightly compliance with CPAP      GERD (gastroesophageal reflux disease)    She endorses a history of GERD.  Currently prescribed omeprazole, which she feels is ineffective in alleviating her symptoms.      Carcinoid tumor of intestine    S/p resection of the duodenum in 2014.  Margins were negative.  She has not recently experienced any symptoms concerning for recurrence of disease.      Type 2 diabetes mellitus without complications (HCC)    No recent A1c on file.  She is currently prescribed glipizide 5 mg daily.  Reports that labs were updated 2 weeks ago.  No medication changes have been made today.  Previous records requested.      Bilateral primary osteoarthritis of knee    Followed by orthopedic surgery at [facility].  Receives periodic corticosteroid injections.      Constipation    Chronic issue that she believes is poorly controlled.  Followed by gastroenterology.  She endorses chronic constipation today.  She is currently prescribed Linzess, MiraLAX, and Benefiber. -I have recommended that she contact her gastroenterologist to discuss her ongoing symptoms      Anxiety - Primary    She endorses a history of anxiety and is currently prescribed sertraline 50 mg daily as well as Klonopin 0.25 mg twice daily as needed for anxiety relief.  She has previously been prescribed Remeron.  No medication changes have been made today.       Return in about 4 weeks (around 11/28/2022) for HTN, lab review, DM, anxiety.   Billie Lade, MD

## 2022-10-31 NOTE — Patient Instructions (Signed)
It was a pleasure to see you today.  Thank you for giving [place] the opportunity to be involved in your care.  Below is a brief recap of your visit and next steps.  We will plan to see you again in 4 weeks.  Summary You have established care today No medication changes have been made We will follow up in 4 weeks to check your blood pressure, review recent labs, and discuss anxiety and diabetes I recommend calling your GI doctor to schedule follow up due to persistent symptoms

## 2022-11-06 ENCOUNTER — Encounter: Payer: Self-pay | Admitting: Internal Medicine

## 2022-11-06 DIAGNOSIS — E1169 Type 2 diabetes mellitus with other specified complication: Secondary | ICD-10-CM | POA: Insufficient documentation

## 2022-11-06 DIAGNOSIS — G4733 Obstructive sleep apnea (adult) (pediatric): Secondary | ICD-10-CM | POA: Insufficient documentation

## 2022-11-06 DIAGNOSIS — E119 Type 2 diabetes mellitus without complications: Secondary | ICD-10-CM | POA: Insufficient documentation

## 2022-11-06 DIAGNOSIS — I10 Essential (primary) hypertension: Secondary | ICD-10-CM | POA: Insufficient documentation

## 2022-11-06 DIAGNOSIS — M170 Bilateral primary osteoarthritis of knee: Secondary | ICD-10-CM | POA: Insufficient documentation

## 2022-11-06 DIAGNOSIS — F419 Anxiety disorder, unspecified: Secondary | ICD-10-CM | POA: Insufficient documentation

## 2022-11-06 NOTE — Assessment & Plan Note (Signed)
She is currently prescribed amlodipine 5 mg daily and chlorthalidone 25 mg daily.  Her blood pressure is elevated today, however she did not take her medications prior to her appointment. -No medication changes have been made today.  We will follow-up in 4 weeks for HTN check.

## 2022-11-06 NOTE — Assessment & Plan Note (Addendum)
Chronic issue that she believes is poorly controlled.  Followed by gastroenterology.  She endorses chronic constipation today.  She is currently prescribed Linzess, MiraLAX, and Benefiber. -I have recommended that she contact her gastroenterologist to discuss her ongoing symptoms

## 2022-11-06 NOTE — Assessment & Plan Note (Signed)
She endorses a history of GERD.  Currently prescribed omeprazole, which she feels is ineffective in alleviating her symptoms.

## 2022-11-06 NOTE — Assessment & Plan Note (Signed)
She endorses a history of anxiety and is currently prescribed sertraline 50 mg daily as well as Klonopin 0.25 mg twice daily as needed for anxiety relief.  She has previously been prescribed Remeron.  No medication changes have been made today.

## 2022-11-06 NOTE — Assessment & Plan Note (Signed)
She endorses nightly compliance with CPAP 

## 2022-11-06 NOTE — Assessment & Plan Note (Signed)
No recent A1c on file.  She is currently prescribed glipizide 5 mg daily.  Reports that labs were updated 2 weeks ago.  No medication changes have been made today.  Previous records requested.

## 2022-11-06 NOTE — Assessment & Plan Note (Signed)
S/p resection of the duodenum in 2014.  Margins were negative.  She has not recently experienced any symptoms concerning for recurrence of disease.

## 2022-11-06 NOTE — Assessment & Plan Note (Signed)
Followed by orthopedic surgery at [facility].  Receives periodic corticosteroid injections.

## 2022-11-21 ENCOUNTER — Telehealth: Payer: Self-pay | Admitting: Nurse Practitioner

## 2022-11-21 NOTE — Telephone Encounter (Signed)
Patient called state she is having a lot of diarrhea and Gerd symptoms really bad. Seeking to discuss further with a nurse.

## 2022-11-22 NOTE — Telephone Encounter (Signed)
Unable to reach patient, VM not set up.

## 2022-11-23 NOTE — Telephone Encounter (Signed)
Unable to reach patient, VM not set up.

## 2022-11-28 ENCOUNTER — Ambulatory Visit: Payer: PPO | Admitting: Internal Medicine

## 2022-11-28 ENCOUNTER — Encounter: Payer: Self-pay | Admitting: Internal Medicine

## 2022-11-28 VITALS — BP 184/72 | HR 68 | Ht 61.5 in | Wt 148.4 lb

## 2022-11-28 DIAGNOSIS — E1169 Type 2 diabetes mellitus with other specified complication: Secondary | ICD-10-CM | POA: Diagnosis not present

## 2022-11-28 DIAGNOSIS — I10 Essential (primary) hypertension: Secondary | ICD-10-CM

## 2022-11-28 DIAGNOSIS — E119 Type 2 diabetes mellitus without complications: Secondary | ICD-10-CM

## 2022-11-28 DIAGNOSIS — E785 Hyperlipidemia, unspecified: Secondary | ICD-10-CM

## 2022-11-28 DIAGNOSIS — F419 Anxiety disorder, unspecified: Secondary | ICD-10-CM | POA: Diagnosis not present

## 2022-11-28 MED ORDER — PRAVASTATIN SODIUM 20 MG PO TABS: 20.0000 mg | ORAL_TABLET | Freq: Every day | ORAL | 0 refills | Status: DC

## 2022-11-28 MED ORDER — SERTRALINE HCL 100 MG PO TABS: 100.0000 mg | ORAL_TABLET | Freq: Every day | ORAL | Status: DC

## 2022-11-28 MED ORDER — CLONAZEPAM 0.5 MG PO TABS
0.5000 mg | ORAL_TABLET | Freq: Every day | ORAL | 0 refills | Status: DC | PRN
Start: 2022-11-28 — End: 2023-04-08

## 2022-11-28 MED ORDER — LISINOPRIL 10 MG PO TABS
10.0000 mg | ORAL_TABLET | Freq: Every day | ORAL | 3 refills | Status: DC
Start: 2022-11-28 — End: 2023-09-06

## 2022-11-28 MED ORDER — GLIPIZIDE ER 10 MG PO TB24
10.0000 mg | ORAL_TABLET | Freq: Every day | ORAL | 2 refills | Status: DC
Start: 2022-11-28 — End: 2023-01-28

## 2022-11-28 MED ORDER — AMLODIPINE BESYLATE 5 MG PO TABS
5.0000 mg | ORAL_TABLET | Freq: Every day | ORAL | 2 refills | Status: DC
Start: 2022-11-28 — End: 2023-03-25

## 2022-11-28 NOTE — Assessment & Plan Note (Signed)
A1c 8.3 on labs from early June.  She is currently prescribed glipizide 5 mg daily. -Increase glipizide to 10 mg daily -Follow-up in 4 weeks for reassessment -Resuming ACE and statin therapy as otherwise documented.

## 2022-11-28 NOTE — Assessment & Plan Note (Signed)
Lipid panel updated last month.  LDL 76.  She is currently prescribed pravastatin 20 mg daily but states that she has not been taking it recently.  She is concerned for myalgias, but also states that she did not experience any adverse side effects when taking pravastatin consistently. -Through shared decision making, she will resume pravastatin 20 mg daily.  A new prescription has been provided today.

## 2022-11-28 NOTE — Patient Instructions (Signed)
It was a pleasure to see you today.  Thank you for giving [place] the opportunity to be involved in your care.  Below is a brief recap of your visit and next steps.  We will plan to see you again in 4 weeks.  Summary Increase amlodipine to 5 mg daily and add lisinopril 10 mg daily for hypertension Increase glipizide to 10 mg daily for diabetes Resume pravastatin for high cholesterol Increase sertraline to 100 mg daily and clonazepam refilled for anxiety

## 2022-11-28 NOTE — Assessment & Plan Note (Signed)
Her chief concern today is poorly controlled symptoms of anxiety.  She has most recently been prescribed sertraline 50 mg daily as well as clonazepam 0.25 mg twice daily.  She has been out of clonazepam recently and has tried increasing sertraline without significant treatment. -Clonazepam has been refilled today.  PDMP reviewed and is appropriate -I reviewed with Ms. Toenjes that she should consistently take sertraline at a higher dose for 1-2 weeks for an adequate trial of efficacy.  Through shared decision making, sertraline has been increased to 100 mg daily.  We will follow-up in 4 weeks for reassessment.

## 2022-11-28 NOTE — Assessment & Plan Note (Signed)
Poorly controlled.  BP today was 185/79 initially and 184/72 on repeat.  She is currently prescribed amlodipine 2.5 mg daily as well as chlorthalidone 25 mg daily.  She reports taking her medications prior to her appointment today. -Increase amlodipine to 5 mg daily and add lisinopril 10 mg daily.  Patient states that she was previously prescribed losartan, but stopped taking it due to concerns for carcinogenic properties. -Follow-up in 4 weeks for HTN check

## 2022-11-28 NOTE — Progress Notes (Signed)
Established Patient Office Visit  Subjective   Patient ID: Tiffany Ortiz, female    DOB: July 29, 1945  Age: 77 y.o. MRN: [id number]  Chief Complaint  Patient presents with   Hypertension    Follow up   Tiffany Ortiz returns to care today for follow-up of HTN, DM, anxiety, and lab review.  She was last evaluated by me on 6/26 as a new patient presenting to establish care.  Her blood pressure was significantly elevated at time and she reported that she had not taken her antihypertensive medications prior to her appointment.  4-week follow-up was arranged for HTN check.  Labs were requested from her previous PCP.  There have been no acute interval events.  Tiffany Ortiz has multiple concerns to discuss today, however her chief concern is poorly controlled anxiety.  She is currently prescribed sertraline 50 mg daily as well as clonazepam 0.25 mg twice daily.  She has been out of clonazepam recently and feels that her anxiety is poorly controlled.  She has attempted to increase sertraline but did not see any significant improvement in her symptoms.  Past Medical History:  Diagnosis Date   Allergy    Anxiety    Arthritis    Cancer (HCC)    carcinoid tumor stomach   Cataract    REMOVED BILATERAL   Depression    Diabetes mellitus    Fibromyalgia    GERD (gastroesophageal reflux disease)    Headache(784.0)    Hyperlipidemia    Hyperplastic colon polyp 02/04/2013   Hypertension    IBS (irritable bowel syndrome)    Osteoporosis    Polyp of rectum 02/04/2013   Renal disorder    Shortness of breath    Sleep apnea    Vitamin D deficiency    Past Surgical History:  Procedure Laterality Date   ABDOMINAL HYSTERECTOMY     Cervical cancer, with incidental appendectomy   APPENDECTOMY     BIOPSY N/A 02/04/2013   Procedure: BIOPSY;  Surgeon: Corbin Ade, MD;  Location: [facility];  Service: Endoscopy;  Laterality: N/A;  SB BX AND RANDOM COLON BX   bladder tack     BREAST LUMPECTOMY      benign   COLONOSCOPY  09/22/2003   RMR: Diminutive rectal polyps cold biopsy/removed.  Otherwise normal rectal mucosa/Pancolonic diverticula.  The remainder of the colonic mucosa appeared normal   COLONOSCOPY WITH ESOPHAGOGASTRODUODENOSCOPY (EGD) N/A 02/04/2013   Procedure: COLONOSCOPY WITH ESOPHAGOGASTRODUODENOSCOPY (EGD);  Surgeon: Corbin Ade, MD;  Location: [facility];  Service: Endoscopy;  Laterality: N/A;  10:15-moved to 9:30am   ESOPHAGOGASTRODUODENOSCOPY  11/19/2006   GMW:[id number] Schatzki's ring with overlying distal esophageal erosions consistent with erosive reflux esophagitis, status post dilation and disruption of ring as described above/ Small hiatal hernia/Otherwise normal stomach, first duodenum and second duodenum   EUS N/A 02/12/2013   Procedure: UPPER ENDOSCOPIC ULTRASOUND (EUS) LINEAR;  Surgeon: Rachael Fee, MD;  Location: [facility];  Service: Endoscopy;  Laterality: N/A;   EUS N/A 08/20/2013   Procedure: UPPER ENDOSCOPIC ULTRASOUND (EUS) LINEAR;  Surgeon: Rachael Fee, MD;  Location: [facility];  Service: Endoscopy;  Laterality: N/A;   LAPAROSCOPIC SMALL BOWEL RESECTION N/A 03/31/2013   Procedure: LAPAROSCOPIC  ASSISTED CONVERTED TO OPEN RESECTION DUODENAL MASS/UPPER ENDOSCOPY;  Surgeon: Almond Lint, MD;  Location: [facility];  Service: General;  Laterality: N/A;   TONSILLECTOMY     Social History   Tobacco Use   Smoking status: Former    Types:  Cigarettes   Smokeless tobacco: Former    Quit date: 02/11/1970  Vaping Use   Vaping status: Never Used  Substance Use Topics   Alcohol use: No   Drug use: No   Family History  Problem Relation Age of Onset   Cancer Mother        ?ovarian   Cancer Father    Colon cancer Neg Hx    Colon polyps Neg Hx    Esophageal cancer Neg Hx    Rectal cancer Neg Hx    Stomach cancer Neg Hx    Pancreatic cancer Neg Hx    Allergies  Allergen Reactions   Other     Metal alloy-breaks her out-esp. Metal surgical  instruments or some metal implanted in me   [name] [Aspirin]     Kidney doctor told her to not take ASA   Erythromycin     Doesn't remember    Famciclovir     migrane   Hydrocodone     "makes me lose my mind"-hallucinations   Ibuprofen     Kidney doctor told her to not take ibuprofen.   Lactose Intolerance (Gi) Diarrhea and Other (See Comments)    Severe diarrhea   Morphine And Codeine Other (See Comments)    Pt says Morphine makes her go out of her head   Oxycodone     hallucinations   Promethazine Nausea And Vomiting   Tilactase Diarrhea   Tylenol [Acetaminophen]     Knocks her out.   Review of Systems  Psychiatric/Behavioral:  The patient is nervous/anxious.      Objective:     BP (!) 184/72   Pulse 68   Ht 5' 1.5" (1.562 m)   Wt 148 lb 6.4 oz (67.3 kg)   SpO2 97%   BMI 27.59 kg/m  BP Readings from Last 3 Encounters:  11/28/22 (!) 184/72  10/31/22 (!) 150/94  07/19/22 (!) 160/90   Physical Exam Vitals reviewed.  Constitutional:      General: She is not in acute distress.    Appearance: Normal appearance. She is not toxic-appearing.  HENT:     Head: Normocephalic and atraumatic.     Right Ear: External ear normal.     Left Ear: External ear normal.     Nose: Nose normal. No congestion or rhinorrhea.     Mouth/Throat:     Mouth: Mucous membranes are moist.     Pharynx: Oropharynx is clear. No oropharyngeal exudate or posterior oropharyngeal erythema.  Eyes:     General: No scleral icterus.    Extraocular Movements: Extraocular movements intact.     Conjunctiva/sclera: Conjunctivae normal.     Pupils: Pupils are equal, round, and reactive to light.  Cardiovascular:     Rate and Rhythm: Normal rate and regular rhythm.     Pulses: Normal pulses.     Heart sounds: Normal heart sounds. No murmur heard.    No friction rub. No gallop.  Pulmonary:     Effort: Pulmonary effort is normal.     Breath sounds: Normal breath sounds. No wheezing, rhonchi or rales.   Abdominal:     General: Abdomen is flat. Bowel sounds are normal. There is no distension.     Palpations: Abdomen is soft.     Tenderness: There is no abdominal tenderness.  Musculoskeletal:        General: No swelling. Normal range of motion.     Cervical back: Normal range of motion.     Right  lower leg: No edema.     Left lower leg: No edema.  Lymphadenopathy:     Cervical: No cervical adenopathy.  Skin:    General: Skin is warm and dry.     Capillary Refill: Capillary refill takes less than 2 seconds.     Coloration: Skin is not jaundiced.  Neurological:     General: No focal deficit present.     Mental Status: She is alert and oriented to person, place, and time.  Psychiatric:        Mood and Affect: Mood normal.        Behavior: Behavior normal.      Assessment & Plan:   Problem List Items Addressed This Visit       Essential hypertension - Primary    Poorly controlled.  BP today was 185/79 initially and 184/72 on repeat.  She is currently prescribed amlodipine 2.5 mg daily as well as chlorthalidone 25 mg daily.  She reports taking her medications prior to her appointment today. -Increase amlodipine to 5 mg daily and add lisinopril 10 mg daily.  Patient states that she was previously prescribed losartan, but stopped taking it due to concerns for carcinogenic properties. -Follow-up in 4 weeks for HTN check      Type 2 diabetes mellitus without complications (HCC)    A1c 8.3 on labs from early June.  She is currently prescribed glipizide 5 mg daily. -Increase glipizide to 10 mg daily -Follow-up in 4 weeks for reassessment -Resuming ACE and statin therapy as otherwise documented.      Hyperlipidemia associated with type 2 diabetes mellitus (HCC)    Lipid panel updated last month.  LDL 76.  She is currently prescribed pravastatin 20 mg daily but states that she has not been taking it recently.  She is concerned for myalgias, but also states that she did not experience  any adverse side effects when taking pravastatin consistently. -Through shared decision making, she will resume pravastatin 20 mg daily.  A new prescription has been provided today.      Anxiety    Her chief concern today is poorly controlled symptoms of anxiety.  She has most recently been prescribed sertraline 50 mg daily as well as clonazepam 0.25 mg twice daily.  She has been out of clonazepam recently and has tried increasing sertraline without significant treatment. -Clonazepam has been refilled today.  PDMP reviewed and is appropriate -I reviewed with Tiffany Ortiz that she should consistently take sertraline at a higher dose for 1-2 weeks for an adequate trial of efficacy.  Through shared decision making, sertraline has been increased to 100 mg daily.  We will follow-up in 4 weeks for reassessment.       Return in about 4 weeks (around 12/26/2022) for HTN, DM, Anxiety.    Billie Lade, MD

## 2022-12-04 ENCOUNTER — Telehealth: Payer: Self-pay | Admitting: Nurse Practitioner

## 2022-12-04 NOTE — Telephone Encounter (Signed)
Inbound call from patient stating she has been out of Creon medication for awhile now and requesting a refill. Patient also requesting a call back to discuss constipation and diarrhea she has been experiencing. Please advise, thank you.

## 2022-12-05 ENCOUNTER — Other Ambulatory Visit: Payer: Self-pay

## 2022-12-05 DIAGNOSIS — K8689 Other specified diseases of pancreas: Secondary | ICD-10-CM

## 2022-12-05 MED ORDER — PANCRELIPASE (LIP-PROT-AMYL) 12000-38000 UNITS PO CPEP: ORAL_CAPSULE | ORAL | 0 refills | Status: DC

## 2022-12-05 NOTE — Telephone Encounter (Signed)
Pt daughter Graciella Belton  made aware of Alcide Evener NP recommendations. Prescription sent to pharmacy. Dianne made aware Dianne  verbalized understanding with all questions answered.

## 2022-12-05 NOTE — Telephone Encounter (Signed)
Pt of Dr. Christella Hartigan. Pt states that she is out of her creon for about 3 weeks now. Requesting a prescription.  Pt stated that she has been having issues with constipation and diarrhea since 2014 after a surgery went bad. Pt was scheduled for an office visit with Alcide Evener NP on 02/25/2023.  Please advise on creon prescription:

## 2022-12-05 NOTE — Telephone Encounter (Signed)
Tiffany Ortiz, ok to refill Creon as previously prescribed, 3 month supply no refills. Patient to reduce Creon if she develops constipation. THX.

## 2022-12-20 ENCOUNTER — Other Ambulatory Visit: Payer: Self-pay

## 2022-12-20 DIAGNOSIS — Z79899 Other long term (current) drug therapy: Secondary | ICD-10-CM

## 2022-12-26 ENCOUNTER — Ambulatory Visit: Payer: PPO | Admitting: Internal Medicine

## 2022-12-28 ENCOUNTER — Encounter: Payer: Self-pay | Admitting: Internal Medicine

## 2023-01-25 ENCOUNTER — Telehealth: Payer: Self-pay | Admitting: Internal Medicine

## 2023-01-25 NOTE — Telephone Encounter (Signed)
Pt needs refill on glipiZIDE (GLUCOTROL XL) 10 MG 24 hr tablet   [facility] [place], [place] - [address] [place], [place] [address] Phone: [phone]  Fax: [phone]  Needs provider override

## 2023-01-28 ENCOUNTER — Other Ambulatory Visit: Payer: Self-pay

## 2023-01-28 DIAGNOSIS — E119 Type 2 diabetes mellitus without complications: Secondary | ICD-10-CM

## 2023-01-28 MED ORDER — GLIPIZIDE ER 10 MG PO TB24
10.0000 mg | ORAL_TABLET | Freq: Every day | ORAL | 2 refills | Status: DC
Start: 2023-01-28 — End: 2023-06-19

## 2023-01-28 NOTE — Telephone Encounter (Signed)
Refills sent to pharmacy. 

## 2023-01-31 ENCOUNTER — Other Ambulatory Visit: Payer: Self-pay | Admitting: Nurse Practitioner

## 2023-01-31 ENCOUNTER — Telehealth: Payer: Self-pay | Admitting: Internal Medicine

## 2023-01-31 NOTE — Telephone Encounter (Signed)
Pt asking for magnesium rx. Do you want her to just get this OTC if needed?

## 2023-01-31 NOTE — Telephone Encounter (Signed)
Spoke to patient

## 2023-01-31 NOTE — Telephone Encounter (Signed)
Patient called in LVM requesting script for magnesium

## 2023-02-18 LAB — HM DIABETES EYE EXAM

## 2023-02-20 ENCOUNTER — Ambulatory Visit (INDEPENDENT_AMBULATORY_CARE_PROVIDER_SITE_OTHER): Payer: PPO

## 2023-02-20 VITALS — Ht 61.0 in | Wt 144.0 lb

## 2023-02-20 DIAGNOSIS — Z0000 Encounter for general adult medical examination without abnormal findings: Secondary | ICD-10-CM

## 2023-02-20 DIAGNOSIS — Z1231 Encounter for screening mammogram for malignant neoplasm of breast: Secondary | ICD-10-CM

## 2023-02-20 DIAGNOSIS — Z1159 Encounter for screening for other viral diseases: Secondary | ICD-10-CM | POA: Diagnosis not present

## 2023-02-20 DIAGNOSIS — Z780 Asymptomatic menopausal state: Secondary | ICD-10-CM

## 2023-02-20 NOTE — Patient Instructions (Addendum)
Tiffany Ortiz , Thank you for taking time to come for your Medicare Wellness Visit. I appreciate your ongoing commitment to your health goals. Please review the following plan we discussed and let me know if I can assist you in the future.   Referrals/Orders/Follow-Ups/Clinician Recommendations:  Next Medicare Annual Wellness Visit: February 24, 2024 at 10:00 am virtual visit  You have an order for:  []   2D Mammogram  [x]   3D Mammogram  [x]   Bone Density   []   Lung Cancer Screening  Please call for appointment:   [facility] at [facility] [address] [place], [place] [address] [phone]  Make sure to wear two-piece clothing.  No lotions powders or deodorants the day of the appointment Make sure to bring picture ID and insurance card.  Bring list of medications you are currently taking including any supplements.   Schedule your [place] screening mammogram through MyChart!   Log into your MyChart account.  Go to 'Visit' (or 'Appointments' if on mobile App) --> Schedule an Appointment  Under 'Select a Reason for Visit' choose the Mammogram Screening option.  Complete the pre-visit questions and select the time and place that best fits your schedule.  A Hepatitis C Screening has been ordered for you today. You do not have to fast to have this lab drawn.  Have this lab drawn at Dr. Darletta Moll office.   This is a list of the screening recommended for you and due dates:  Health Maintenance  Topic Date Due   Complete foot exam   Never done   Eye exam for diabetics  Never done   Yearly kidney health urinalysis for diabetes  Never done   Hepatitis C Screening  Never done   Zoster (Shingles) Vaccine (1 of 2) Never done   Hemoglobin A1C  09/28/2013   Pneumonia Vaccine (2 of 2 - PPSV23 or PCV20) 10/25/2014   COVID-19 Vaccine (3 - Moderna risk series) 08/17/2019   Flu Shot  12/06/2022   Yearly kidney function blood test for diabetes  07/19/2023   Medicare  Annual Wellness Visit  02/20/2024   DTaP/Tdap/Td vaccine (2 - Td or Tdap) 07/29/2029   DEXA scan (bone density measurement)  Completed   HPV Vaccine  Aged Out   Colon Cancer Screening  Discontinued    Advanced directives: (Declined) Advance directive discussed with you today. Even though you declined this today, please call our office should you change your mind, and we can give you the proper paperwork for you to fill out.  Next Medicare Annual Wellness Visit scheduled for next year: Yes  Preventive Care 42 Years and Older, Female Preventive care refers to lifestyle choices and visits with your health care provider that can promote health and wellness. Preventive care visits are also called wellness exams. What can I expect for my preventive care visit? Counseling Your health care provider may ask you questions about your: Medical history, including: Past medical problems. Family medical history. Pregnancy and menstrual history. History of falls. Current health, including: Memory and ability to understand (cognition). Emotional well-being. Home life and relationship well-being. Sexual activity and sexual health. Lifestyle, including: Alcohol, nicotine or tobacco, and drug use. Access to firearms. Diet, exercise, and sleep habits. Work and work Astronomer. Sunscreen use. Safety issues such as seatbelt and bike helmet use. Physical exam Your health care provider will check your: Height and weight. These may be used to calculate your BMI (body mass index). BMI is a measurement that  tells if you are at a healthy weight. Waist circumference. This measures the distance around your waistline. This measurement also tells if you are at a healthy weight and may help predict your risk of certain diseases, such as type 2 diabetes and high blood pressure. Heart rate and blood pressure. Body temperature. Skin for abnormal spots. What immunizations do I need?  Vaccines are usually given  at various ages, according to a schedule. Your health care provider will recommend vaccines for you based on your age, medical history, and lifestyle or other factors, such as travel or where you work. What tests do I need? Screening Your health care provider may recommend screening tests for certain conditions. This may include: Lipid and cholesterol levels. Hepatitis C test. Hepatitis B test. HIV (human immunodeficiency virus) test. STI (sexually transmitted infection) testing, if you are at risk. Lung cancer screening. Colorectal cancer screening. Diabetes screening. This is done by checking your blood sugar (glucose) after you have not eaten for a while (fasting). Mammogram. Talk with your health care provider about how often you should have regular mammograms. BRCA-related cancer screening. This may be done if you have a family history of breast, ovarian, tubal, or peritoneal cancers. Bone density scan. This is done to screen for osteoporosis. Talk with your health care provider about your test results, treatment options, and if necessary, the need for more tests. Follow these instructions at home: Eating and drinking  Eat a diet that includes fresh fruits and vegetables, whole grains, lean protein, and low-fat dairy products. Limit your intake of foods with high amounts of sugar, saturated fats, and salt. Take vitamin and mineral supplements as recommended by your health care provider. Do not drink alcohol if your health care provider tells you not to drink. If you drink alcohol: Limit how much you have to 0-1 drink a day. Know how much alcohol is in your drink. In the [place] one drink equals one 12 oz bottle of beer (355 mL), one 5 oz glass of wine (148 mL), or one 1 oz glass of hard liquor (44 mL). Lifestyle Brush your teeth every morning and night with fluoride toothpaste. Floss one time each day. Exercise for at least 30 minutes 5 or more days each week. Do not use any products  that contain nicotine or tobacco. These products include cigarettes, chewing tobacco, and vaping devices, such as e-cigarettes. If you need help quitting, ask your health care provider. Do not use drugs. If you are sexually active, practice safe sex. Use a condom or other form of protection in order to prevent STIs. Take aspirin only as told by your health care provider. Make sure that you understand how much to take and what form to take. Work with your health care provider to find out whether it is safe and beneficial for you to take aspirin daily. Ask your health care provider if you need to take a cholesterol-lowering medicine (statin). Find healthy ways to manage stress, such as: Meditation, yoga, or listening to music. Journaling. Talking to a trusted person. Spending time with friends and family. Minimize exposure to UV radiation to reduce your risk of skin cancer. Safety Always wear your seat belt while driving or riding in a vehicle. Do not drive: If you have been drinking alcohol. Do not ride with someone who has been drinking. When you are tired or distracted. While texting. If you have been using any mind-altering substances or drugs. Wear a helmet and other protective equipment during sports activities.  If you have firearms in your house, make sure you follow all gun safety procedures. What's next? Visit your health care provider once a year for an annual wellness visit. Ask your health care provider how often you should have your eyes and teeth checked. Stay up to date on all vaccines. This information is not intended to replace advice given to you by your health care provider. Make sure you discuss any questions you have with your health care provider. Document Revised: 10/19/2020 Document Reviewed: 10/19/2020 Elsevier Patient Education  2024 [employer]. Understanding Your Risk for Falls Millions of people have serious injuries from falls each year. It is important to  understand your risk of falling. Talk with your health care provider about your risk and what you can do to lower it. If you do have a serious fall, make sure to tell your provider. Falling once raises your risk of falling again. How can falls affect me? Serious injuries from falls are common. These include: Broken bones, such as hip fractures. Head injuries, such as traumatic brain injuries (TBI) or concussions. A fear of falling can cause you to avoid activities and stay at home. This can make your muscles weaker and raise your risk for a fall. What can increase my risk? There are a number of risk factors that increase your risk for falling. The more risk factors you have, the higher your risk of falling. Serious injuries from a fall happen most often to people who are older than 77 years old. Teenagers and young adults ages 93-29 are also at higher risk. Common risk factors include: Weakness in the lower body. Being generally weak or confused due to long-term (chronic) illness. Dizziness or balance problems. Poor vision. Medicines that cause dizziness or drowsiness. These may include: Medicines for your blood pressure, heart, anxiety, insomnia, or swelling (edema). Pain medicines. Muscle relaxants. Other risk factors include: Drinking alcohol. Having had a fall in the past. Having foot pain or wearing improper footwear. Working at a dangerous job. Having any of the following in your home: Tripping hazards, such as floor clutter or loose rugs. Poor lighting. Pets. Having dementia or memory loss. What actions can I take to lower my risk of falling?     Physical activity Stay physically fit. Do strength and balance exercises. Consider taking a regular class to build strength and balance. Yoga and tai chi are good options. Vision Have your eyes checked every year and your prescription for glasses or contacts updated as needed. Shoes and walking aids Wear non-skid shoes. Wear  shoes that have rubber soles and low heels. Do not wear high heels. Do not walk around the house in socks or slippers. Use a cane or walker as told by your provider. Home safety Attach secure railings on both sides of your stairs. Install grab bars for your bathtub, shower, and toilet. Use a non-skid mat in your bathtub or shower. Attach bath mats securely with double-sided, non-slip rug tape. Use good lighting in all rooms. Keep a flashlight near your bed. Make sure there is a clear path from your bed to the bathroom. Use night-lights. Do not use throw rugs. Make sure all carpeting is taped or tacked down securely. Remove all clutter from walkways and stairways, including extension cords. Repair uneven or broken steps and floors. Avoid walking on icy or slippery surfaces. Walk on the grass instead of on icy or slick sidewalks. Use ice melter to get rid of ice on walkways in the winter. Use  a cordless phone. Questions to ask your health care provider Can you help me check my risk for a fall? Do any of my medicines make me more likely to fall? Should I take a vitamin D supplement? What exercises can I do to improve my strength and balance? Should I make an appointment to have my vision checked? Do I need a bone density test to check for weak bones (osteoporosis)? Would it help to use a cane or a walker? Where to find more information Centers for Disease Control and Prevention, [employer]: TonerPromos.no Community-Based Fall Prevention Programs: TonerPromos.no [employer] on Aging: BaseRingTones.pl Contact a health care provider if: You fall at home. You are afraid of falling at home. You feel weak, drowsy, or dizzy. This information is not intended to replace advice given to you by your health care provider. Make sure you discuss any questions you have with your health care provider. Document Revised: 12/25/2021 Document Reviewed: 12/25/2021 Elsevier Patient Education  2024 Elsevier Inc. Bone  Density Test A bone density test uses a type of X-ray to measure the amount of calcium and other minerals in a person's bones. It can measure bone density in the hip and the spine. The test is similar to having a regular X-ray. This test may also be called: Bone densitometry. Bone mineral density test. Dual-energy X-ray absorptiometry (DEXA). You may have this test to: Diagnose a condition that causes weak or thin bones (osteoporosis). Screen you for osteoporosis. Predict your risk for a broken bone (fracture). Determine how well your osteoporosis treatment is working. Tell a health care provider about: Any allergies you have. All medicines you are taking, including vitamins, herbs, eye drops, creams, and over-the-counter medicines. Any problems you or family members have had with anesthetic medicines. Any blood disorders you have. Any surgeries you have had. Any medical conditions you have. Whether you are pregnant or may be pregnant. Any medical tests you have had within the past 14 days that used contrast material. What are the risks? Generally, this is a safe test. However, it does expose you to a small amount of radiation, which can slightly increase your cancer risk. What happens before the test? Do not take any calcium supplements within the 24 hours before your test. You will need to remove all metal jewelry, eyeglasses, removable dental appliances, and any other metal objects on your body. What happens during the test?  You will lie down on an exam table. There will be an X-ray generator below you and an imaging device above you. Other devices, such as boxes or braces, may be used to position your body properly for the scan. The machine will slowly scan your body. You will need to keep very still while the machine does the scan. The images will show up on a screen in the room. Images will be examined by a specialist after your test is finished. The procedure may vary among  health care providers and hospitals. What can I expect after the test? It is up to you to get the results of your test. Ask your health care provider, or the department that is doing the test, when your results will be ready. Summary A bone density test is an imaging test that uses a type of X-ray to measure the amount of calcium and other minerals in your bones. The test may be used to diagnose or screen you for a condition that causes weak or thin bones (osteoporosis), predict your risk for a broken bone (fracture),  or determine how well your osteoporosis treatment is working. Do not take any calcium supplements within 24 hours before your test. Ask your health care provider, or the department that is doing the test, when your results will be ready. This information is not intended to replace advice given to you by your health care provider. Make sure you discuss any questions you have with your health care provider. Document Revised: 01/04/2021 Document Reviewed: 10/08/2019 Elsevier Patient Education  2024 [employer].

## 2023-02-20 NOTE — Progress Notes (Signed)
Because this visit was a virtual/telehealth visit,  certain criteria was not obtained, such a blood pressure, CBG if applicable, and timed get up and go. Any medications not marked as "taking" were not mentioned during the medication reconciliation part of the visit. Any vitals not documented were not able to be obtained due to this being a telehealth visit or patient was unable to self-report a recent blood pressure reading due to a lack of equipment at home via telehealth. Vitals that have been documented are verbally provided by the patient.   Subjective:   Tiffany Ortiz is a 77 y.o. female who presents for Medicare Annual (Subsequent) preventive examination.  Visit Complete: Virtual I connected with  Golden Pop on 02/20/23 by a audio enabled telemedicine application and verified that I am speaking with the correct person using two identifiers.  Patient Location: Home  Provider Location: Home Office  I discussed the limitations of evaluation and management by telemedicine. The patient expressed understanding and agreed to proceed.  Vital Signs: Because this visit was a virtual/telehealth visit, some criteria may be missing or patient reported. Any vitals not documented were not able to be obtained and vitals that have been documented are patient reported.  Patient Medicare AWV questionnaire was completed by the patient on na; I have confirmed that all information answered by patient is correct and no changes since this date.  Cardiac Risk Factors include: advanced age (>10men, >42 women);diabetes mellitus;dyslipidemia;hypertension;sedentary lifestyle     Objective:    Today's Vitals   02/20/23 1000 02/20/23 1004  Weight: 144 lb (65.3 kg)   Height: 5\' 1"  (1.549 m)   PainSc:  5    Body mass index is 27.21 kg/m.     02/20/2023    9:59 AM 10/05/2020    3:00 PM 08/31/2019   11:47 AM 07/11/2015    8:58 PM 03/08/2015   10:26 AM 09/27/2014    9:36 AM 07/28/2014   11:27 AM   Advanced Directives  Does Patient Have a Medical Advance Directive? No Yes Yes No No Yes No  Type of Special educational needs teacher of [place];Living will Healthcare Power of [place];Living will   Living will   Does patient want to make changes to medical advance directive?  No - Patient declined No - Patient declined      Copy of Healthcare Power of Attorney in Chart?  No - copy requested No - copy requested   No - copy requested   Would patient like information on creating a medical advance directive? No - Patient declined   Yes - Transport planner given Yes - Transport planner given  No - patient declined information    Current Medications (verified) Outpatient Encounter Medications as of 02/20/2023  Medication Sig   amLODipine (NORVASC) 5 MG tablet Take 1 tablet (5 mg total) by mouth daily.   b complex vitamins capsule Take 1 capsule by mouth daily.   chlorthalidone (HYGROTON) 25 MG tablet chlorthalidone 25 mg tablet   Cholecalciferol 125 MCG (5000 UT) TABS Take 1 capsule by mouth daily.   clonazePAM (KLONOPIN) 0.5 MG tablet Take 1 tablet (0.5 mg total) by mouth daily as needed for anxiety.   Coenzyme Q10 (CO Q-10 PO) Take 1 tablet by mouth daily.   fexofenadine (ALLEGRA) 30 MG tablet Take 30 mg by mouth daily.    glipiZIDE (GLUCOTROL XL) 10 MG 24 hr tablet Take 1 tablet (10 mg total) by mouth daily with breakfast.   hyoscyamine (LEVBID) 0.375  MG 12 hr tablet Take 0.375 mg by mouth 2 (two) times daily as needed.    LINZESS 72 MCG capsule TAKE ONE CAPSULE BY MOUTH ONCE DAILY. TAKE 30 MINUTES BEFORE BREAKFAST   lipase/protease/amylase (CREON) 12000-38000 units CPEP capsule Take 2 capsules daily with each meal   lisinopril (ZESTRIL) 10 MG tablet Take 1 tablet (10 mg total) by mouth daily.   Multiple Vitamin (MULTIVITAMIN) tablet Take 1 tablet by mouth daily. With Vit D   Omega-3 Fatty Acids (OMEGA 3 PO) Take 1 capsule by mouth daily. With Vit D   omeprazole (PRILOSEC) 40 MG  capsule Take 40 mg by mouth daily.   oxymetazoline (AFRIN) 0.05 % nasal spray Place 1 spray into both nostrils 2 (two) times daily as needed for congestion.   potassium chloride SA (KLOR-CON M) 20 MEQ tablet Take 1 tablet (20 mEq total) by mouth 2 (two) times daily for 7 days.   pravastatin (PRAVACHOL) 20 MG tablet Take 1 tablet (20 mg total) by mouth daily.   Probiotic Product (PROBIOTIC PO) Take 1 tablet by mouth daily.   Propylene Glycol-Glycerin (SOOTHE OP) Place 1 drop into both eyes 2 (two) times daily as needed.    RESTASIS 0.05 % ophthalmic emulsion Place into both eyes 2 (two) times daily.    sertraline (ZOLOFT) 100 MG tablet Take 1 tablet (100 mg total) by mouth daily.   UNABLE TO FIND Take 1 capsule by mouth daily. Med Name: Phytoceramides   UNABLE TO FIND Take 1 capsule by mouth daily. Med Name: CinnaChroma   UNABLE TO FIND Take 2 capsules by mouth daily. Med Name: PC Liver and Brain   valACYclovir (VALTREX) 500 MG tablet Take 500 mg by mouth as needed. Reported on 10/31/2015   No facility-administered encounter medications on file as of 02/20/2023.    Allergies (verified) Other, Asa [aspirin], Erythromycin, Famciclovir, Hydrocodone, Ibuprofen, Lactose intolerance (gi), Morphine and codeine, Oxycodone, Promethazine, Tilactase, and Tylenol [acetaminophen]   History: Past Medical History:  Diagnosis Date   Allergy    Anxiety    Arthritis    Cancer (HCC)    carcinoid tumor stomach   Cataract    REMOVED BILATERAL   Depression    Diabetes mellitus    Fibromyalgia    GERD (gastroesophageal reflux disease)    Headache(784.0)    Hyperlipidemia    Hyperplastic colon polyp 02/04/2013   Hypertension    IBS (irritable bowel syndrome)    Osteoporosis    Polyp of rectum 02/04/2013   Renal disorder    Shortness of breath    Sleep apnea    Vitamin D deficiency    Past Surgical History:  Procedure Laterality Date   ABDOMINAL HYSTERECTOMY     Cervical cancer, with incidental  appendectomy   APPENDECTOMY     BIOPSY N/A 02/04/2013   Procedure: BIOPSY;  Surgeon: Corbin Ade, MD;  [facility];  Service: Endoscopy;  Laterality: N/A;  SB BX AND RANDOM COLON BX   bladder tack     BREAST LUMPECTOMY     benign   COLONOSCOPY  09/22/2003   RMR: Diminutive rectal polyps cold biopsy/removed.  Otherwise normal rectal mucosa/Pancolonic diverticula.  The remainder of the colonic mucosa appeared normal   COLONOSCOPY WITH ESOPHAGOGASTRODUODENOSCOPY (EGD) N/A 02/04/2013   Procedure: COLONOSCOPY WITH ESOPHAGOGASTRODUODENOSCOPY (EGD);  Surgeon: Corbin Ade, MD;  [facility];  Service: Endoscopy;  Laterality: N/A;  10:15-moved to 9:30am   ESOPHAGOGASTRODUODENOSCOPY  11/19/2006   [id number] Schatzki's ring  with overlying distal esophageal erosions consistent with erosive reflux esophagitis, status post dilation and disruption of ring as described above/ Small hiatal hernia/Otherwise normal stomach, first duodenum and second duodenum   EUS N/A 02/12/2013   Procedure: UPPER ENDOSCOPIC ULTRASOUND (EUS) LINEAR;  Surgeon: Rachael Fee, MD;  Location: [facility];  Service: Endoscopy;  Laterality: N/A;   EUS N/A 08/20/2013   Procedure: UPPER ENDOSCOPIC ULTRASOUND (EUS) LINEAR;  Surgeon: Rachael Fee, MD;  Location: [facility];  Service: Endoscopy;  Laterality: N/A;   LAPAROSCOPIC SMALL BOWEL RESECTION N/A 03/31/2013   Procedure: LAPAROSCOPIC  ASSISTED CONVERTED TO OPEN RESECTION DUODENAL MASS/UPPER ENDOSCOPY;  Surgeon: Almond Lint, MD;  Location: [facility];  Service: General;  Laterality: N/A;   TONSILLECTOMY     Family History  Problem Relation Age of Onset   Cancer Mother        ?ovarian   Cancer Father    Colon cancer Neg Hx    Colon polyps Neg Hx    Esophageal cancer Neg Hx    Rectal cancer Neg Hx    Stomach cancer Neg Hx    Pancreatic cancer Neg Hx    Social History   Socioeconomic History   Marital status: Married    Spouse name: Not on  file   Number of children: 2   Years of education: Not on file   Highest education level: Not on file  Occupational History   Occupation: retired  Tobacco Use   Smoking status: Former    Types: Cigarettes   Smokeless tobacco: Former    Quit date: 02/11/1970  Vaping Use   Vaping status: Never Used  Substance and Sexual Activity   Alcohol use: No   Drug use: No   Sexual activity: Not on file  Other Topics Concern   Not on file  Social History Narrative   Married, to [employer]   #2 grown children   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   Social Determinants of Health   Financial Resource Strain: High Risk (02/20/2023)   Overall Financial Resource Strain (CARDIA)    Difficulty of Paying Living Expenses: Hard  Food Insecurity: No Food Insecurity (02/20/2023)   Hunger Vital Sign    Worried About Running Out of Food in the Last Year: Never true    Ran Out of Food in the Last Year: Never true  Transportation Needs: No Transportation Needs (02/20/2023)   PRAPARE - Administrator, Civil Service (Medical): No    Lack of Transportation (Non-Medical): No  Physical Activity: Sufficiently Active (02/20/2023)   Exercise Vital Sign    Days of Exercise per Week: 7 days    Minutes of Exercise per Session: 30 min  Stress: No Stress Concern Present (02/20/2023)   [employer] of Occupational Health - Occupational Stress Questionnaire    Feeling of Stress : Not at all  Social Connections: Moderately Integrated (02/20/2023)   Social Connection and Isolation Panel [NHANES]    Frequency of Communication with Friends and Family: More than three times a week    Frequency of Social Gatherings with Friends and Family: More than three times a week    Attends Religious Services: More than 4 times per year    Active Member of [employer] or Organizations: No    Attends Banker Meetings: Never    Marital Status: Married    Tobacco  Counseling Counseling given: Yes   Clinical Intake:  Pre-visit preparation completed:  Yes  Pain : 0-10 Pain Score: 5  Pain Type: Chronic pain Pain Location: Knee Pain Orientation: Right, Left (right > left) Pain Descriptors / Indicators: Aching, Constant, Shooting, Stabbing Pain Onset: More than a month ago Pain Frequency: Constant     BMI - recorded: 27.21 Nutritional Status: BMI 25 -29 Overweight Nutritional Risks: None Diabetes: Yes CBG done?: No (telehealth visit. unable to obtain cbg) Did pt. bring in CBG monitor from home?: No  How often do you need to have someone help you when you read instructions, pamphlets, or other written materials from your doctor or pharmacy?: 1 - Never  Interpreter Needed?: No  Information entered by :: Abby Sarra Rachels, CMA   Activities of Daily Living    02/20/2023   10:08 AM  In your present state of health, do you have any difficulty performing the following activities:  Hearing? 0  Vision? 1  Comment Dr. Desiree Lucy  Difficulty concentrating or making decisions? 0  Walking or climbing stairs? 1  Comment uses cane  Dressing or bathing? 0  Doing errands, shopping? 1  Comment pt no longer drives so her husband takes her to do her errands  Preparing Food and eating ? N  Using the Toilet? N  In the past six months, have you accidently leaked urine? N  Do you have problems with loss of bowel control? N  Managing your Medications? N  Managing your Finances? N  Housekeeping or managing your Housekeeping? N    Patient Care Team: Billie Lade, MD as PCP - General (Internal Medicine) Almond Lint, MD as Consulting Physician (General Surgery) Rachael Fee, MD as Attending Physician (Gastroenterology)  Indicate any recent Medical Services you may have received from other than [facility] providers in the past year (date may be approximate).     Assessment:   This is a routine wellness examination for  [place].  Hearing/Vision screen Hearing Screening - Comments:: Patient denies any hearing difficulties.   Vision Screening - Comments:: Wears rx glasses - up to date with routine eye exams with Dr. Desiree Lucy   Goals Addressed             This Visit's Progress    Patient Stated       Get rid of diabetes and neuropathy       Depression Screen    02/20/2023   10:09 AM 10/31/2022    3:22 PM 07/28/2014   11:29 AM  PHQ 2/9 Scores  PHQ - 2 Score 1 4 1   PHQ- 9 Score 7 15     Fall Risk    02/20/2023   10:19 AM 10/31/2022    3:21 PM 01/03/2016    2:35 PM 07/28/2014   11:29 AM 03/16/2014   10:54 AM  Fall Risk   Falls in the past year? 1 1 Yes Yes Yes  Comment   Emmi Telephone Survey: data to providers prior to load    Number falls in past yr: 1 1 2  or more  2 or more  Comment   Emmi Telephone Survey Actual Response = 3    Injury with Fall? 1 1 No    Risk Factor Category      High Fall Risk  Risk for fall due to : History of fall(s);Impaired balance/gait;Orthopedic patient;Impaired mobility Impaired balance/gait;Impaired mobility  Impaired balance/gait History of fall(s);Impaired balance/gait  Follow up Education provided;Falls prevention discussed Falls evaluation completed       MEDICARE RISK AT HOME: Medicare Risk at Home  Any stairs in or around the home?: Yes If so, are there any without handrails?: No Home free of loose throw rugs in walkways, pet beds, electrical cords, etc?: Yes Adequate lighting in your home to reduce risk of falls?: Yes Life alert?: No Use of a cane, walker or w/c?: Yes Grab bars in the bathroom?: No Shower chair or bench in shower?: Yes Elevated toilet seat or a handicapped toilet?: No  TIMED UP AND GO:  Was the test performed?  No    Cognitive Function:        02/20/2023   10:07 AM  6CIT Screen  What Year? 0 points  What month? 0 points  What time? 0 points  Count back from 20 0 points  Months in reverse 0 points  Repeat  phrase 0 points  Total Score 0 points    Immunizations Immunization History  Administered Date(s) Administered   Influenza-Unspecified 02/04/2018   Moderna Sars-Covid-2 Vaccination 06/18/2019, 07/20/2019   Pneumococcal Conjugate-13 08/30/2014   Tdap 07/30/2019    TDAP status: Up to date  Flu Vaccine status: Due, Education has been provided regarding the importance of this vaccine. Advised may receive this vaccine at local pharmacy or Health Dept. Aware to provide a copy of the vaccination record if obtained from local pharmacy or Health Dept. Verbalized acceptance and understanding.  Pneumococcal vaccine status: Due, Education has been provided regarding the importance of this vaccine. Advised may receive this vaccine at local pharmacy or Health Dept. Aware to provide a copy of the vaccination record if obtained from local pharmacy or Health Dept. Verbalized acceptance and understanding.  Covid-19 vaccine status: Information provided on how to obtain vaccines.   Qualifies for Shingles Vaccine? Yes   Zostavax completed No   Shingrix Completed?: No.    Education has been provided regarding the importance of this vaccine. Patient has been advised to call insurance company to determine out of pocket expense if they have not yet received this vaccine. Advised may also receive vaccine at local pharmacy or Health Dept. Verbalized acceptance and understanding.  Screening Tests Health Maintenance  Topic Date Due   FOOT EXAM  Never done   OPHTHALMOLOGY EXAM  Never done   Diabetic kidney evaluation - Urine ACR  Never done   Hepatitis C Screening  Never done   Zoster Vaccines- Shingrix (1 of 2) Never done   HEMOGLOBIN A1C  09/28/2013   Pneumonia Vaccine 57+ Years old (2 of 2 - PPSV23 or PCV20) 10/25/2014   COVID-19 Vaccine (3 - Moderna risk series) 08/17/2019   INFLUENZA VACCINE  12/06/2022   Medicare Annual Wellness (AWV)  06/06/2023   Diabetic kidney evaluation - eGFR measurement   07/19/2023   DTaP/Tdap/Td (2 - Td or Tdap) 07/29/2029   DEXA SCAN  Completed   HPV VACCINES  Aged Out   Colonoscopy  Discontinued    Health Maintenance  Health Maintenance Due  Topic Date Due   FOOT EXAM  Never done   OPHTHALMOLOGY EXAM  Never done   Diabetic kidney evaluation - Urine ACR  Never done   Hepatitis C Screening  Never done   Zoster Vaccines- Shingrix (1 of 2) Never done   HEMOGLOBIN A1C  09/28/2013   Pneumonia Vaccine 15+ Years old (2 of 2 - PPSV23 or PCV20) 10/25/2014   COVID-19 Vaccine (3 - Moderna risk series) 08/17/2019   INFLUENZA VACCINE  12/06/2022    Colorectal cancer screening: No longer required.   Mammogram status: Ordered 02/20/2023. Pt provided with  contact info and advised to call to schedule appt.   Bone Density status: Ordered 02/20/2023. Pt provided with contact info and advised to call to schedule appt.  Lung Cancer Screening: (Low Dose CT Chest recommended if Age 36-80 years, 20 pack-year currently smoking OR have quit w/in 15years.) does not qualify.   Lung Cancer Screening Referral: na  Additional Screening:  Hepatitis C Screening: does qualify; ordered 02/20/2023  Vision Screening: Recommended annual ophthalmology exams for early detection of glaucoma and other disorders of the eye. Is the patient up to date with their annual eye exam?  Yes  Who is the provider or what is the name of the office in which the patient attends annual eye exams? Dr. Desiree Lucy If pt is not established with a provider, would they like to be referred to a provider to establish care? No .   Dental Screening: Recommended annual dental exams for proper oral hygiene  Diabetic Foot Exam: Due, provider notified of need for foot exam or referral to podiatry  Community Resource Referral / Chronic Care Management: CRR required this visit?  No   CCM required this visit?  No     Plan:     I have personally reviewed and noted the following in the patient's  chart:   Medical and social history Use of alcohol, tobacco or illicit drugs  Current medications and supplements including opioid prescriptions. Patient is not currently taking opioid prescriptions. Functional ability and status Nutritional status Physical activity Advanced directives List of other physicians Hospitalizations, surgeries, and ER visits in previous 12 months Vitals Screenings to include cognitive, depression, and falls Referrals and appointments  In addition, I have reviewed and discussed with patient certain preventive protocols, quality metrics, and best practice recommendations. A written personalized care plan for preventive services as well as general preventive health recommendations were provided to patient.     Jordan Hawks Lennix Rotundo, CMA   02/20/2023   After Visit Summary: (Mail) Due to this being a telephonic visit, the after visit summary with patients personalized plan was offered to patient via mail   Nurse Notes: see routing comment

## 2023-02-25 ENCOUNTER — Other Ambulatory Visit (INDEPENDENT_AMBULATORY_CARE_PROVIDER_SITE_OTHER): Payer: PPO

## 2023-02-25 ENCOUNTER — Ambulatory Visit: Payer: PPO | Admitting: Nurse Practitioner

## 2023-02-25 ENCOUNTER — Encounter: Payer: Self-pay | Admitting: Nurse Practitioner

## 2023-02-25 VITALS — BP 150/80 | HR 67 | Ht 62.0 in | Wt 146.4 lb

## 2023-02-25 DIAGNOSIS — R197 Diarrhea, unspecified: Secondary | ICD-10-CM

## 2023-02-25 DIAGNOSIS — K219 Gastro-esophageal reflux disease without esophagitis: Secondary | ICD-10-CM

## 2023-02-25 DIAGNOSIS — R1084 Generalized abdominal pain: Secondary | ICD-10-CM | POA: Diagnosis not present

## 2023-02-25 DIAGNOSIS — K5900 Constipation, unspecified: Secondary | ICD-10-CM | POA: Diagnosis not present

## 2023-02-25 DIAGNOSIS — Z860102 Personal history of hyperplastic colon polyps: Secondary | ICD-10-CM

## 2023-02-25 DIAGNOSIS — Z8500 Personal history of malignant neoplasm of unspecified digestive organ: Secondary | ICD-10-CM

## 2023-02-25 LAB — COMPREHENSIVE METABOLIC PANEL
ALT: 27 U/L (ref 0–35)
AST: 22 U/L (ref 0–37)
Albumin: 3.9 g/dL (ref 3.5–5.2)
Alkaline Phosphatase: 84 U/L (ref 39–117)
BUN: 16 mg/dL (ref 6–23)
CO2: 28 meq/L (ref 19–32)
Calcium: 9.6 mg/dL (ref 8.4–10.5)
Chloride: 102 meq/L (ref 96–112)
Creatinine, Ser: 0.96 mg/dL (ref 0.40–1.20)
GFR: 57.2 mL/min — ABNORMAL LOW (ref >60.00)
Glucose, Bld: 201 mg/dL — ABNORMAL HIGH (ref 70–99)
Potassium: 3.5 meq/L (ref 3.5–5.1)
Sodium: 139 meq/L (ref 135–145)
Total Bilirubin: 0.3 mg/dL (ref 0.2–1.2)
Total Protein: 7.1 g/dL (ref 6.0–8.3)

## 2023-02-25 LAB — TSH: TSH: 0.69 u[IU]/mL (ref 0.35–5.50)

## 2023-02-25 NOTE — Patient Instructions (Addendum)
_______________________________________________________  If your blood pressure at your visit was 140/90 or greater, please contact your primary care physician to follow up on this.  _______________________________________________________  If you are age 77 or older, your body mass index should be between 23-30. Your Body mass index is 26.78 kg/m. If this is out of the aforementioned range listed, please consider follow up with your Primary Care Provider.  If you are age 38 or younger, your body mass index should be between 19-25. Your Body mass index is 26.78 kg/m. If this is out of the aformentioned range listed, please consider follow up with your Primary Care Provider.   ________________________________________________________  The [facility] providers would like to encourage you to use [facility] to communicate with providers for non-urgent requests or questions.  Due to long hold times on the telephone, sending your provider a message by [facility] may be a faster and more efficient way to get a response.  Please allow 48 business hours for a response.  Please remember that this is for non-urgent requests.  _______________________________________________________  Your provider has requested that you go to the basement level for lab work before leaving today. Press "B" on the elevator. The lab is located at the first door on the left as you exit the elevator.   Record all bowel movements (type and frequency) for 1 week please. You can send [place] a mychart message with this recording  Take Linzess 1 daily 30 minutes before breakfast.  Take omeprazole 40mg  1 capsule daily 30 minutes before breakfast  At bedtime take pepcid 20mg  daily at bedtime. Can increase to 2 tablets (40mg ) daily at bedtime in one week if needed.  Thank you for trusting me with your gastrointestinal care!   Alcide Evener, CRNP

## 2023-02-25 NOTE — Progress Notes (Unsigned)
02/27/2023 Tiffany Ortiz [id number] Feb 02, 1946   Chief Complaint: Constipation   History of Present Illness: Tiffany Ortiz is a 77 year old female with a past medical history of anxiety, depression, arthritis, fibromyalgia, hypertension, duodenal carcinoid, pancreatic insufficiency, GERD and colon polyps. I last saw [name] in office on 07/19/2022 due to having constipation and diarrhea with infrequent left sided abdominal pain. At that time, she was instructed to take Linzess daily three days weekly, Benefiber every day and Miralax PRN with plans to empirically treat for possible SIBO. She presents today for follow up regarding constipation and diarrhea. She is accompanied by her daughter. Patient is a challenging historian and she has difficulty describing her bowel pattern but stated some days she has constipation and some days she has diarrhea. She has intermittent vague generalized abdominal pain. She takes Linzess as needed, if she hasn't passed a BM for a few days. She has less abdominal pain after she takes Linzess. No bloody or black stools. When she feels constipated, she describes feeling like stool moves up the rectum instead of out the rectum. She denies having night time diarrhea. No sweats or flushing. No recent antibiotics. She has frequent heartburn despite taking Omeprazole 40mg  every day and Famotidine 20mg  once or twice daily. Pancreatic elastase level 312 on 08/07/2022. She believes she is still taking Creon. Her most recent chromogranin A level was 399.3 on 12/05/2022. No recent abdominal imaging. No weight loss.     Latest Ref Rng & Units 02/25/2023    4:13 PM 07/19/2022    4:02 PM 10/31/2015   11:57 AM  CMP  Glucose 70 - 99 mg/dL 366  440  347   BUN 6 - 23 mg/dL 16  23  27    Creatinine 0.40 - 1.20 mg/dL 4.25  9.56  3.87   Sodium 135 - 145 mEq/L 139  142  141   Potassium 3.5 - 5.1 mEq/L 3.5  3.1  3.1   Chloride 96 - 112 mEq/L 102  103  109   CO2  19 - 32 mEq/L 28  29  24    Calcium 8.4 - 10.5 mg/dL 9.6  9.8  56.4   Total Protein 6.0 - 8.3 g/dL 7.1  7.2    Total Bilirubin 0.2 - 1.2 mg/dL 0.3  0.4    Alkaline Phos 39 - 117 U/L 84  80    AST 0 - 37 U/L 22  38    ALT 0 - 35 U/L 27  39         Latest Ref Rng & Units 07/19/2022    4:02 PM 04/05/2013    5:08 AM 04/04/2013    5:00 AM  CBC  WBC 4.0 - 10.5 K/uL 8.2  9.9  11.9   Hemoglobin 12.0 - 15.0 g/dL 33.2  95.1  88.4   Hematocrit 36.0 - 46.0 % 42.3  31.9  32.4   Platelets 150.0 - 400.0 K/uL 328.0  307  305     Current Outpatient Medications on File Prior to Visit  Medication Sig Dispense Refill   amLODipine (NORVASC) 5 MG tablet Take 1 tablet (5 mg total) by mouth daily. 30 tablet 2   b complex vitamins capsule Take 1 capsule by mouth daily.     chlorthalidone (HYGROTON) 25 MG tablet chlorthalidone 25 mg tablet     Cholecalciferol 125 MCG (5000 UT) TABS Take 1 capsule by mouth daily.     clonazePAM (KLONOPIN) 0.5 MG tablet  Take 1 tablet (0.5 mg total) by mouth daily as needed for anxiety. 30 tablet 0   Coenzyme Q10 (CO Q-10 PO) Take 1 tablet by mouth daily.     fexofenadine (ALLEGRA) 30 MG tablet Take 30 mg by mouth daily.      glipiZIDE (GLUCOTROL XL) 10 MG 24 hr tablet Take 1 tablet (10 mg total) by mouth daily with breakfast. 30 tablet 2   hyoscyamine (LEVBID) 0.375 MG 12 hr tablet Take 0.375 mg by mouth 2 (two) times daily as needed.      LINZESS 72 MCG capsule TAKE ONE CAPSULE BY MOUTH ONCE DAILY. TAKE 30 MINUTES BEFORE BREAKFAST (Patient taking differently: daily as needed.) 30 capsule 2   lipase/protease/amylase (CREON) 12000-38000 units CPEP capsule Take 2 capsules daily with each meal 540 capsule 0   lisinopril (ZESTRIL) 10 MG tablet Take 1 tablet (10 mg total) by mouth daily. 90 tablet 3   Multiple Vitamin (MULTIVITAMIN) tablet Take 1 tablet by mouth daily. With Vit D     Omega-3 Fatty Acids (OMEGA 3 PO) Take 1 capsule by mouth daily. With Vit D     omeprazole (PRILOSEC)  40 MG capsule Take 40 mg by mouth daily.     oxymetazoline (AFRIN) 0.05 % nasal spray Place 1 spray into both nostrils 2 (two) times daily as needed for congestion.     Probiotic Product (PROBIOTIC PO) Take 1 tablet by mouth daily.     Propylene Glycol-Glycerin (SOOTHE OP) Place 1 drop into both eyes 2 (two) times daily as needed.      RESTASIS 0.05 % ophthalmic emulsion Place into both eyes 2 (two) times daily.      sertraline (ZOLOFT) 100 MG tablet Take 1 tablet (100 mg total) by mouth daily.     UNABLE TO FIND Take 1 capsule by mouth daily. Med Name: Phytoceramides     UNABLE TO FIND Take 1 capsule by mouth daily. Med Name: CinnaChroma     UNABLE TO FIND Take 2 capsules by mouth daily. Med Name: PC Liver and Brain     valACYclovir (VALTREX) 500 MG tablet Take 500 mg by mouth as needed. Reported on 10/31/2015     potassium chloride SA (KLOR-CON M) 20 MEQ tablet Take 1 tablet (20 mEq total) by mouth 2 (two) times daily for 7 days. 14 tablet 0   pravastatin (PRAVACHOL) 20 MG tablet Take 1 tablet (20 mg total) by mouth daily. (Patient not taking: Reported on 02/25/2023) 90 tablet 0   No current facility-administered medications on file prior to visit.   Allergies  Allergen Reactions   Other     Metal alloy-breaks her out-esp. Metal surgical instruments or some metal implanted in me   [name] [Aspirin]     Kidney doctor told her to not take ASA   Erythromycin     Doesn't remember    Famciclovir     migrane   Hydrocodone     "makes me lose my mind"-hallucinations   Ibuprofen     Kidney doctor told her to not take ibuprofen.   Lactose Intolerance (Gi) Diarrhea and Other (See Comments)    Severe diarrhea   Morphine And Codeine Other (See Comments)    Pt says Morphine makes her go out of her head   Oxycodone     hallucinations   Promethazine Nausea And Vomiting   Tilactase Diarrhea   Tylenol [Acetaminophen]     Knocks her out.   Current Medications, Allergies, Past Medical History, Past  Surgical History, Family History and Social History were reviewed in [employer] record.  Review of Systems:   Constitutional: Negative for fever, sweats, chills or weight loss.  Respiratory: Negative for shortness of breath.   Cardiovascular: Negative for chest pain, palpitations and leg swelling.  Gastrointestinal: See HPI.  Musculoskeletal: Negative for back pain or muscle aches.  Neurological: Negative for dizziness, headaches or paresthesias.    Physical Exam: BP (!) 150/80   Pulse 67   Ht 5\' 2"  (1.575 m)   Wt 146 lb 7 oz (66.4 kg)   BMI 26.78 kg/m   Wt Readings from Last 3 Encounters:  02/25/23 146 lb 7 oz (66.4 kg)  02/20/23 144 lb (65.3 kg)  11/28/22 148 lb 6.4 oz (67.3 kg)    General: 77 year old female in no acute distress. Head: Normocephalic and atraumatic. Eyes: No scleral icterus. Conjunctiva pink . Ears: Normal auditory acuity. Mouth: Dentition intact. No ulcers or lesions.  Lungs: Clear throughout to auscultation. Heart: Regular rate and rhythm, no murmur. Abdomen: Soft, nontender and nondistended. No masses or hepatomegaly. Normal bowel sounds x 4 quadrants.  Rectal: Deferred.  Musculoskeletal: Symmetrical with no gross deformities. Extremities: No edema. Neurological: Alert oriented x 4. No focal deficits.  Psychological: Alert and cooperative. Normal mood and affect  Assessment and Recommendations:  77 year old female with alternating constipation and diarrhea with intermittent generalized abdominal pain. Constipation precedes cycles of diarrhea . -Patient to record frequency and consistency of daily bowel movements x 1 to 2 weeks  -Linzess one tab to be taken 30 minutes before breakfast -Miralax Q HS as needed -Benefiber 1 tbsp QD -Patient/daughter to contact me in 2 weeks with an update, if variable bowel pattern persists will empirically treat for SIBO with Metronidazole 250mg  po tid x 14 days as previously recommended by  Dr. Myrtie Neither -CTAP if abdominal pain persists or worsens    GERD, benign esophageal stricture with chronic dysphagia. S/P esophageal dilatation per EGD in 2020.  Recent increased acid reflux. No current issues with dysphagia. -GERD diet -Continue Omeprazole 40mg  Q AM, add Famotidine 20mg  po at bedtime. 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  -Continue follow up with Dr. Truett Perna    Hyperplastic rectal and colon polyp per colonoscopy 02/2013 -Colonoscopy due 02/2023, she does not wish to pursue any further colonoscopies.    Abdominal sonogram 12/21/2021 showed a coarse hepatic echotexture, nonspecific indicating fatty infiltration vs cirrhosis. Less likely cirrhosis with normal platelet count.   History of pancreatic insufficiency on Creon

## 2023-02-28 ENCOUNTER — Encounter: Payer: Self-pay | Admitting: Internal Medicine

## 2023-02-28 ENCOUNTER — Ambulatory Visit: Payer: PPO | Admitting: Internal Medicine

## 2023-02-28 ENCOUNTER — Other Ambulatory Visit: Payer: Self-pay

## 2023-02-28 VITALS — BP 133/75 | HR 61 | Temp 98.2°F | Ht 62.0 in | Wt 146.6 lb

## 2023-02-28 DIAGNOSIS — R944 Abnormal results of kidney function studies: Secondary | ICD-10-CM | POA: Insufficient documentation

## 2023-02-28 DIAGNOSIS — F419 Anxiety disorder, unspecified: Secondary | ICD-10-CM | POA: Diagnosis not present

## 2023-02-28 DIAGNOSIS — E119 Type 2 diabetes mellitus without complications: Secondary | ICD-10-CM

## 2023-02-28 DIAGNOSIS — Z7984 Long term (current) use of oral hypoglycemic drugs: Secondary | ICD-10-CM

## 2023-02-28 DIAGNOSIS — I10 Essential (primary) hypertension: Secondary | ICD-10-CM

## 2023-02-28 DIAGNOSIS — E785 Hyperlipidemia, unspecified: Secondary | ICD-10-CM

## 2023-02-28 DIAGNOSIS — E1169 Type 2 diabetes mellitus with other specified complication: Secondary | ICD-10-CM

## 2023-02-28 DIAGNOSIS — Z23 Encounter for immunization: Secondary | ICD-10-CM | POA: Diagnosis not present

## 2023-02-28 DIAGNOSIS — Z711 Person with feared health complaint in whom no diagnosis is made: Secondary | ICD-10-CM | POA: Insufficient documentation

## 2023-02-28 NOTE — Progress Notes (Signed)
____________________________________________________________  Attending physician addendum:  Thank you for sending this case to me. I have reviewed the entire note and agree with the plan.  Description of symptoms and Dr. Christella Hartigan' prior office notes suggest these symptoms are chronic and most likely functional in nature.  That said, I agree with an empiric trial of metronidazole for possible SIBO.  Amada Jupiter, MD  ____________________________________________________________

## 2023-02-28 NOTE — Assessment & Plan Note (Signed)
Adequately controlled on current antihypertensive regimen.  No medication changes are indicated today.

## 2023-02-28 NOTE — Assessment & Plan Note (Signed)
Sertraline was increased to 100 mg daily at her last appointment.  She is also prescribed clonazepam for as needed anxiety relief.  She feels as though her anxiety is still poorly controlled, but is not necessarily interested in making medication adjustments.  We discussed a referral to integrated behavioral health for counseling services and she is potentially interested in this option. -Integrated behavioral health referral placed today -Patient and/or legal guardian verbally consented to [facility] services about presenting concerns and psychiatric consultation as appropriate.  The services will be billed as appropriate for the patient

## 2023-02-28 NOTE — Assessment & Plan Note (Signed)
A1c 8.3 on labs from June.  Glipizide was increased to 10 mg daily at her last appointment.  Her blood glucose level was elevated on BMP earlier this week. -Repeat A1c ordered today.  Consider reducing glipizide to 5 mg twice daily.  She endorses previous intolerance to metformin.  Consider adding Marcelline Deist if microalbuminuria is present or labs remain consistent with CKD. -Podiatry referral placed at patient's request for diabetic foot exam

## 2023-02-28 NOTE — Assessment & Plan Note (Signed)
Influenza vaccine administered today.

## 2023-02-28 NOTE — Assessment & Plan Note (Signed)
Currently prescribed pravastatin 20 mg daily.  Repeat lipid panel ordered today.

## 2023-02-28 NOTE — Assessment & Plan Note (Signed)
GFR 57 on labs from earlier this week.  I suspect that she has underlying chronic kidney disease, however previous documentation is inconclusive.  Patient describes previously being followed by nephrology but states she has not been seen in several years. -Repeat BMP and urine microalbumin/creatinine ratio ordered today.  Consider adding Marcelline Deist if labs remain consistent with CKD.

## 2023-02-28 NOTE — Progress Notes (Signed)
Established Patient Office Visit  Subjective   Patient ID: Tiffany Ortiz, female    DOB: [birth year]  Age: 77 y.o. MRN: [id number]  Chief Complaint  Patient presents with   Follow-up   [name] returns to care today for routine follow-up.  She was last evaluated by me on 7/24.  Amlodipine was increased to 5 mg daily and lisinopril 10 mg daily was added at that time for improved treatment of hypertension.  Glipizide was increased to 10 mg daily for improved treatment of diabetes mellitus.  She also agreed to resume pravastatin 20 mg daily.  Sertraline was increased to 100 mg daily for improved treatment of anxiety.  4-week follow-up was arranged.  In the interim, she has been evaluated by gastroenterology for follow-up.   [name] reports feeling poorly today.  She continues to experience daily anxiety.  She is unsure if her medications are effective or not.  She is accompanied by her husband and daughter today.  Her daughter feels that she may benefit from counseling.  Labs were recently checked by gastroenterology and notable for hyperglycemia and decreased GFR.  [name] also expresses concern regarding her memory.  She is interested in undergoing further testing.  Past Medical History:  Diagnosis Date   Allergy    Anxiety    Arthritis    Cancer (HCC)    carcinoid tumor stomach   Cataract    REMOVED BILATERAL   Depression    Diabetes mellitus    Fibromyalgia    GERD (gastroesophageal reflux disease)    Headache(784.0)    Hyperlipidemia    Hyperplastic colon polyp 02/04/2013   Hypertension    IBS (irritable bowel syndrome)    Osteoporosis    Polyp of rectum 02/04/2013   Renal disorder    Shortness of breath    Sleep apnea    Vitamin D deficiency    Past Surgical History:  Procedure Laterality Date   ABDOMINAL HYSTERECTOMY     Cervical cancer, with incidental appendectomy   APPENDECTOMY     BIOPSY N/A 02/04/2013   Procedure: BIOPSY;  Surgeon: Corbin Ade, MD;   Location: [facility];  Service: Endoscopy;  Laterality: N/A;  SB BX AND RANDOM COLON BX   bladder tack     BREAST LUMPECTOMY     benign   COLONOSCOPY  09/22/2003   RMR: Diminutive rectal polyps cold biopsy/removed.  Otherwise normal rectal mucosa/Pancolonic diverticula.  The remainder of the colonic mucosa appeared normal   COLONOSCOPY WITH ESOPHAGOGASTRODUODENOSCOPY (EGD) N/A 02/04/2013   Procedure: COLONOSCOPY WITH ESOPHAGOGASTRODUODENOSCOPY (EGD);  Surgeon: Corbin Ade, MD;  Location: [facility];  Service: Endoscopy;  Laterality: N/A;  10:15-moved to 9:30am   ESOPHAGOGASTRODUODENOSCOPY  11/19/2006   WUJ:[id number] Schatzki's ring with overlying distal esophageal erosions consistent with erosive reflux esophagitis, status post dilation and disruption of ring as described above/ Small hiatal hernia/Otherwise normal stomach, first duodenum and second duodenum   EUS N/A 02/12/2013   Procedure: UPPER ENDOSCOPIC ULTRASOUND (EUS) LINEAR;  Surgeon: Rachael Fee, MD;  Location: [facility];  Service: Endoscopy;  Laterality: N/A;   EUS N/A 08/20/2013   Procedure: UPPER ENDOSCOPIC ULTRASOUND (EUS) LINEAR;  Surgeon: Rachael Fee, MD;  Location: [facility];  Service: Endoscopy;  Laterality: N/A;   LAPAROSCOPIC SMALL BOWEL RESECTION N/A 03/31/2013   Procedure: LAPAROSCOPIC  ASSISTED CONVERTED TO OPEN RESECTION DUODENAL MASS/UPPER ENDOSCOPY;  Surgeon: Almond Lint, MD;  Location: [facility];  Service: General;  Laterality: N/A;  TONSILLECTOMY     Social History   Tobacco Use   Smoking status: Former    Types: Cigarettes   Smokeless tobacco: Former    Quit date: 02/11/1970  Vaping Use   Vaping status: Never Used  Substance Use Topics   Alcohol use: No   Drug use: No   Family History  Problem Relation Age of Onset   Cancer Mother        ?ovarian   Cancer Father    Colon cancer Neg Hx    Colon polyps Neg Hx    Esophageal cancer Neg Hx    Rectal cancer Neg Hx    Stomach cancer Neg  Hx    Pancreatic cancer Neg Hx    Allergies  Allergen Reactions   Other     Metal alloy-breaks her out-esp. Metal surgical instruments or some metal implanted in me   [name] [Aspirin]     Kidney doctor told her to not take ASA   Erythromycin     Doesn't remember    Famciclovir     migrane   Hydrocodone     "makes me lose my mind"-hallucinations   Ibuprofen     Kidney doctor told her to not take ibuprofen.   Lactose Intolerance (Gi) Diarrhea and Other (See Comments)    Severe diarrhea   Morphine And Codeine Other (See Comments)    Pt says Morphine makes her go out of her head   Oxycodone     hallucinations   Promethazine Nausea And Vomiting   Tilactase Diarrhea   Tylenol [Acetaminophen]     Knocks her out.   Review of Systems  Constitutional:  Negative for chills and fever.  HENT:  Negative for sore throat.   Respiratory:  Negative for cough and shortness of breath.   Cardiovascular:  Negative for chest pain, palpitations and leg swelling.  Gastrointestinal:  Negative for abdominal pain, blood in stool, constipation, diarrhea, nausea and vomiting.  Genitourinary:  Negative for dysuria and hematuria.  Musculoskeletal:  Negative for myalgias.  Skin:  Negative for itching and rash.  Neurological:  Negative for dizziness and headaches.       Memory concerns  Psychiatric/Behavioral:  Negative for depression and suicidal ideas. The patient is nervous/anxious.      Objective:     BP 133/75   Pulse 61   Temp 98.2 F (36.8 C)   Ht 5\' 2"  (1.575 m)   Wt 146 lb 9.6 oz (66.5 kg)   SpO2 95%   BMI 26.81 kg/m  BP Readings from Last 3 Encounters:  02/28/23 133/75  02/25/23 (!) 150/80  11/28/22 (!) 184/72   Physical Exam Vitals reviewed.  Constitutional:      General: She is not in acute distress.    Appearance: Normal appearance. She is not toxic-appearing.  HENT:     Head: Normocephalic and atraumatic.     Right Ear: External ear normal.     Left Ear: External ear  normal.     Nose: Nose normal. No congestion or rhinorrhea.     Mouth/Throat:     Mouth: Mucous membranes are moist.     Pharynx: Oropharynx is clear. No oropharyngeal exudate or posterior oropharyngeal erythema.  Eyes:     General: No scleral icterus.    Extraocular Movements: Extraocular movements intact.     Conjunctiva/sclera: Conjunctivae normal.     Pupils: Pupils are equal, round, and reactive to light.  Cardiovascular:     Rate and Rhythm: Normal rate  and regular rhythm.     Pulses: Normal pulses.     Heart sounds: Normal heart sounds. No murmur heard.    No friction rub. No gallop.  Pulmonary:     Effort: Pulmonary effort is normal.     Breath sounds: Normal breath sounds. No wheezing, rhonchi or rales.  Abdominal:     General: Abdomen is flat. Bowel sounds are normal. There is no distension.     Palpations: Abdomen is soft.     Tenderness: There is no abdominal tenderness.  Musculoskeletal:        General: No swelling. Normal range of motion.     Cervical back: Normal range of motion.     Right lower leg: No edema.     Left lower leg: No edema.  Lymphadenopathy:     Cervical: No cervical adenopathy.  Skin:    General: Skin is warm and dry.     Capillary Refill: Capillary refill takes less than 2 seconds.     Coloration: Skin is not jaundiced.  Neurological:     General: No focal deficit present.     Mental Status: She is alert and oriented to person, place, and time.     Gait: Gait abnormal (Ambulates with cane).  Psychiatric:        Mood and Affect: Mood normal.        Behavior: Behavior normal.   Last CBC Lab Results  Component Value Date   WBC 8.2 07/19/2022   HGB 14.5 07/19/2022   HCT 42.3 07/19/2022   MCV 87.9 07/19/2022   MCH 29.5 04/05/2013   RDW 13.6 07/19/2022   PLT 328.0 07/19/2022   Last metabolic panel Lab Results  Component Value Date   GLUCOSE 201 (H) 02/25/2023   NA 139 02/25/2023   K 3.5 02/25/2023   CL 102 02/25/2023   CO2 28  02/25/2023   BUN 16 02/25/2023   CREATININE 0.96 02/25/2023   GFR 57.20 (L) 02/25/2023   CALCIUM 9.6 02/25/2023   PROT 7.1 02/25/2023   ALBUMIN 3.9 02/25/2023   BILITOT 0.3 02/25/2023   ALKPHOS 84 02/25/2023   AST 22 02/25/2023   ALT 27 02/25/2023   Last hemoglobin A1c Lab Results  Component Value Date   HGBA1C 7.0 (H) 03/31/2013   Last thyroid functions Lab Results  Component Value Date   TSH 0.69 02/25/2023     Assessment & Plan:   Problem List Items Addressed This Visit       Essential hypertension    Adequately controlled on current antihypertensive regimen.  No medication changes are indicated today.      Type 2 diabetes mellitus without complications (HCC)    A1c 8.3 on labs from June.  Glipizide was increased to 10 mg daily at her last appointment.  Her blood glucose level was elevated on BMP earlier this week. -Repeat A1c ordered today.  Consider reducing glipizide to 5 mg twice daily.  She endorses previous intolerance to metformin.  Consider adding Marcelline Deist if microalbuminuria is present or labs remain consistent with CKD. -Podiatry referral placed at patient's request for diabetic foot exam      Hyperlipidemia associated with type 2 diabetes mellitus (HCC)    Currently prescribed pravastatin 20 mg daily.  Repeat lipid panel ordered today.      Anxiety    Sertraline was increased to 100 mg daily at her last appointment.  She is also prescribed clonazepam for as needed anxiety relief.  She feels as though her anxiety  is still poorly controlled, but is not necessarily interested in making medication adjustments.  We discussed a referral to integrated behavioral health for counseling services and she is potentially interested in this option. -Integrated behavioral health referral placed today -Patient and/or legal guardian verbally consented to [facility] services about presenting concerns and psychiatric consultation as appropriate.  The  services will be billed as appropriate for the patient       Decreased GFR    GFR 57 on labs from earlier this week.  I suspect that she has underlying chronic kidney disease, however previous documentation is inconclusive.  Patient describes previously being followed by nephrology but states she has not been seen in several years. -Repeat BMP and urine microalbumin/creatinine ratio ordered today.  Consider adding Marcelline Deist if labs remain consistent with CKD.      Need for influenza vaccination - Primary    Influenza vaccine administered today      Concern about memory    Patient expresses concern about her memory.  States that she forgets things on occasion.  No difficulty in performing ADLs.  She is not responsible for managing finances or driving.  She is interested in further evaluation. -Follow-up in 4 weeks for MoCA       Return in about 4 weeks (around 03/28/2023) for MoCA/lab review.    Billie Lade, MD

## 2023-02-28 NOTE — Assessment & Plan Note (Signed)
Patient expresses concern about her memory.  States that she forgets things on occasion.  No difficulty in performing ADLs.  She is not responsible for managing finances or driving.  She is interested in further evaluation. -Follow-up in 4 weeks for MoCA

## 2023-02-28 NOTE — Patient Instructions (Addendum)
It was a pleasure to see you today.  Thank you for giving [place] the opportunity to be involved in your care.  Below is a brief recap of your visit and next steps.  FOLLOW UP 4 WEEKS FOR MOCA  Summary Integrated behavioral health referral placed today Repeat labs ordered Flu shot today  Follow up in 3 months

## 2023-03-02 LAB — MICROALBUMIN / CREATININE URINE RATIO
Creatinine, Urine: 112.2 mg/dL
Microalb/Creat Ratio: 297 mg/g{creat} — ABNORMAL HIGH (ref 0–29)
Microalbumin, Urine: 333.7 ug/mL

## 2023-03-18 ENCOUNTER — Ambulatory Visit: Payer: Medicare Other | Admitting: Podiatry

## 2023-03-25 ENCOUNTER — Other Ambulatory Visit: Payer: Self-pay | Admitting: Internal Medicine

## 2023-03-25 DIAGNOSIS — I10 Essential (primary) hypertension: Secondary | ICD-10-CM

## 2023-04-01 ENCOUNTER — Telehealth: Payer: Self-pay

## 2023-04-01 NOTE — Telephone Encounter (Signed)
Do not see anything in chart where someone tried to contact her

## 2023-04-01 NOTE — Telephone Encounter (Signed)
Copied from CRM [id number]. Topic: General - Call Back - No Documentation >> Apr 01, 2023  9:24 AM Gaetano Hawthorne wrote: Reason for CRM: Patient missed a call from the clinic a few minutes and was hoping to get in touch with whoever was trying to reach her.

## 2023-04-08 ENCOUNTER — Encounter: Payer: Self-pay | Admitting: Internal Medicine

## 2023-04-08 ENCOUNTER — Ambulatory Visit (INDEPENDENT_AMBULATORY_CARE_PROVIDER_SITE_OTHER): Payer: PPO | Admitting: Internal Medicine

## 2023-04-08 VITALS — BP 122/80 | HR 74 | Ht 62.0 in | Wt 148.4 lb

## 2023-04-08 DIAGNOSIS — F419 Anxiety disorder, unspecified: Secondary | ICD-10-CM

## 2023-04-08 DIAGNOSIS — Z711 Person with feared health complaint in whom no diagnosis is made: Secondary | ICD-10-CM

## 2023-04-08 DIAGNOSIS — E1129 Type 2 diabetes mellitus with other diabetic kidney complication: Secondary | ICD-10-CM | POA: Diagnosis not present

## 2023-04-08 DIAGNOSIS — Z7984 Long term (current) use of oral hypoglycemic drugs: Secondary | ICD-10-CM | POA: Diagnosis not present

## 2023-04-08 DIAGNOSIS — D3A098 Benign carcinoid tumors of other sites: Secondary | ICD-10-CM

## 2023-04-08 DIAGNOSIS — R809 Proteinuria, unspecified: Secondary | ICD-10-CM

## 2023-04-08 DIAGNOSIS — E119 Type 2 diabetes mellitus without complications: Secondary | ICD-10-CM

## 2023-04-08 DIAGNOSIS — K8689 Other specified diseases of pancreas: Secondary | ICD-10-CM

## 2023-04-08 MED ORDER — PANCRELIPASE (LIP-PROT-AMYL) 12000-38000 UNITS PO CPEP: ORAL_CAPSULE | ORAL | 0 refills | Status: DC

## 2023-04-08 MED ORDER — VALACYCLOVIR HCL 500 MG PO TABS: 500.0000 mg | ORAL_TABLET | ORAL | 1 refills | Status: AC | PRN

## 2023-04-08 MED ORDER — CLONAZEPAM 0.5 MG PO TABS: 0.2500 mg | ORAL_TABLET | Freq: Two times a day (BID) | ORAL | 0 refills | Status: DC | PRN

## 2023-04-08 MED ORDER — DAPAGLIFLOZIN PROPANEDIOL 5 MG PO TABS: 5.0000 mg | ORAL_TABLET | Freq: Every day | ORAL | 2 refills | Status: DC

## 2023-04-08 NOTE — Assessment & Plan Note (Signed)
MoCA test administered today.  She performed quite well, only missing points for delayed recall.  No additional workup/evaluation is indicated at this time.  We reviewed daily measures to maintain cognition, such as regular physical exercise and engaging in mentally stimulating activities, such as reading.

## 2023-04-08 NOTE — Assessment & Plan Note (Signed)
A1c 8.3 on labs from June.  She is currently prescribed glipizide 10 mg daily.  Farxiga 5 mg daily has been added today in the setting of microalbuminuria.  Repeat A1c is pending.

## 2023-04-08 NOTE — Progress Notes (Signed)
Established Patient Office Visit  Subjective   Patient ID: Tiffany Ortiz, female    DOB: Feb 13, 1946  Age: 77 y.o. MRN: [id number]  Chief Complaint  Patient presents with   lab review    Four week follow up    Tiffany Ortiz returns to care today for MoCA assessment and lab review.  She was last evaluated by me on 10/24.  No medication changes are made at that time, a referral was placed to integrated behavioral health, and close follow-up was arranged for MoCA assessment.  She expressed concern about her memory, noting that she is forgetful on occasion.  She requested further evaluation.  There have been no acute interval events. Tiffany Ortiz reports feeling fairly well today.  Her acute concern is poorly controlled anxiety.  She was previously prescribed clonazepam 0.25 mg twice daily.  She has been out since late May and did not request a refill.  She has difficulty sleeping at night.  Her additional concern today is requesting refills of Valtrex and Creon.  Past Medical History:  Diagnosis Date   Allergy    Anxiety    Arthritis    Cancer (HCC)    carcinoid tumor stomach   Cataract    REMOVED BILATERAL   Depression    Diabetes mellitus    Fibromyalgia    GERD (gastroesophageal reflux disease)    Headache(784.0)    Hyperlipidemia    Hyperplastic colon polyp 02/04/2013   Hypertension    IBS (irritable bowel syndrome)    Osteoporosis    Polyp of rectum 02/04/2013   Renal disorder    Shortness of breath    Sleep apnea    Vitamin D deficiency    Past Surgical History:  Procedure Laterality Date   ABDOMINAL HYSTERECTOMY     Cervical cancer, with incidental appendectomy   APPENDECTOMY     BIOPSY N/A 02/04/2013   Procedure: BIOPSY;  Surgeon: Corbin Ade, MD;  [facility]: [facility];  Service: Endoscopy;  Laterality: N/A;  SB BX AND RANDOM COLON BX   bladder tack     BREAST LUMPECTOMY     benign   COLONOSCOPY  09/22/2003   RMR: Diminutive rectal polyps cold biopsy/removed.   Otherwise normal rectal mucosa/Pancolonic diverticula.  The remainder of the colonic mucosa appeared normal   COLONOSCOPY WITH ESOPHAGOGASTRODUODENOSCOPY (EGD) N/A 02/04/2013   Procedure: COLONOSCOPY WITH ESOPHAGOGASTRODUODENOSCOPY (EGD);  Surgeon: Corbin Ade, MD;  Location: [facility];  Service: Endoscopy;  Laterality: N/A;  10:15-moved to 9:30am   ESOPHAGOGASTRODUODENOSCOPY  11/19/2006   WNU:[id number] Schatzki's ring with overlying distal esophageal erosions consistent with erosive reflux esophagitis, status post dilation and disruption of ring as described above/ Small hiatal hernia/Otherwise normal stomach, first duodenum and second duodenum   EUS N/A 02/12/2013   Procedure: UPPER ENDOSCOPIC ULTRASOUND (EUS) LINEAR;  Surgeon: Rachael Fee, MD;  Location: [facility];  Service: Endoscopy;  Laterality: N/A;   EUS N/A 08/20/2013   Procedure: UPPER ENDOSCOPIC ULTRASOUND (EUS) LINEAR;  Surgeon: Rachael Fee, MD;  Location: [facility];  Service: Endoscopy;  Laterality: N/A;   LAPAROSCOPIC SMALL BOWEL RESECTION N/A 03/31/2013   Procedure: LAPAROSCOPIC  ASSISTED CONVERTED TO OPEN RESECTION DUODENAL MASS/UPPER ENDOSCOPY;  Surgeon: Almond Lint, MD;  Location: [facility];  Service: General;  Laterality: N/A;   TONSILLECTOMY     Social History   Tobacco Use   Smoking status: Former    Types: Cigarettes   Smokeless tobacco: Former    Quit date: 02/11/1970  Vaping Use   Vaping status: Never Used  Substance Use Topics   Alcohol use: No   Drug use: No   Family History  Problem Relation Age of Onset   Cancer Mother        ?ovarian   Cancer Father    Colon cancer Neg Hx    Colon polyps Neg Hx    Esophageal cancer Neg Hx    Rectal cancer Neg Hx    Stomach cancer Neg Hx    Pancreatic cancer Neg Hx    Allergies  Allergen Reactions   Other     Metal alloy-breaks her out-esp. Metal surgical instruments or some metal implanted in me   [name] [Aspirin]     Kidney doctor told her to not  take ASA   Erythromycin     Doesn't remember    Famciclovir     migrane   Hydrocodone     "makes me lose my mind"-hallucinations   Ibuprofen     Kidney doctor told her to not take ibuprofen.   Lactose Intolerance (Gi) Diarrhea and Other (See Comments)    Severe diarrhea   Morphine And Codeine Other (See Comments)    Pt says Morphine makes her go out of her head   Oxycodone     hallucinations   Promethazine Nausea And Vomiting   Tilactase Diarrhea   Tylenol [Acetaminophen]     Knocks her out.   Review of Systems  Musculoskeletal:  Positive for joint pain (Bilateral knee pain).  Psychiatric/Behavioral:  The patient is nervous/anxious.   All other systems reviewed and are negative.    Objective:     BP 122/80 (BP Location: Right Arm, Patient Position: Sitting, Cuff Size: Normal)   Pulse 74   Ht 5\' 2"  (1.575 m)   Wt 148 lb 6.4 oz (67.3 kg)   SpO2 96%   BMI 27.14 kg/m  BP Readings from Last 3 Encounters:  04/08/23 122/80  02/28/23 133/75  02/25/23 (!) 150/80   Physical Exam Vitals reviewed.  Constitutional:      General: She is not in acute distress.    Appearance: Normal appearance. She is not toxic-appearing.  HENT:     Head: Normocephalic and atraumatic.     Right Ear: External ear normal.     Left Ear: External ear normal.     Nose: Nose normal. No congestion or rhinorrhea.     Mouth/Throat:     Mouth: Mucous membranes are moist.     Pharynx: Oropharynx is clear. No oropharyngeal exudate or posterior oropharyngeal erythema.  Eyes:     General: No scleral icterus.    Extraocular Movements: Extraocular movements intact.     Conjunctiva/sclera: Conjunctivae normal.     Pupils: Pupils are equal, round, and reactive to light.  Cardiovascular:     Rate and Rhythm: Normal rate and regular rhythm.     Pulses: Normal pulses.     Heart sounds: Normal heart sounds. No murmur heard.    No friction rub. No gallop.  Pulmonary:     Effort: Pulmonary effort is normal.      Breath sounds: Normal breath sounds. No wheezing, rhonchi or rales.  Abdominal:     General: Abdomen is flat. Bowel sounds are normal. There is no distension.     Palpations: Abdomen is soft.     Tenderness: There is no abdominal tenderness.  Musculoskeletal:        General: No swelling. Normal range of motion.  Cervical back: Normal range of motion.     Right lower leg: No edema.     Left lower leg: No edema.  Lymphadenopathy:     Cervical: No cervical adenopathy.  Skin:    General: Skin is warm and dry.     Capillary Refill: Capillary refill takes less than 2 seconds.     Coloration: Skin is not jaundiced.  Neurological:     General: No focal deficit present.     Mental Status: She is alert and oriented to person, place, and time.     Gait: Gait abnormal (Ambulates with cane).  Psychiatric:        Mood and Affect: Mood normal.        Behavior: Behavior normal.   Last CBC Lab Results  Component Value Date   WBC 8.2 07/19/2022   HGB 14.5 07/19/2022   HCT 42.3 07/19/2022   MCV 87.9 07/19/2022   MCH 29.5 04/05/2013   RDW 13.6 07/19/2022   PLT 328.0 07/19/2022   Last metabolic panel Lab Results  Component Value Date   GLUCOSE 201 (H) 02/25/2023   NA 139 02/25/2023   K 3.5 02/25/2023   CL 102 02/25/2023   CO2 28 02/25/2023   BUN 16 02/25/2023   CREATININE 0.96 02/25/2023   GFR 57.20 (L) 02/25/2023   CALCIUM 9.6 02/25/2023   PROT 7.1 02/25/2023   ALBUMIN 3.9 02/25/2023   BILITOT 0.3 02/25/2023   ALKPHOS 84 02/25/2023   AST 22 02/25/2023   ALT 27 02/25/2023   Last hemoglobin A1c Lab Results  Component Value Date   HGBA1C 7.0 (H) 03/31/2013   Last thyroid functions Lab Results  Component Value Date   TSH 0.69 02/25/2023     Assessment & Plan:   Problem List Items Addressed This Visit       Type 2 diabetes mellitus without complications (HCC) - Primary    A1c 8.3 on labs from June.  She is currently prescribed glipizide 10 mg daily.  Farxiga 5 mg  daily has been added today in the setting of microalbuminuria.  Repeat A1c is pending.      Microalbuminuria due to type 2 diabetes mellitus (HCC)    Noted on urine microalbumin/creatinine ratio obtained in October.  GFR 57 on previous labs.  Repeat BMP ordered but is still pending.  I suspect she has CKD in addition to microalbuminuria.  She is currently on lisinopril.  Farxiga 5 mg daily has been added today.      Anxiety    She is currently taking sertraline 100 mg daily for treatment of anxiety.  She has previously been prescribed clonazepam 0.25 mg twice daily as needed for anxiety relief but has been out since late May.  She did not request a refill upon running out of medication.  She states that her anxiety is poorly controlled currently. -Resume clonazepam 0.25 mg twice daily.  PDMP reviewed.  Continue sertraline as currently prescribed.      Concern about memory    MoCA test administered today.  She performed quite well, only missing points for delayed recall.  No additional workup/evaluation is indicated at this time.  We reviewed daily measures to maintain cognition, such as regular physical exercise and engaging in mentally stimulating activities, such as reading.       Return in about 3 months (around 07/07/2023).    Billie Lade, MD

## 2023-04-08 NOTE — Patient Instructions (Signed)
It was a pleasure to see you today.  Thank you for giving [place] the opportunity to be involved in your care.  Below is a brief recap of your visit and next steps.  We will plan to see you again in 3 months.  Summary Start farxiga 5 mg daily for diabetes  Valtrex / Creon refilled Completed previously ordered labs We will follow up in 3 months

## 2023-04-08 NOTE — Assessment & Plan Note (Signed)
Noted on urine microalbumin/creatinine ratio obtained in October.  GFR 57 on previous labs.  Repeat BMP ordered but is still pending.  I suspect she has CKD in addition to microalbuminuria.  She is currently on lisinopril.  Farxiga 5 mg daily has been added today.

## 2023-04-08 NOTE — Assessment & Plan Note (Signed)
She is currently taking sertraline 100 mg daily for treatment of anxiety.  She has previously been prescribed clonazepam 0.25 mg twice daily as needed for anxiety relief but has been out since late May.  She did not request a refill upon running out of medication.  She states that her anxiety is poorly controlled currently. -Resume clonazepam 0.25 mg twice daily.  PDMP reviewed.  Continue sertraline as currently prescribed.

## 2023-04-09 ENCOUNTER — Other Ambulatory Visit: Payer: Self-pay | Admitting: Internal Medicine

## 2023-04-09 DIAGNOSIS — E1169 Type 2 diabetes mellitus with other specified complication: Secondary | ICD-10-CM

## 2023-04-09 MED ORDER — PRAVASTATIN SODIUM 40 MG PO TABS: 40.0000 mg | ORAL_TABLET | Freq: Every day | ORAL | 3 refills | Status: AC

## 2023-04-10 LAB — CBC WITH DIFFERENTIAL/PLATELET
Basophils Absolute: 0.1 10*3/uL (ref 0.0–0.2)
Basos: 1 %
EOS (ABSOLUTE): 0.3 10*3/uL (ref 0.0–0.4)
Eos: 3 %
Hematocrit: 42.3 % (ref 34.0–46.6)
Hemoglobin: 14.4 g/dL (ref 11.1–15.9)
Immature Grans (Abs): 0 10*3/uL (ref 0.0–0.1)
Immature Granulocytes: 0 %
Lymphocytes Absolute: 3.2 10*3/uL — ABNORMAL HIGH (ref 0.7–3.1)
Lymphs: 35 %
MCH: 30.1 pg (ref 26.6–33.0)
MCHC: 34 g/dL (ref 31.5–35.7)
MCV: 88 fL (ref 79–97)
Monocytes Absolute: 0.7 10*3/uL (ref 0.1–0.9)
Monocytes: 8 %
Neutrophils Absolute: 4.8 10*3/uL (ref 1.4–7.0)
Neutrophils: 53 %
Platelets: 359 10*3/uL (ref 150–450)
RBC: 4.79 x10E6/uL (ref 3.77–5.28)
RDW: 12.4 % (ref 11.7–15.4)
WBC: 9.1 10*3/uL (ref 3.4–10.8)

## 2023-04-10 LAB — LIPID PANEL
Chol/HDL Ratio: 6.2 {ratio} — ABNORMAL HIGH (ref 0.0–4.4)
Cholesterol, Total: 228 mg/dL — ABNORMAL HIGH (ref 100–199)
HDL: 37 mg/dL — ABNORMAL LOW (ref >39)
LDL Chol Calc (NIH): 128 mg/dL — ABNORMAL HIGH (ref 0–99)
Triglycerides: 352 mg/dL — ABNORMAL HIGH (ref 0–149)
VLDL Cholesterol Cal: 63 mg/dL — ABNORMAL HIGH (ref 5–40)

## 2023-04-10 LAB — MICROALBUMIN / CREATININE URINE RATIO
Creatinine, Urine: 142.9 mg/dL
Microalb/Creat Ratio: 183 mg/g{creat} — ABNORMAL HIGH (ref 0–29)
Microalbumin, Urine: 261.7 ug/mL

## 2023-04-10 LAB — BASIC METABOLIC PANEL
BUN/Creatinine Ratio: 26 (ref 12–28)
BUN: 22 mg/dL (ref 8–27)
CO2: 22 mmol/L (ref 20–29)
Calcium: 10 mg/dL (ref 8.7–10.3)
Chloride: 106 mmol/L (ref 96–106)
Creatinine, Ser: 0.85 mg/dL (ref 0.57–1.00)
Glucose: 111 mg/dL — ABNORMAL HIGH (ref 70–99)
Potassium: 4.3 mmol/L (ref 3.5–5.2)
Sodium: 144 mmol/L (ref 134–144)
eGFR: 71 mL/min/{1.73_m2} (ref >59)

## 2023-04-10 LAB — HEMOGLOBIN A1C
Est. average glucose Bld gHb Est-mCnc: 237 mg/dL
Hgb A1c MFr Bld: 9.9 % — ABNORMAL HIGH (ref 4.8–5.6)

## 2023-04-10 NOTE — Progress Notes (Signed)
Unable to reach patient, no vm. 

## 2023-05-10 DIAGNOSIS — H16202 Unspecified keratoconjunctivitis, left eye: Secondary | ICD-10-CM | POA: Diagnosis not present

## 2023-05-14 ENCOUNTER — Telehealth: Payer: Self-pay | Admitting: Internal Medicine

## 2023-05-14 NOTE — Telephone Encounter (Signed)
 Have not received lab request, but will not sign off , per patient she has not ordered anything from a lab

## 2023-05-14 NOTE — Telephone Encounter (Signed)
 Copied from CRM #[id number]. Topic: General - Other >> May 13, 2023  1:27 PM Alvino Blood C wrote: Reason for CRM: Tia Masker is calling to confirm if Lab work request has been recv'd, they would like a call back at [phone] to confirm

## 2023-05-15 ENCOUNTER — Other Ambulatory Visit: Payer: Self-pay | Admitting: Internal Medicine

## 2023-05-31 ENCOUNTER — Telehealth: Payer: Self-pay | Admitting: Internal Medicine

## 2023-05-31 NOTE — Telephone Encounter (Signed)
Patient wanting to speak with a nurse in regards to concerns of side effects for [place]. Please advise- wanting to speak with somebody before the weekend Thanks

## 2023-06-03 ENCOUNTER — Telehealth: Payer: Self-pay | Admitting: Nurse Practitioner

## 2023-06-03 NOTE — Telephone Encounter (Signed)
Mailbox full

## 2023-06-03 NOTE — Telephone Encounter (Signed)
[facility] called and stated that they had some [facility] that were [facility] this mutual Patient . Please advise.

## 2023-06-03 NOTE — Telephone Encounter (Signed)
Tiffany Ortiz, I am not in office today or Tues 06/04/2023 as I am APP at [facility]. Pls verify what forms this message is reporting?

## 2023-06-04 NOTE — Telephone Encounter (Addendum)
Fax that was received is from [facility]: Gastrointestinal Infection Test Requisition Form Document has been placed in your (in box) in your office.  Please advise

## 2023-06-04 NOTE — Telephone Encounter (Signed)
Tiffany Ortiz please advise if you have see forms that were faxed over.

## 2023-06-06 DIAGNOSIS — H04123 Dry eye syndrome of bilateral lacrimal glands: Secondary | ICD-10-CM | POA: Diagnosis not present

## 2023-06-06 NOTE — Telephone Encounter (Signed)
Unable to reach patient, VM not set up.

## 2023-06-06 NOTE — Telephone Encounter (Signed)
Glendora Score, I last saw patient in office 02/2023 with alternating constipation and diarrhea.  Please contact the patient and obtain a symptom update, verify if she is having diarrhea as I received the [facility] GI pathogen panel request form.  I cannot order this test if I have not recently seen the patient or  if I do not have a clear understanding of her current symptoms.  Please verify if she is having diarrhea, when did it start, frequency, any bloody diarrhea or abdominal pain if she been on any antibiotics or other new medications.  Thank you

## 2023-06-07 NOTE — Telephone Encounter (Signed)
Unable to reach patient, VM box not set up. 

## 2023-06-07 NOTE — Telephone Encounter (Signed)
Pt daughter Marton Redwood was made aware of the fax that we received.  Dionne stated that she was unaware that pt has diarrhea. Dianne stated that she would speak to the pt and call us back. Dionne  verbalized understanding with all questions answered.

## 2023-06-10 NOTE — Telephone Encounter (Signed)
NOT SIGNING ANYTHING, PER PATIENT SHE DID NOT ORDER ANYTHING

## 2023-06-10 NOTE — Telephone Encounter (Signed)
[facility] called again in regards to labs- agent thinks it may be a scam.

## 2023-06-11 NOTE — Telephone Encounter (Signed)
Pt daughter Marton Redwood  contacted for follow up and symptom update of pt. Dionne stated that she spoke with the pt and the pt stated that she is not having any issues and does not know anything about the that.  Routed as [employer]

## 2023-06-13 NOTE — Telephone Encounter (Signed)
 Received call from [facility] staff & patient, stating our office needed to sign off on a form for UTI labs. Advised them that they would need to contact PCP office. They asked that we return a fax once received stating this information. Awaiting fax

## 2023-06-13 NOTE — Telephone Encounter (Signed)
 Inbound call from [facility] regarding update on paperwork that was recently faxed. Advise paperwork would not be faxed back due to provider denying and patient unaware of request for labs and currently having no symptoms.

## 2023-06-14 ENCOUNTER — Ambulatory Visit: Payer: PPO | Admitting: Family Medicine

## 2023-06-14 ENCOUNTER — Institutional Professional Consult (permissible substitution): Payer: PPO | Admitting: Professional Counselor

## 2023-06-15 ENCOUNTER — Other Ambulatory Visit: Payer: Self-pay | Admitting: Internal Medicine

## 2023-06-19 ENCOUNTER — Encounter: Payer: Self-pay | Admitting: Internal Medicine

## 2023-06-19 ENCOUNTER — Ambulatory Visit (INDEPENDENT_AMBULATORY_CARE_PROVIDER_SITE_OTHER): Payer: PPO | Admitting: Internal Medicine

## 2023-06-19 VITALS — BP 152/60 | HR 68 | Ht 62.0 in | Wt 146.2 lb

## 2023-06-19 DIAGNOSIS — E1169 Type 2 diabetes mellitus with other specified complication: Secondary | ICD-10-CM

## 2023-06-19 DIAGNOSIS — R300 Dysuria: Secondary | ICD-10-CM

## 2023-06-19 DIAGNOSIS — Z7984 Long term (current) use of oral hypoglycemic drugs: Secondary | ICD-10-CM

## 2023-06-19 DIAGNOSIS — E119 Type 2 diabetes mellitus without complications: Secondary | ICD-10-CM

## 2023-06-19 DIAGNOSIS — I10 Essential (primary) hypertension: Secondary | ICD-10-CM

## 2023-06-19 DIAGNOSIS — B3731 Acute candidiasis of vulva and vagina: Secondary | ICD-10-CM

## 2023-06-19 MED ORDER — FLUCONAZOLE 150 MG PO TABS: 150.0000 mg | ORAL_TABLET | Freq: Once | ORAL | 0 refills | Status: AC

## 2023-06-19 MED ORDER — GLIPIZIDE ER 10 MG PO TB24: 10.0000 mg | ORAL_TABLET | Freq: Every day | ORAL | 2 refills | Status: DC

## 2023-06-19 NOTE — Patient Instructions (Signed)
It was a pleasure to see you today.  Thank you for giving [place] the opportunity to be involved in your care.  Below is a brief recap of your visit and next steps.  We will plan to see you again on March 3rd.  Summary Stop Marcelline Deist Diflucan prescribed for yeast infection Will check urine study to rule out UTI Follow up as scheduled on 3/3

## 2023-06-19 NOTE — Progress Notes (Signed)
 Acute Office Visit  Subjective:     Patient ID: Tiffany Ortiz, female    DOB: [birth year], 78 y.o.   MRN: [id number]  Chief Complaint  Patient presents with   Urinary Tract Infection    Uti sx of itching and burning .    Ms. Behrendt presents today for an acute visit endorsing a recent history of vaginal irritation and dysuria.  She states that symptoms began after starting Farxiga.  She has not tried applying any topical medication for treatment of vaginal irritation.  No discoloration or foul odor of urine.  Her blood pressure is elevated today and she states that she has not taken her medications.  Review of Systems  Genitourinary:  Positive for dysuria.       Vaginal irritation  All other systems reviewed and are negative.     Objective:    BP (!) 152/60 (BP Location: Left Arm)   Pulse 68   Ht 5\' 2"  (1.575 m)   Wt 146 lb 3.2 oz (66.3 kg)   SpO2 97%   BMI 26.74 kg/m   Physical Exam Vitals reviewed.  Constitutional:      General: She is not in acute distress.    Appearance: Normal appearance. She is not toxic-appearing.  HENT:     Head: Normocephalic and atraumatic.     Right Ear: External ear normal.     Left Ear: External ear normal.     Nose: Nose normal. No congestion or rhinorrhea.     Mouth/Throat:     Mouth: Mucous membranes are moist.     Pharynx: Oropharynx is clear. No oropharyngeal exudate or posterior oropharyngeal erythema.  Eyes:     General: No scleral icterus.    Extraocular Movements: Extraocular movements intact.     Conjunctiva/sclera: Conjunctivae normal.     Pupils: Pupils are equal, round, and reactive to light.  Cardiovascular:     Rate and Rhythm: Normal rate and regular rhythm.     Pulses: Normal pulses.     Heart sounds: Normal heart sounds. No murmur heard.    No friction rub. No gallop.  Pulmonary:     Effort: Pulmonary effort is normal.     Breath sounds: Normal breath sounds. No wheezing, rhonchi or rales.  Abdominal:      General: Abdomen is flat. Bowel sounds are normal. There is no distension.     Palpations: Abdomen is soft.     Tenderness: There is no abdominal tenderness.  Musculoskeletal:        General: No swelling. Normal range of motion.     Cervical back: Normal range of motion.     Right lower leg: No edema.     Left lower leg: No edema.  Lymphadenopathy:     Cervical: No cervical adenopathy.  Skin:    General: Skin is warm and dry.     Capillary Refill: Capillary refill takes less than 2 seconds.     Coloration: Skin is not jaundiced.  Neurological:     General: No focal deficit present.     Mental Status: She is alert and oriented to person, place, and time.     Gait: Gait abnormal (Ambulates with cane).  Psychiatric:        Mood and Affect: Mood normal.        Behavior: Behavior normal.       Assessment & Plan:   Problem List Items Addressed This Visit       Essential hypertension  BP is elevated today.  Previously well-controlled with amlodipine and lisinopril.  She did not take her medications prior to today's appointment.  No medication changes were made today.  Monitor BP at follow-up next month.      Type 2 diabetes mellitus without complications (HCC)   A1c 9.9 on labs from December 2024.  She is currently prescribed glipizide 10 mg daily.  Marcelline Deist recently added but will be discontinued as otherwise documented.  She will return to care for previously scheduled follow-up in early March.      Candida vaginitis   Presenting today for an acute visit endorsing recent history of vaginal irritation.  Symptoms began after starting Farxiga.  Suspect Candida vaginitis.  Will empirically treat with Diflucan and check UA due to reports of dysuria.  Recommend stopping [place].  She will return to care for previously scheduled follow-up in early March.      Meds ordered this encounter  Medications   glipiZIDE (GLUCOTROL XL) 10 MG 24 hr tablet    Sig: Take 1 tablet (10 mg total) by  mouth daily with breakfast.    Dispense:  30 tablet    Refill:  2   fluconazole (DIFLUCAN) 150 MG tablet    Sig: Take 1 tablet (150 mg total) by mouth once for 1 dose.    Dispense:  1 tablet    Refill:  0    Return if symptoms worsen or fail to improve.  Billie Lade, MD

## 2023-06-20 ENCOUNTER — Encounter: Payer: Self-pay | Admitting: Internal Medicine

## 2023-06-20 LAB — MICROSCOPIC EXAMINATION: Casts: NONE SEEN /[LPF]

## 2023-06-20 LAB — UA/M W/RFLX CULTURE, ROUTINE
Bilirubin, UA: NEGATIVE
Leukocytes,UA: NEGATIVE
Nitrite, UA: NEGATIVE
RBC, UA: NEGATIVE
Specific Gravity, UA: >=1.03 — AB (ref 1.005–1.030)
Urobilinogen, Ur: 1 mg/dL (ref 0.2–1.0)
pH, UA: 6.5 (ref 5.0–7.5)

## 2023-07-08 ENCOUNTER — Ambulatory Visit: Payer: PPO | Admitting: Internal Medicine

## 2023-07-08 ENCOUNTER — Ambulatory Visit: Payer: PPO | Admitting: Nurse Practitioner

## 2023-07-08 DIAGNOSIS — B3731 Acute candidiasis of vulva and vagina: Secondary | ICD-10-CM | POA: Insufficient documentation

## 2023-07-08 NOTE — Assessment & Plan Note (Signed)
 BP is elevated today.  Previously well-controlled with amlodipine and lisinopril.  She did not take her medications prior to today's appointment.  No medication changes were made today.  Monitor BP at follow-up next month.

## 2023-07-08 NOTE — Assessment & Plan Note (Signed)
 A1c 9.9 on labs from December 2024.  She is currently prescribed glipizide 10 mg daily.  Marcelline Deist recently added but will be discontinued as otherwise documented.  She will return to care for previously scheduled follow-up in early March.

## 2023-07-08 NOTE — Assessment & Plan Note (Addendum)
 Presenting today for an acute visit endorsing recent history of vaginal irritation.  Symptoms began after starting Farxiga.  Suspect Candida vaginitis.  Will empirically treat with Diflucan and check UA due to reports of dysuria.  Recommend stopping [place].  She will return to care for previously scheduled follow-up in early March.

## 2023-07-09 DIAGNOSIS — H04123 Dry eye syndrome of bilateral lacrimal glands: Secondary | ICD-10-CM | POA: Diagnosis not present

## 2023-07-13 ENCOUNTER — Other Ambulatory Visit: Payer: Self-pay | Admitting: Internal Medicine

## 2023-07-13 DIAGNOSIS — F419 Anxiety disorder, unspecified: Secondary | ICD-10-CM

## 2023-07-16 ENCOUNTER — Ambulatory Visit: Payer: PPO | Admitting: Family Medicine

## 2023-07-16 ENCOUNTER — Other Ambulatory Visit: Payer: Self-pay | Admitting: Internal Medicine

## 2023-07-16 DIAGNOSIS — F419 Anxiety disorder, unspecified: Secondary | ICD-10-CM

## 2023-07-18 ENCOUNTER — Other Ambulatory Visit: Payer: Self-pay

## 2023-07-18 ENCOUNTER — Telehealth: Payer: Self-pay | Admitting: Internal Medicine

## 2023-07-18 DIAGNOSIS — F419 Anxiety disorder, unspecified: Secondary | ICD-10-CM

## 2023-07-18 MED ORDER — SERTRALINE HCL 100 MG PO TABS
100.0000 mg | ORAL_TABLET | Freq: Every day | ORAL | 0 refills | Status: DC
Start: 2023-07-18 — End: 2023-09-07

## 2023-07-18 NOTE — Telephone Encounter (Signed)
 Refill sent to pharmacy.

## 2023-07-18 NOTE — Telephone Encounter (Signed)
 Prescription Request  07/18/2023  LOV: 06/19/2023  What is the name of the medication or equipment? clonazePAM (KLONOPIN) 0.5 MG tablet [[id number]]  sertraline (ZOLOFT) 100 MG tablet [[id number]]    Have you contacted your pharmacy to request a refill? Yes   Which pharmacy would you like this sent to?   [facility] - [place], [place] - [address] [place] [address] Phone: [phone] Fax: [phone]      Patient notified that their request is being sent to the clinical staff for review and that they should receive a response within 2 business days.   Please advise at [phone] [facility] [phone]

## 2023-07-19 ENCOUNTER — Telehealth: Payer: Self-pay

## 2023-07-19 ENCOUNTER — Ambulatory Visit: Admitting: Internal Medicine

## 2023-07-19 DIAGNOSIS — F419 Anxiety disorder, unspecified: Secondary | ICD-10-CM

## 2023-07-19 MED ORDER — CLONAZEPAM 0.5 MG PO TABS: 0.2500 mg | ORAL_TABLET | Freq: Two times a day (BID) | ORAL | 0 refills | Status: DC | PRN

## 2023-07-19 NOTE — Telephone Encounter (Signed)
 Clonazepam refill sent electronically to [facility].

## 2023-07-23 ENCOUNTER — Ambulatory Visit: Payer: PPO | Admitting: Family Medicine

## 2023-08-14 ENCOUNTER — Telehealth: Payer: Self-pay

## 2023-08-14 NOTE — Telephone Encounter (Signed)
 Patient was identified as falling into the [place] Measure - Diabetes.   Patient was: Appointment already scheduled for:  09/06/23.

## 2023-09-04 ENCOUNTER — Telehealth: Payer: Self-pay

## 2023-09-04 NOTE — Telephone Encounter (Signed)
 Copied from CRM [id number]. Topic: General - Other >> Sep 03, 2023  4:21 PM Phil Braun wrote: Reason for CRM:   Ref 5-7 fall of last year  [facility] Worker Phone [phone]  Jaqueline with [employer] called and stated that patient has 5-7 falls since the fall of last year and is need of a walker. She asks that we order one for her.   Please call and confirm with insurance of message.

## 2023-09-04 NOTE — Telephone Encounter (Signed)
 Patient already has walker

## 2023-09-06 ENCOUNTER — Encounter: Payer: Self-pay | Admitting: Internal Medicine

## 2023-09-06 ENCOUNTER — Ambulatory Visit: Admitting: Internal Medicine

## 2023-09-06 VITALS — BP 152/83 | HR 86 | Ht 61.0 in | Wt 147.6 lb

## 2023-09-06 DIAGNOSIS — I10 Essential (primary) hypertension: Secondary | ICD-10-CM

## 2023-09-06 DIAGNOSIS — E1159 Type 2 diabetes mellitus with other circulatory complications: Secondary | ICD-10-CM

## 2023-09-06 DIAGNOSIS — E119 Type 2 diabetes mellitus without complications: Secondary | ICD-10-CM | POA: Diagnosis not present

## 2023-09-06 DIAGNOSIS — E1169 Type 2 diabetes mellitus with other specified complication: Secondary | ICD-10-CM

## 2023-09-06 DIAGNOSIS — E785 Hyperlipidemia, unspecified: Secondary | ICD-10-CM | POA: Diagnosis not present

## 2023-09-06 MED ORDER — AMLODIPINE BESYLATE 5 MG PO TABS: 5.0000 mg | ORAL_TABLET | Freq: Every day | ORAL | 3 refills | Status: AC

## 2023-09-06 MED ORDER — LISINOPRIL 10 MG PO TABS
10.0000 mg | ORAL_TABLET | Freq: Every day | ORAL | 3 refills | Status: AC
Start: 2023-09-06 — End: ?

## 2023-09-06 NOTE — Progress Notes (Signed)
 Established Patient Office Visit  Subjective   Patient ID: Tiffany Ortiz, female    DOB: 1945/06/25  Age: 78 y.o. MRN: [id number]  Chief Complaint  Patient presents with   Care Management    Follow up    Ms. Sando returns today for routine follow-up.  She was last evaluated by me on 2/12 for an acute visit endorsing vaginal irritation.  Treated with Diflucan  for suspected Candida vaginitis in the setting of SGLT2i treatment.  Farxiga  was discontinued.  She has been seen by optometry in the interim.  There have otherwise been no acute interval events.  Today she reports feeling fairly well.  She continues to endorse intermittent constipation and diarrhea.  She is accompanied by her granddaughter and has no acute concerns to discuss.  Past Medical History:  Diagnosis Date   Allergy    Anxiety    Arthritis    Cancer (HCC)    carcinoid tumor stomach   Cataract    REMOVED BILATERAL   Depression    Diabetes mellitus    Fibromyalgia    GERD (gastroesophageal reflux disease)    Headache(784.0)    Hyperlipidemia    Hyperplastic colon polyp 02/04/2013   Hypertension    IBS (irritable bowel syndrome)    Osteoporosis    Polyp of rectum 02/04/2013   Renal disorder    Shortness of breath    Sleep apnea    Vitamin D deficiency    Past Surgical History:  Procedure Laterality Date   ABDOMINAL HYSTERECTOMY     Cervical cancer, with incidental appendectomy   APPENDECTOMY     BIOPSY N/A 02/04/2013   Procedure: BIOPSY;  Surgeon: Suzette Espy, MD;  Location: [facility];  Service: Endoscopy;  Laterality: N/A;  SB BX AND RANDOM COLON BX   bladder tack     BREAST LUMPECTOMY     benign   COLONOSCOPY  09/22/2003   RMR: Diminutive rectal polyps cold biopsy/removed.  Otherwise normal rectal mucosa/Pancolonic diverticula.  The remainder of the colonic mucosa appeared normal   COLONOSCOPY WITH ESOPHAGOGASTRODUODENOSCOPY (EGD) N/A 02/04/2013   Procedure: COLONOSCOPY WITH  ESOPHAGOGASTRODUODENOSCOPY (EGD);  Surgeon: Suzette Espy, MD;  Location: [facility];  Service: Endoscopy;  Laterality: N/A;  10:15-moved to 9:30am   ESOPHAGOGASTRODUODENOSCOPY  11/19/2006   WUJ:[id number] Schatzki's ring with overlying distal esophageal erosions consistent with erosive reflux esophagitis, status post dilation and disruption of ring as described above/ Small hiatal hernia/Otherwise normal stomach, first duodenum and second duodenum   EUS N/A 02/12/2013   Procedure: UPPER ENDOSCOPIC ULTRASOUND (EUS) LINEAR;  Surgeon: Janel Medford, MD;  Location: [facility];  Service: Endoscopy;  Laterality: N/A;   EUS N/A 08/20/2013   Procedure: UPPER ENDOSCOPIC ULTRASOUND (EUS) LINEAR;  Surgeon: Janel Medford, MD;  Location: [facility];  Service: Endoscopy;  Laterality: N/A;   LAPAROSCOPIC SMALL BOWEL RESECTION N/A 03/31/2013   Procedure: LAPAROSCOPIC  ASSISTED CONVERTED TO OPEN RESECTION DUODENAL MASS/UPPER ENDOSCOPY;  Surgeon: Lockie Rima, MD;  Location: [facility];  Service: General;  Laterality: N/A;   TONSILLECTOMY     Social History   Tobacco Use   Smoking status: Former    Types: Cigarettes   Smokeless tobacco: Former    Quit date: 02/11/1970  Vaping Use   Vaping status: Never Used  Substance Use Topics   Alcohol  use: No   Drug use: No   Family History  Problem Relation Age of Onset   Cancer Mother        ?  ovarian   Cancer Father    Colon cancer Neg Hx    Colon polyps Neg Hx    Esophageal cancer Neg Hx    Rectal cancer Neg Hx    Stomach cancer Neg Hx    Pancreatic cancer Neg Hx    Allergies  Allergen Reactions   Other     Metal alloy-breaks her out-esp. Metal surgical instruments or some metal implanted in me   Tyson Gals [Aspirin]     Kidney doctor told her to not take ASA   Erythromycin     Doesn't remember    Famciclovir     migrane   Hydrocodone     "makes me lose my mind"-hallucinations   Ibuprofen     Kidney doctor told her to not take ibuprofen.    Lactose Intolerance (Gi) Diarrhea and Other (See Comments)    Severe diarrhea   Morphine  And Codeine Other (See Comments)    Pt says Morphine  makes her go out of her head   Oxycodone     hallucinations   Promethazine Nausea And Vomiting   Tilactase Diarrhea   Tylenol [Acetaminophen]     Knocks her out.   Review of Systems  Constitutional:  Negative for chills and fever.  HENT:  Negative for sore throat.   Respiratory:  Negative for cough and shortness of breath.   Cardiovascular:  Negative for chest pain, palpitations and leg swelling.  Gastrointestinal:  Negative for abdominal pain, blood in stool, constipation, diarrhea, nausea and vomiting.       Alternating bowel habits  Genitourinary:  Negative for dysuria and hematuria.  Musculoskeletal:  Negative for myalgias.  Skin:  Negative for itching and rash.  Neurological:  Negative for dizziness and headaches.  Psychiatric/Behavioral:  Negative for depression and suicidal ideas.      Objective:     BP (!) 152/83   Pulse 86   Ht 5\' 1"  (1.549 m)   Wt 147 lb 9.6 oz (67 kg)   SpO2 95%   BMI 27.89 kg/m  BP Readings from Last 3 Encounters:  09/06/23 (!) 152/83  06/19/23 (!) 152/60  04/08/23 122/80   Physical Exam Vitals reviewed.  Constitutional:      General: She is not in acute distress.    Appearance: Normal appearance. She is not toxic-appearing.  HENT:     Head: Normocephalic and atraumatic.     Right Ear: External ear normal.     Left Ear: External ear normal.     Nose: Nose normal. No congestion or rhinorrhea.     Mouth/Throat:     Mouth: Mucous membranes are moist.     Pharynx: Oropharynx is clear. No oropharyngeal exudate or posterior oropharyngeal erythema.  Eyes:     General: No scleral icterus.    Extraocular Movements: Extraocular movements intact.     Conjunctiva/sclera: Conjunctivae normal.     Pupils: Pupils are equal, round, and reactive to light.  Cardiovascular:     Rate and Rhythm: Normal rate  and regular rhythm.     Pulses: Normal pulses.     Heart sounds: Normal heart sounds. No murmur heard.    No friction rub. No gallop.  Pulmonary:     Effort: Pulmonary effort is normal.     Breath sounds: Normal breath sounds. No wheezing, rhonchi or rales.  Abdominal:     General: Abdomen is flat. Bowel sounds are normal. There is no distension.     Palpations: Abdomen is soft.  Tenderness: There is no abdominal tenderness.  Musculoskeletal:        General: No swelling. Normal range of motion.     Cervical back: Normal range of motion.     Right lower leg: No edema.     Left lower leg: No edema.  Lymphadenopathy:     Cervical: No cervical adenopathy.  Skin:    General: Skin is warm and dry.     Capillary Refill: Capillary refill takes less than 2 seconds.     Coloration: Skin is not jaundiced.  Neurological:     General: No focal deficit present.     Mental Status: She is alert and oriented to person, place, and time.     Gait: Gait abnormal (Ambulates with cane).  Psychiatric:        Mood and Affect: Mood normal.        Behavior: Behavior normal.   Last CBC Lab Results  Component Value Date   WBC 9.1 04/08/2023   HGB 14.4 04/08/2023   HCT 42.3 04/08/2023   MCV 88 04/08/2023   MCH 30.1 04/08/2023   RDW 12.4 04/08/2023   PLT 359 04/08/2023   Last metabolic panel Lab Results  Component Value Date   GLUCOSE 111 (H) 04/08/2023   NA 144 04/08/2023   K 4.3 04/08/2023   CL 106 04/08/2023   CO2 22 04/08/2023   BUN 22 04/08/2023   CREATININE 0.85 04/08/2023   EGFR 71 04/08/2023   CALCIUM 10.0 04/08/2023   PROT 7.1 02/25/2023   ALBUMIN 3.9 02/25/2023   BILITOT 0.3 02/25/2023   ALKPHOS 84 02/25/2023   AST 22 02/25/2023   ALT 27 02/25/2023   Last lipids Lab Results  Component Value Date   CHOL 228 (H) 04/08/2023   HDL 37 (L) 04/08/2023   LDLCALC 128 (H) 04/08/2023   TRIG 352 (H) 04/08/2023   CHOLHDL 6.2 (H) 04/08/2023   Last hemoglobin A1c Lab Results   Component Value Date   HGBA1C 8.6 (H) 09/06/2023   Last thyroid  functions Lab Results  Component Value Date   TSH 0.69 02/25/2023   The 10-year ASCVD risk score (Arnett DK, et al., 2019) is: 40.1%    Assessment & Plan:   Problem List Items Addressed This Visit       Essential hypertension   BP is mildly elevated today.  Previously well-controlled with amlodipine  and lisinopril .  Unclear if she took medications prior to today's appointment.  Concern for medication adherence.  Will refill amlodipine  and lisinopril  today.  Family will assist with medication reconciliation.  Follow-up in 4 weeks for reassessment.      Type 2 diabetes mellitus without complications (HCC) - Primary   A1c 9.9 on labs from December 2024.  She is currently prescribed glipizide  10 mg daily.  Farxiga  previously discontinued in the setting of yeast infection.  Will check POC A1c today, family to assist with medication reconciliation.  Follow-up in 4 weeks for reassessment.      Hyperlipidemia associated with type 2 diabetes mellitus (HCC)   Lipid panel last updated in December 2024.  Total cholesterol 228 and LDL 128.  Pravastatin  was increased to 40 mg daily in light of this result.  Question compliance based on medication dispense report.  Family to assist with medication reconciliation.  Follow-up in 4 weeks for reassessment.      Return in about 4 weeks (around 10/04/2023).   Tobi Fortes, MD

## 2023-09-06 NOTE — Patient Instructions (Signed)
 It was a pleasure to see you today.  Thank you for giving us  the opportunity to be involved in your care.  Below is a brief recap of your visit and next steps.  We will plan to see you again in 4 weeks.   Summary Amlodipine  and lisinopril  refilled Check A1c Reconcile home medications and please let us  know which medications need to be refilled Follow up in 4 weeks

## 2023-09-07 ENCOUNTER — Other Ambulatory Visit: Payer: Self-pay | Admitting: Internal Medicine

## 2023-09-07 DIAGNOSIS — F419 Anxiety disorder, unspecified: Secondary | ICD-10-CM

## 2023-09-09 LAB — BAYER DCA HB A1C WAIVED: HB A1C (BAYER DCA - WAIVED): 8.6 % — ABNORMAL HIGH (ref 4.8–5.6)

## 2023-09-09 MED ORDER — SERTRALINE HCL 100 MG PO TABS
100.0000 mg | ORAL_TABLET | Freq: Every day | ORAL | 0 refills | Status: DC
Start: 2023-09-09 — End: 2023-11-04

## 2023-09-18 ENCOUNTER — Other Ambulatory Visit: Payer: Self-pay | Admitting: Internal Medicine

## 2023-09-18 ENCOUNTER — Encounter: Payer: Self-pay | Admitting: Internal Medicine

## 2023-09-18 DIAGNOSIS — F419 Anxiety disorder, unspecified: Secondary | ICD-10-CM

## 2023-09-18 MED ORDER — CLONAZEPAM 0.5 MG PO TABS: 0.2500 mg | ORAL_TABLET | Freq: Two times a day (BID) | ORAL | 0 refills | Status: DC | PRN

## 2023-09-27 NOTE — Assessment & Plan Note (Signed)
 BP is mildly elevated today.  Previously well-controlled with amlodipine  and lisinopril .  Unclear if she took medications prior to today's appointment.  Concern for medication adherence.  Will refill amlodipine  and lisinopril  today.  Family will assist with medication reconciliation.  Follow-up in 4 weeks for reassessment.

## 2023-09-27 NOTE — Assessment & Plan Note (Addendum)
 Lipid panel last updated in December 2024.  Total cholesterol 228 and LDL 128.  Pravastatin  was increased to 40 mg daily in light of this result.  Question compliance based on medication dispense report.  Family to assist with medication reconciliation.  Follow-up in 4 weeks for reassessment.

## 2023-09-27 NOTE — Assessment & Plan Note (Signed)
 A1c 9.9 on labs from December 2024.  She is currently prescribed glipizide  10 mg daily.  Farxiga  previously discontinued in the setting of yeast infection.  Will check POC A1c today, family to assist with medication reconciliation.  Follow-up in 4 weeks for reassessment.

## 2023-10-04 ENCOUNTER — Telehealth: Payer: Self-pay

## 2023-10-04 NOTE — Telephone Encounter (Signed)
 Copied from CRM [id number]. Topic: Clinical - Home Health Verbal Orders >> Oct 04, 2023  3:02 PM Hamp Levine R wrote: Caller/Agency: [facility] Callback Number: [phone] Service Requested: Physical Therapy Frequency: 1-2 sessions for 4 weeks Any new concerns about the patient? No

## 2023-10-04 NOTE — Telephone Encounter (Signed)
 Left vm providing verbal orders

## 2023-10-07 ENCOUNTER — Telehealth: Payer: Self-pay | Admitting: Internal Medicine

## 2023-10-07 NOTE — Telephone Encounter (Deleted)
 Copied from CRM [id number]. Topic: Appointments - Scheduling Inquiry for Clinic >> Oct 07, 2023  2:31 PM Donald Frost wrote: Reason for CRM: Tiffany Ortiz with [facility] called in to make sure a Lab Requisition Form was received. After speaking with Avanell Bob at the office I told Tiffany Ortiz the patient needs an appt. She is already scheduled for June 12th and is on the wait list. Please schedule the patient sooner if she can be worked in since she is positive for a Uti. Tiffany Ortiz just wanted to alert the provider she is positive for a Uti. If any further questions please call Tiffany Ortiz at [phone]

## 2023-10-09 ENCOUNTER — Telehealth: Payer: Self-pay

## 2023-10-09 NOTE — Telephone Encounter (Signed)
 Copied from CRM [id number]. Topic: General - Other >> Oct 08, 2023  3:32 PM Shardie S wrote: Reason for CRM: [employer] provider, request that we reach out to a home health agency to setup physical therapy for patient and DME company for wheelchair order for patient. She ask that we contact patient to notify of services.  Callback # [phone]

## 2023-10-11 NOTE — Telephone Encounter (Signed)
 Tiffany Ortiz

## 2023-10-11 NOTE — Telephone Encounter (Signed)
 Pt coming in for eval next week and will verify with her at that time if she requested DME and PT services

## 2023-10-17 ENCOUNTER — Ambulatory Visit: Admitting: Internal Medicine

## 2023-11-01 ENCOUNTER — Other Ambulatory Visit: Payer: Self-pay | Admitting: Internal Medicine

## 2023-11-01 DIAGNOSIS — F419 Anxiety disorder, unspecified: Secondary | ICD-10-CM

## 2023-11-14 ENCOUNTER — Encounter: Payer: Self-pay | Admitting: Internal Medicine

## 2023-11-14 ENCOUNTER — Ambulatory Visit (INDEPENDENT_AMBULATORY_CARE_PROVIDER_SITE_OTHER): Admitting: Internal Medicine

## 2023-11-14 VITALS — BP 132/82 | HR 75 | Ht 61.0 in | Wt 148.0 lb

## 2023-11-14 DIAGNOSIS — Z7984 Long term (current) use of oral hypoglycemic drugs: Secondary | ICD-10-CM | POA: Diagnosis not present

## 2023-11-14 DIAGNOSIS — F419 Anxiety disorder, unspecified: Secondary | ICD-10-CM

## 2023-11-14 DIAGNOSIS — E1169 Type 2 diabetes mellitus with other specified complication: Secondary | ICD-10-CM | POA: Diagnosis not present

## 2023-11-14 DIAGNOSIS — E1140 Type 2 diabetes mellitus with diabetic neuropathy, unspecified: Secondary | ICD-10-CM

## 2023-11-14 DIAGNOSIS — E785 Hyperlipidemia, unspecified: Secondary | ICD-10-CM | POA: Diagnosis not present

## 2023-11-14 DIAGNOSIS — K5904 Chronic idiopathic constipation: Secondary | ICD-10-CM | POA: Diagnosis not present

## 2023-11-14 DIAGNOSIS — I10 Essential (primary) hypertension: Secondary | ICD-10-CM

## 2023-11-14 DIAGNOSIS — E0789 Other specified disorders of thyroid: Secondary | ICD-10-CM

## 2023-11-14 DIAGNOSIS — R269 Unspecified abnormalities of gait and mobility: Secondary | ICD-10-CM

## 2023-11-14 MED ORDER — PREGABALIN 25 MG PO CAPS: 25.0000 mg | ORAL_CAPSULE | Freq: Two times a day (BID) | ORAL | 0 refills | Status: DC

## 2023-11-14 MED ORDER — SERTRALINE HCL 100 MG PO TABS: 100.0000 mg | ORAL_TABLET | Freq: Every day | ORAL | 1 refills | Status: AC

## 2023-11-14 NOTE — Assessment & Plan Note (Signed)
 BP Readings from Last 1 Encounters:  11/14/23 132/82   Usually Well-controlled with amlodipine  5 mg QD, lisinopril  10 mg QD and chlorthalidone 25 mg QD Needs to get lisinopril  from pharmacy Counseled for compliance with the medications Advised DASH diet and ambulatory as tolerated

## 2023-11-14 NOTE — Progress Notes (Signed)
 Established Patient Office Visit  Subjective:  Patient ID: Tiffany Ortiz, female    DOB: [birth year]  Age: 78 y.o. MRN: [id number]  CC:  Chief Complaint  Patient presents with   Medical Management of Chronic Issues    4 week f/u , would like a referral to foot specialist due to bunions.    Fall    Reports two falls this week. Has back pain.     HPI Tiffany Ortiz is a 78 y.o. female with past medical history of HTN, type II DM, HLD, OSA, GERD, MDD and GAD who presents for f/u of her chronic medical conditions. She used to see Dr. Melvenia.  Her daughter is present during the visit today.  HTN: Her BP was significantly elevated today.  She takes amlodipine  5 mg QD, lisinopril  10 mg QD and chlorthalidone 25 mg QD.  She has run out of lisinopril .  Denies chest pain, dyspnea or palpitations currently.  Type II DM: Her last HbA1c was 8.6 in 05/25.  She takes glipizide  10 mg QD. She has noticed hyperglycemia, around 180s at home.  She does not check blood glucose regularly.  She also has history of diabetic neuropathy, and requests podiatry referral.  She has tried gabapentin and Cymbalta in the past, but did not tolerate them.  Recurrent falls: She reports recurrent falls, but denies any prodromal symptoms.  She has chronic leg weakness, likely due to diabetic neuropathy and has gait disturbance.  She had 2 falls in the last week, one was related to breaking of the armrest of the seat and the other was related to getting startled by a spider.  She has a history of chronic constipation, takes Linzess  every other day currently.  She takes fibermucil as well.  Denies melena or hematochezia currently.  GAD: She takes Zoloft  100 mg QD.  She also has clonazepam , takes 0.25 mg twice daily as needed for severe anxiety and insomnia.  She still has difficulty maintaining sleep at nighttime, but reports daytime naps while watching TV.    Past Medical History:  Diagnosis Date   Allergy     Anxiety    Arthritis    Cancer (HCC)    carcinoid tumor stomach   Cataract    REMOVED BILATERAL   Depression    Diabetes mellitus    Fibromyalgia    GERD (gastroesophageal reflux disease)    Headache(784.0)    Hyperlipidemia    Hyperplastic colon polyp 02/04/2013   Hypertension    IBS (irritable bowel syndrome)    Osteoporosis    Polyp of rectum 02/04/2013   Renal disorder    Shortness of breath    Sleep apnea    Vitamin D deficiency     Past Surgical History:  Procedure Laterality Date   ABDOMINAL HYSTERECTOMY     Cervical cancer, with incidental appendectomy   APPENDECTOMY     BIOPSY N/A 02/04/2013   Procedure: BIOPSY;  Surgeon: Lamar CHRISTELLA Hollingshead, MD;  Location: [facility];  Service: Endoscopy;  Laterality: N/A;  SB BX AND RANDOM COLON BX   bladder tack     BREAST LUMPECTOMY     benign   COLONOSCOPY  09/22/2003   RMR: Diminutive rectal polyps cold biopsy/removed.  Otherwise normal rectal mucosa/Pancolonic diverticula.  The remainder of the colonic mucosa appeared normal   COLONOSCOPY WITH ESOPHAGOGASTRODUODENOSCOPY (EGD) N/A 02/04/2013   Procedure: COLONOSCOPY WITH ESOPHAGOGASTRODUODENOSCOPY (EGD);  Surgeon: Lamar CHRISTELLA Hollingshead, MD;  Location: [facility];  Service: Endoscopy;  Laterality: N/A;  10:15-moved to 9:30am   ESOPHAGOGASTRODUODENOSCOPY  11/19/2006   MFM:Emnfpwzwu Schatzki's ring with overlying distal esophageal erosions consistent with erosive reflux esophagitis, status post dilation and disruption of ring as described above/ Small hiatal hernia/Otherwise normal stomach, first duodenum and second duodenum   EUS N/A 02/12/2013   Procedure: UPPER ENDOSCOPIC ULTRASOUND (EUS) LINEAR;  Surgeon: Toribio SHAUNNA Cedar, MD;  Location: [facility];  Service: Endoscopy;  Laterality: N/A;   EUS N/A 08/20/2013   Procedure: UPPER ENDOSCOPIC ULTRASOUND (EUS) LINEAR;  Surgeon: Toribio SHAUNNA Cedar, MD;  Location: [facility];  Service: Endoscopy;  Laterality: N/A;   LAPAROSCOPIC SMALL BOWEL  RESECTION N/A 03/31/2013   Procedure: LAPAROSCOPIC  ASSISTED CONVERTED TO OPEN RESECTION DUODENAL MASS/UPPER ENDOSCOPY;  Surgeon: Jina Nephew, MD;  Location: [facility];  Service: General;  Laterality: N/A;   TONSILLECTOMY      Family History  Problem Relation Age of Onset   Cancer Mother        ?ovarian   Cancer Father    Colon cancer Neg Hx    Colon polyps Neg Hx    Esophageal cancer Neg Hx    Rectal cancer Neg Hx    Stomach cancer Neg Hx    Pancreatic cancer Neg Hx     Social History   Socioeconomic History   Marital status: Married    Spouse name: Not on file   Number of children: 2   Years of education: Not on file   Highest education level: Not on file  Occupational History   Occupation: retired  Tobacco Use   Smoking status: Former    Types: Cigarettes   Smokeless tobacco: Former    Quit date: 02/11/1970  Vaping Use   Vaping status: Never Used  Substance and Sexual Activity   Alcohol  use: No   Drug use: No   Sexual activity: Not on file  Other Topics Concern   Not on file  Social History Narrative   Married, to [employer]   #2 grown children   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   Social Drivers of Health   Financial Resource Strain: High Risk (02/20/2023)   Overall Financial Resource Strain (CARDIA)    Difficulty of Paying Living Expenses: Hard  Food Insecurity: No Food Insecurity (02/20/2023)   Hunger Vital Sign    Worried About Running Out of Food in the Last Year: Never true    Ran Out of Food in the Last Year: Never true  Transportation Needs: No Transportation Needs (02/20/2023)   PRAPARE - Administrator, Civil Service (Medical): No    Lack of Transportation (Non-Medical): No  Physical Activity: Sufficiently Active (02/20/2023)   Exercise Vital Sign    Days of Exercise per Week: 7 days    Minutes of Exercise per Session: 30 min  Stress: No Stress Concern Present (02/20/2023)   [employer]  of Occupational Health - Occupational Stress Questionnaire    Feeling of Stress : Not at all  Social Connections: Moderately Integrated (02/20/2023)   Social Connection and Isolation Panel    Frequency of Communication with Friends and Family: More than three times a week    Frequency of Social Gatherings with Friends and Family: More than three times a week    Attends Religious Services: More than 4 times per year    Active Member of [employer] or Organizations: No    Attends Banker Meetings: Never    Marital Status:  Married  Intimate Partner Violence: Not At Risk (02/20/2023)   Humiliation, Afraid, Rape, and Kick questionnaire    Fear of Current or Ex-Partner: No    Emotionally Abused: No    Physically Abused: No    Sexually Abused: No    Outpatient Medications Prior to Visit  Medication Sig Dispense Refill   amLODipine  (NORVASC ) 5 MG tablet Take 1 tablet (5 mg total) by mouth daily. 90 tablet 3   b complex vitamins capsule Take 1 capsule by mouth daily.     chlorthalidone (HYGROTON) 25 MG tablet TAKE ONE TABLET BY MOUTH ONCE DAILY. 90 tablet 3   Cholecalciferol 125 MCG (5000 UT) TABS Take 1 capsule by mouth daily.     clonazePAM  (KLONOPIN ) 0.5 MG tablet Take 0.5 tablets (0.25 mg total) by mouth 2 (two) times daily as needed for anxiety. 30 tablet 0   Coenzyme Q10 (CO Q-10 PO) Take 1 tablet by mouth daily.     fexofenadine (ALLEGRA) 30 MG tablet Take 30 mg by mouth daily.      glipiZIDE  (GLUCOTROL  XL) 10 MG 24 hr tablet Take 1 tablet (10 mg total) by mouth daily with breakfast. 30 tablet 2   hyoscyamine  (LEVBID ) 0.375 MG 12 hr tablet TAKE 2 TABLETS BY MOUTH AS NEEDED DAILY. 60 tablet 3   LINZESS  72 MCG capsule TAKE ONE CAPSULE BY MOUTH ONCE DAILY. TAKE 30 MINUTES BEFORE BREAKFAST (Patient taking differently: daily as needed.) 30 capsule 2   lipase/protease/amylase (CREON ) 12000-38000 units CPEP capsule Take 2 capsules daily with each meal 540 capsule 0   lisinopril  (ZESTRIL )  10 MG tablet Take 1 tablet (10 mg total) by mouth daily. 90 tablet 3   Multiple Vitamin (MULTIVITAMIN) tablet Take 1 tablet by mouth daily. With Vit D     Omega-3 Fatty Acids (OMEGA 3 PO) Take 1 capsule by mouth daily. With Vit D     omeprazole  (PRILOSEC) 40 MG capsule Take 40 mg by mouth daily.     oxymetazoline  (AFRIN) 0.05 % nasal spray Place 1 spray into both nostrils 2 (two) times daily as needed for congestion.     potassium chloride  SA (KLOR-CON  M) 20 MEQ tablet Take 1 tablet (20 mEq total) by mouth 2 (two) times daily for 7 days. 14 tablet 0   pravastatin  (PRAVACHOL ) 40 MG tablet Take 1 tablet (40 mg total) by mouth daily. 90 tablet 3   Probiotic Product (PROBIOTIC PO) Take 1 tablet by mouth daily.     Propylene Glycol-Glycerin (SOOTHE OP) Place 1 drop into both eyes 2 (two) times daily as needed.      RESTASIS 0.05 % ophthalmic emulsion Place into both eyes 2 (two) times daily.      UNABLE TO FIND Take 1 capsule by mouth daily. Med Name: Phytoceramides     UNABLE TO FIND Take 1 capsule by mouth daily. Med Name: CinnaChroma     UNABLE TO FIND Take 2 capsules by mouth daily. Med Name: PC Liver and Brain     valACYclovir  (VALTREX ) 500 MG tablet Take 1 tablet (500 mg total) by mouth as needed. Reported on 10/31/2015 90 tablet 1   sertraline  (ZOLOFT ) 100 MG tablet Take 1 tablet (100 mg total) by mouth daily. 30 tablet 0   No facility-administered medications prior to visit.    Allergies  Allergen Reactions   Other     Metal alloy-breaks her out-esp. Metal surgical instruments or some metal implanted in me   Asa [Aspirin]  Kidney doctor told her to not take ASA   Erythromycin     Doesn't remember    Famciclovir     migrane   Hydrocodone     makes me lose my mind-hallucinations   Ibuprofen     Kidney doctor told her to not take ibuprofen.   Lactose Intolerance (Gi) Diarrhea and Other (See Comments)    Severe diarrhea   Morphine  And Codeine Other (See Comments)    Pt says  Morphine  makes her go out of her head   Oxycodone     hallucinations   Promethazine Nausea And Vomiting   Tilactase Diarrhea   Tylenol [Acetaminophen]     Knocks her out.    ROS Review of Systems  Constitutional:  Positive for fatigue. Negative for chills and fever.  HENT:  Negative for congestion and sore throat.   Eyes:  Negative for pain and discharge.  Respiratory:  Negative for cough and shortness of breath.   Cardiovascular:  Negative for chest pain and palpitations.  Gastrointestinal:  Positive for constipation. Negative for abdominal pain, diarrhea, nausea and vomiting.  Endocrine: Negative for polydipsia and polyuria.  Genitourinary:  Negative for dysuria and hematuria.  Musculoskeletal:  Positive for arthralgias, back pain, gait problem and neck pain. Negative for neck stiffness.  Skin:  Negative for rash.  Neurological:  Positive for weakness (Bilateral legs). Negative for dizziness.  Psychiatric/Behavioral:  Positive for sleep disturbance. Negative for agitation and behavioral problems. The patient is nervous/anxious.       Objective:    Physical Exam Vitals reviewed.  Constitutional:      General: She is not in acute distress.    Appearance: She is not diaphoretic.     Comments: Has a cane  HENT:     Head: Normocephalic and atraumatic.     Nose: Nose normal. No congestion.     Mouth/Throat:     Mouth: Mucous membranes are moist.     Pharynx: No posterior oropharyngeal erythema.  Eyes:     General: No scleral icterus.    Extraocular Movements: Extraocular movements intact.  Cardiovascular:     Rate and Rhythm: Normal rate and regular rhythm.     Heart sounds: Normal heart sounds. No murmur heard. Pulmonary:     Breath sounds: Normal breath sounds. No wheezing or rales.  Musculoskeletal:     Cervical back: Neck supple. No tenderness.     Lumbar back: Tenderness present. Decreased range of motion.     Right lower leg: No edema.     Left lower leg: No  edema.  Feet:     Comments: Hammertoes bilaterally Skin:    General: Skin is warm.     Findings: No rash.  Neurological:     General: No focal deficit present.     Mental Status: She is alert and oriented to person, place, and time.     Sensory: Sensory deficit (Bilateral feet) present.     Motor: Weakness (Bilateral LE - 3/5) present.     Gait: Gait abnormal.  Psychiatric:        Mood and Affect: Mood normal.        Behavior: Behavior normal.     BP 132/82 (BP Location: Left Arm)   Pulse 75   Ht 5' 1 (1.549 m)   Wt 148 lb (67.1 kg)   SpO2 97%   BMI 27.96 kg/m  Wt Readings from Last 3 Encounters:  11/14/23 148 lb (67.1 kg)  09/06/23 147 lb 9.6  oz (67 kg)  06/19/23 146 lb 3.2 oz (66.3 kg)    Lab Results  Component Value Date   TSH 0.69 02/25/2023   Lab Results  Component Value Date   WBC 9.1 04/08/2023   HGB 14.4 04/08/2023   HCT 42.3 04/08/2023   MCV 88 04/08/2023   PLT 359 04/08/2023   Lab Results  Component Value Date   NA 144 04/08/2023   K 4.3 04/08/2023   CO2 22 04/08/2023   GLUCOSE 111 (H) 04/08/2023   BUN 22 04/08/2023   CREATININE 0.85 04/08/2023   BILITOT 0.3 02/25/2023   ALKPHOS 84 02/25/2023   AST 22 02/25/2023   ALT 27 02/25/2023   PROT 7.1 02/25/2023   ALBUMIN 3.9 02/25/2023   CALCIUM 10.0 04/08/2023   EGFR 71 04/08/2023   GFR 57.20 (L) 02/25/2023   Lab Results  Component Value Date   CHOL 228 (H) 04/08/2023   Lab Results  Component Value Date   HDL 37 (L) 04/08/2023   Lab Results  Component Value Date   LDLCALC 128 (H) 04/08/2023   Lab Results  Component Value Date   TRIG 352 (H) 04/08/2023   Lab Results  Component Value Date   CHOLHDL 6.2 (H) 04/08/2023   Lab Results  Component Value Date   HGBA1C 8.6 (H) 09/06/2023      Assessment & Plan:   Problem List Items Addressed This Visit       Cardiovascular and Mediastinum   Essential hypertension   BP Readings from Last 1 Encounters:  11/14/23 132/82   Usually  Well-controlled with amlodipine  5 mg QD, lisinopril  10 mg QD and chlorthalidone 25 mg QD Needs to get lisinopril  from pharmacy Counseled for compliance with the medications Advised DASH diet and ambulatory as tolerated      Relevant Orders   CMP14+EGFR   CBC with Differential/Platelet     Digestive   Chronic constipation   Chronic constipation, followed by GI She takes Linzess  every other day for now Also takes MiraLAX and fibermucil        Endocrine   Type 2 diabetes mellitus with other specified complication (HCC) - Primary   Lab Results  Component Value Date   HGBA1C 8.6 (H) 09/06/2023   Uncontrolled due to diet/medication noncompliance in the past On glipizide  10 mg QD for now Advised to follow diabetic diet On statin and ACEi F/u CMP and lipid panel Diabetic foot exam: Today Diabetic eye exam: Advised to follow up with Ophthalmology for diabetic eye exam      Relevant Orders   CMP14+EGFR   CBC with Differential/Platelet   Hemoglobin A1c   Hyperlipidemia associated with type 2 diabetes mellitus (HCC)   Continue pravastatin  40 mg QD      Type 2 diabetes mellitus with diabetic neuropathy, without long-term current use of insulin  (HCC)   Has diabetic neuropathy and hammertoes, likely leading to gait disturbance and leg weakness Will refer to podiatry Has tried gabapentin and Cymbalta in the past Started Lyrica  25 mg BID      Relevant Medications   pregabalin  (LYRICA ) 25 MG capsule   Other Relevant Orders   Ambulatory referral to Podiatry   Ambulatory referral to Home Health     Other   Anxiety   Overall well-controlled with Zoloft  100 mg QD Takes clonazepam  0.25 mg twice daily as needed - PDMP reviewed, refilled      Relevant Medications   sertraline  (ZOLOFT ) 100 MG tablet   Gait disturbance  Likely due to diabetic neuropathy and OA of knee/hips Also likely has DDD of lumbar spine Advised to use cane as walking support in the home to prevent  falls Referred to PT for home health Check CBC, CMP, TSH      Relevant Orders   Ambulatory referral to Home Health   Other Visit Diagnoses       Other specified disorders of thyroid        Relevant Orders   TSH       Meds ordered this encounter  Medications   pregabalin  (LYRICA ) 25 MG capsule    Sig: Take 1 capsule (25 mg total) by mouth 2 (two) times daily.    Dispense:  60 capsule    Refill:  0   sertraline  (ZOLOFT ) 100 MG tablet    Sig: Take 1 tablet (100 mg total) by mouth daily.    Dispense:  90 tablet    Refill:  1    Follow-up: Return in about 4 weeks (around 12/12/2023) for HTN and DM.    Tiffany MARLA Blanch, MD

## 2023-11-14 NOTE — Assessment & Plan Note (Signed)
 Chronic constipation, followed by GI She takes Linzess  every other day for now Also takes MiraLAX and fibermucil

## 2023-11-14 NOTE — Assessment & Plan Note (Signed)
 Has diabetic neuropathy and hammertoes, likely leading to gait disturbance and leg weakness Will refer to podiatry Has tried gabapentin and Cymbalta in the past Started Lyrica  25 mg BID

## 2023-11-14 NOTE — Patient Instructions (Addendum)
 Please continue taking Amlodipine , Lisinopril  and Chlorthalidone.  Please start taking Lyrica  for neuropathy.  Please continue to take other medications as prescribed.  Please continue to follow low carb diet and ambulate as tolerated.

## 2023-11-14 NOTE — Assessment & Plan Note (Signed)
-  Continue pravastatin 40mg  QD

## 2023-11-14 NOTE — Assessment & Plan Note (Addendum)
 Likely due to diabetic neuropathy and OA of knee/hips Also likely has DDD of lumbar spine Advised to use cane as walking support in the home to prevent falls Referred to PT for home health Check CBC, CMP, TSH

## 2023-11-14 NOTE — Assessment & Plan Note (Signed)
 Overall well-controlled with Zoloft  100 mg QD Takes clonazepam  0.25 mg twice daily as needed - PDMP reviewed, refilled

## 2023-11-14 NOTE — Assessment & Plan Note (Signed)
 Lab Results  Component Value Date   HGBA1C 8.6 (H) 09/06/2023   Uncontrolled due to diet/medication noncompliance in the past On glipizide  10 mg QD for now Advised to follow diabetic diet On statin and ACEi F/u CMP and lipid panel Diabetic foot exam: Today Diabetic eye exam: Advised to follow up with Ophthalmology for diabetic eye exam

## 2023-11-21 ENCOUNTER — Other Ambulatory Visit: Payer: Self-pay | Admitting: Internal Medicine

## 2023-11-21 DIAGNOSIS — E119 Type 2 diabetes mellitus without complications: Secondary | ICD-10-CM

## 2023-11-26 DIAGNOSIS — G4733 Obstructive sleep apnea (adult) (pediatric): Secondary | ICD-10-CM | POA: Diagnosis not present

## 2023-11-26 DIAGNOSIS — E1169 Type 2 diabetes mellitus with other specified complication: Secondary | ICD-10-CM | POA: Diagnosis not present

## 2023-11-26 DIAGNOSIS — K5900 Constipation, unspecified: Secondary | ICD-10-CM | POA: Diagnosis not present

## 2023-11-26 DIAGNOSIS — E663 Overweight: Secondary | ICD-10-CM | POA: Diagnosis not present

## 2023-11-26 DIAGNOSIS — I10 Essential (primary) hypertension: Secondary | ICD-10-CM | POA: Diagnosis not present

## 2023-11-26 DIAGNOSIS — K869 Disease of pancreas, unspecified: Secondary | ICD-10-CM | POA: Diagnosis not present

## 2023-11-26 DIAGNOSIS — E785 Hyperlipidemia, unspecified: Secondary | ICD-10-CM | POA: Diagnosis not present

## 2023-11-26 DIAGNOSIS — R32 Unspecified urinary incontinence: Secondary | ICD-10-CM | POA: Diagnosis not present

## 2023-11-26 DIAGNOSIS — G8929 Other chronic pain: Secondary | ICD-10-CM | POA: Diagnosis not present

## 2023-11-26 DIAGNOSIS — F411 Generalized anxiety disorder: Secondary | ICD-10-CM | POA: Diagnosis not present

## 2023-11-26 DIAGNOSIS — K219 Gastro-esophageal reflux disease without esophagitis: Secondary | ICD-10-CM | POA: Diagnosis not present

## 2023-11-26 DIAGNOSIS — F3342 Major depressive disorder, recurrent, in full remission: Secondary | ICD-10-CM | POA: Diagnosis not present

## 2023-11-29 ENCOUNTER — Telehealth: Payer: Self-pay

## 2023-11-29 NOTE — Telephone Encounter (Signed)
 Copied from CRM [id number]. Topic: General - Other >> Nov 29, 2023  3:45 PM Fonda T wrote: Reason for CRM: Received call from Jasmine from [facility], call back number, [phone].  Calling to inform, there will be a delay in start of home health care as per patient requested.  Start of care will be Monday, 12/02/23.  Jasmine can be reached at above number if need to discuss further.

## 2023-12-02 ENCOUNTER — Telehealth: Payer: Self-pay

## 2023-12-02 DIAGNOSIS — Z9181 History of falling: Secondary | ICD-10-CM | POA: Diagnosis not present

## 2023-12-02 DIAGNOSIS — G8929 Other chronic pain: Secondary | ICD-10-CM | POA: Diagnosis not present

## 2023-12-02 DIAGNOSIS — Z860102 Personal history of hyperplastic colon polyps: Secondary | ICD-10-CM | POA: Diagnosis not present

## 2023-12-02 DIAGNOSIS — E1169 Type 2 diabetes mellitus with other specified complication: Secondary | ICD-10-CM | POA: Diagnosis not present

## 2023-12-02 DIAGNOSIS — E559 Vitamin D deficiency, unspecified: Secondary | ICD-10-CM | POA: Diagnosis not present

## 2023-12-02 DIAGNOSIS — F329 Major depressive disorder, single episode, unspecified: Secondary | ICD-10-CM | POA: Diagnosis not present

## 2023-12-02 DIAGNOSIS — E1140 Type 2 diabetes mellitus with diabetic neuropathy, unspecified: Secondary | ICD-10-CM | POA: Diagnosis not present

## 2023-12-02 DIAGNOSIS — Z87891 Personal history of nicotine dependence: Secondary | ICD-10-CM | POA: Diagnosis not present

## 2023-12-02 DIAGNOSIS — Z85028 Personal history of other malignant neoplasm of stomach: Secondary | ICD-10-CM | POA: Diagnosis not present

## 2023-12-02 DIAGNOSIS — Z86018 Personal history of other benign neoplasm: Secondary | ICD-10-CM | POA: Diagnosis not present

## 2023-12-02 DIAGNOSIS — G4733 Obstructive sleep apnea (adult) (pediatric): Secondary | ICD-10-CM | POA: Diagnosis not present

## 2023-12-02 DIAGNOSIS — I10 Essential (primary) hypertension: Secondary | ICD-10-CM | POA: Diagnosis not present

## 2023-12-02 DIAGNOSIS — M797 Fibromyalgia: Secondary | ICD-10-CM | POA: Diagnosis not present

## 2023-12-02 DIAGNOSIS — F411 Generalized anxiety disorder: Secondary | ICD-10-CM | POA: Diagnosis not present

## 2023-12-02 DIAGNOSIS — E0789 Other specified disorders of thyroid: Secondary | ICD-10-CM | POA: Diagnosis not present

## 2023-12-02 DIAGNOSIS — Z8541 Personal history of malignant neoplasm of cervix uteri: Secondary | ICD-10-CM | POA: Diagnosis not present

## 2023-12-02 DIAGNOSIS — Z7984 Long term (current) use of oral hypoglycemic drugs: Secondary | ICD-10-CM | POA: Diagnosis not present

## 2023-12-02 DIAGNOSIS — M542 Cervicalgia: Secondary | ICD-10-CM | POA: Diagnosis not present

## 2023-12-02 DIAGNOSIS — K5730 Diverticulosis of large intestine without perforation or abscess without bleeding: Secondary | ICD-10-CM | POA: Diagnosis not present

## 2023-12-02 DIAGNOSIS — K449 Diaphragmatic hernia without obstruction or gangrene: Secondary | ICD-10-CM | POA: Diagnosis not present

## 2023-12-02 DIAGNOSIS — M810 Age-related osteoporosis without current pathological fracture: Secondary | ICD-10-CM | POA: Diagnosis not present

## 2023-12-02 DIAGNOSIS — E785 Hyperlipidemia, unspecified: Secondary | ICD-10-CM | POA: Diagnosis not present

## 2023-12-02 DIAGNOSIS — K219 Gastro-esophageal reflux disease without esophagitis: Secondary | ICD-10-CM | POA: Diagnosis not present

## 2023-12-02 DIAGNOSIS — K589 Irritable bowel syndrome without diarrhea: Secondary | ICD-10-CM | POA: Diagnosis not present

## 2023-12-02 NOTE — Telephone Encounter (Signed)
Spoke to [employer]

## 2023-12-02 NOTE — Telephone Encounter (Signed)
 Copied from CRM [id number]. Topic: Clinical - Home Health Verbal Orders >> Dec 02, 2023  1:30 PM Berwyn MATSU wrote: Caller/Agency: Bascom from [facility]  Callback Number: [phone] Service Requested:  Nursing  Frequency: 1 x 3 weeks  Any new concerns about the patient? No; but she needs a new glucometer.

## 2023-12-02 NOTE — Telephone Encounter (Signed)
 Left detailed vm for verbal orders

## 2023-12-11 ENCOUNTER — Other Ambulatory Visit: Payer: Self-pay

## 2023-12-11 DIAGNOSIS — E1169 Type 2 diabetes mellitus with other specified complication: Secondary | ICD-10-CM

## 2023-12-11 MED ORDER — LANCET DEVICE MISC: 1.0000 | Freq: Three times a day (TID) | 0 refills | Status: DC

## 2023-12-11 MED ORDER — LANCETS MISC. MISC: 1.0000 | Freq: Three times a day (TID) | 0 refills | Status: AC

## 2023-12-11 MED ORDER — BLOOD GLUCOSE MONITORING SUPPL DEVI: 1.0000 | Freq: Three times a day (TID) | 0 refills | Status: AC

## 2023-12-11 MED ORDER — BLOOD GLUCOSE TEST VI STRP: 1.0000 | ORAL_STRIP | Freq: Three times a day (TID) | 0 refills | Status: AC

## 2023-12-15 NOTE — Progress Notes (Unsigned)
 Tiffany Ortiz [id number] [birth year]   Chief Complaint:  Referring Provider: Tobie Suzzane POUR, MD Primary GI MD: Dr. Legrand  HPI: Tiffany Ortiz is a 78 y.o. female with past medical history of anxiety/depression, arthritis, carcinoid tumor of the stomach, diabetes, fibromyalgia, GERD, HLD, hyperplastic colon polyps, HTN, IBS, osteoporosis, sleep apnea, appendectomy, hysterectomy, bladder tack who presents today for a complaint of *** .    Patient last seen in office 02/25/2023 by Elida Shawl, NP with complaint of alternating constipation and diarrhea as well as intermittent generalized abdominal pain.  She was advised to take Linzess  72 mcg, MiraLAX as needed, Benefiber 1 tablespoon daily.  Empiric treatment for SIBO with metronidazole 250 mg p.o. 3 times daily for 14 days as previously recommended by Dr. Legrand was considered.  CT A/P if abdominal pain persists or worsens. Patient also endorsed recent increased acid reflux.  Advised to continue omeprazole  and add famotidine , GERD diet. Colonoscopy was due 02/2023, per last note patient did not wish to pursue any further colonoscopies. History of pancreatic insufficiency on Creon .   Trouble with GERD Alternating bowel habits  Problems ongoing for at least 10 years Was sent to specialist at [facility] and was advised not to have resection of carcinoid tumor Sees oncology Dr. Deanne  Bowel movements  Miralax caused c  Taking linzess  72 mcg within 30 minutes of taking, helps her go She is concerned it is causing thinning of her wrists and ankles Linzess  sometimes causes diarrhea No blood in stools No melena Feels bowels   Poor historian Daughter is here with her but doesn't live with her  Taking omeprazole  only as needed, dissolved in applesauce Taking Creon  with meals    Taking linzess ? Miralax? Benefiber? Omeprazole ? Famotidine ?  Consider empiric metronidazole for SIBO    Previous GI  Procedures/Imaging   EGD 01/21/2019 - Focal peptic GE junction stricture, dilated to 18mm today.  - Small hiatal hernia.  - Moderate gastritis, biopsied to check for H. pylori  - The previously well documented duodenal nodule was again noted, it is unchanged.  - The examination was otherwise normal.  Colonoscopy 02/04/2013 - Pancolonic diverticulosis - Rectal and colonic polyps removed Path: 3. Colon, polyp(s), ascending - - HYPERPLASTIC POLYP (X1) 4. Colon, biopsy, ascending - HISTOLOGICALLY NORMAL COLONIC MUCOSA - NEGATIVE FOR MORPHOLOGIC FEATURES OF IDIOPATHIC INFLAMMATORY BOWEL DISEASE, MICROSCOPIC COLITIS OR INFECTIOUS COLITIS - NEGATIVE FOR DYSPLASIA 5. Colon, biopsy, descending - HISTOLOGICALLY NORMAL COLONIC MUCOSA - NEGATIVE FOR MORPHOLOGIC FEATURES OF IDIOPATHIC INFLAMMATORY BOWEL DISEASE, MICROSCOPIC COLITIS OR INFECTIOUS COLITIS - NEGATIVE FOR DYSPLASIA 6. Rectum, polyp(s), @ 12 cm - HYPERPLASTIC POLYP (X1)  Past Medical History:  Diagnosis Date   Allergy    Anxiety    Arthritis    Cancer (HCC)    carcinoid tumor stomach   Cataract    REMOVED BILATERAL   Depression    Diabetes mellitus    Fibromyalgia    GERD (gastroesophageal reflux disease)    Headache(784.0)    Hyperlipidemia    Hyperplastic colon polyp 02/04/2013   Hypertension    IBS (irritable bowel syndrome)    Osteoporosis    Polyp of rectum 02/04/2013   Renal disorder    Shortness of breath    Sleep apnea    Vitamin D deficiency     Past Surgical History:  Procedure Laterality Date   ABDOMINAL HYSTERECTOMY     Cervical cancer, with incidental appendectomy   APPENDECTOMY     BIOPSY N/A 02/04/2013  Procedure: BIOPSY;  Surgeon: Lamar CHRISTELLA Hollingshead, MD;  [facility];  Service: Endoscopy;  Laterality: N/A;  SB BX AND RANDOM COLON BX   bladder tack     BREAST LUMPECTOMY     benign   COLONOSCOPY  09/22/2003   RMR: Diminutive rectal polyps cold biopsy/removed.  Otherwise normal rectal  mucosa/Pancolonic diverticula.  The remainder of the colonic mucosa appeared normal   COLONOSCOPY WITH ESOPHAGOGASTRODUODENOSCOPY (EGD) N/A 02/04/2013   Procedure: COLONOSCOPY WITH ESOPHAGOGASTRODUODENOSCOPY (EGD);  Surgeon: Lamar CHRISTELLA Hollingshead, MD;  Location: [facility];  Service: Endoscopy;  Laterality: N/A;  10:15-moved to 9:30am   ESOPHAGOGASTRODUODENOSCOPY  11/19/2006   MFM:Emnfpwzwu Schatzki's ring with overlying distal esophageal erosions consistent with erosive reflux esophagitis, status post dilation and disruption of ring as described above/ Small hiatal hernia/Otherwise normal stomach, first duodenum and second duodenum   EUS N/A 02/12/2013   Procedure: UPPER ENDOSCOPIC ULTRASOUND (EUS) LINEAR;  Surgeon: Toribio SHAUNNA Cedar, MD;  Location: [facility];  Service: Endoscopy;  Laterality: N/A;   EUS N/A 08/20/2013   Procedure: UPPER ENDOSCOPIC ULTRASOUND (EUS) LINEAR;  Surgeon: Toribio SHAUNNA Cedar, MD;  Location: [facility];  Service: Endoscopy;  Laterality: N/A;   LAPAROSCOPIC SMALL BOWEL RESECTION N/A 03/31/2013   Procedure: LAPAROSCOPIC  ASSISTED CONVERTED TO OPEN RESECTION DUODENAL MASS/UPPER ENDOSCOPY;  Surgeon: Jina Nephew, MD;  Location: [facility];  Service: General;  Laterality: N/A;   TONSILLECTOMY      Current Outpatient Medications  Medication Sig Dispense Refill   amLODipine  (NORVASC ) 5 MG tablet Take 1 tablet (5 mg total) by mouth daily. 90 tablet 3   b complex vitamins capsule Take 1 capsule by mouth daily.     Blood Glucose Monitoring Suppl DEVI 1 each by Does not apply route in the morning, at noon, and at bedtime. May substitute to any manufacturer covered by patient's insurance. DX e11.65 1 each 0   chlorthalidone (HYGROTON) 25 MG tablet TAKE ONE TABLET BY MOUTH ONCE DAILY. 90 tablet 3   Cholecalciferol 125 MCG (5000 UT) TABS Take 1 capsule by mouth daily.     clonazePAM  (KLONOPIN ) 0.5 MG tablet Take 0.5 tablets (0.25 mg total) by mouth 2 (two) times daily as needed for anxiety. 30  tablet 0   Coenzyme Q10 (CO Q-10 PO) Take 1 tablet by mouth daily.     fexofenadine (ALLEGRA) 30 MG tablet Take 30 mg by mouth daily.      glipiZIDE  (GLUCOTROL  XL) 10 MG 24 hr tablet Take 1 tablet (10 mg total) by mouth daily with breakfast. 30 tablet 2   Glucose Blood (BLOOD GLUCOSE TEST STRIPS) STRP 1 each by In Vitro route in the morning, at noon, and at bedtime. May substitute to any manufacturer covered by patient's insurance. 100 strip 0   hyoscyamine  (LEVBID ) 0.375 MG 12 hr tablet TAKE 2 TABLETS BY MOUTH AS NEEDED DAILY. 60 tablet 3   Lancet Device MISC 1 each by Does not apply route in the morning, at noon, and at bedtime. May substitute to any manufacturer covered by patient's insurance. 1 each 0   Lancets Misc. MISC 1 each by Does not apply route in the morning, at noon, and at bedtime. May substitute to any manufacturer covered by patient's insurance. 100 each 0   LINZESS  72 MCG capsule TAKE ONE CAPSULE BY MOUTH ONCE DAILY. TAKE 30 MINUTES BEFORE BREAKFAST (Patient taking differently: daily as needed.) 30 capsule 2   lipase/protease/amylase (CREON ) 12000-38000 units CPEP capsule Take 2 capsules daily with each  meal 540 capsule 0   lisinopril  (ZESTRIL ) 10 MG tablet Take 1 tablet (10 mg total) by mouth daily. 90 tablet 3   Multiple Vitamin (MULTIVITAMIN) tablet Take 1 tablet by mouth daily. With Vit D     Omega-3 Fatty Acids (OMEGA 3 PO) Take 1 capsule by mouth daily. With Vit D     omeprazole  (PRILOSEC) 40 MG capsule Take 40 mg by mouth daily.     oxymetazoline  (AFRIN) 0.05 % nasal spray Place 1 spray into both nostrils 2 (two) times daily as needed for congestion.     potassium chloride  SA (KLOR-CON  M) 20 MEQ tablet Take 1 tablet (20 mEq total) by mouth 2 (two) times daily for 7 days. 14 tablet 0   pravastatin  (PRAVACHOL ) 40 MG tablet Take 1 tablet (40 mg total) by mouth daily. 90 tablet 3   pregabalin  (LYRICA ) 25 MG capsule Take 1 capsule (25 mg total) by mouth 2 (two) times daily. 60  capsule 0   Probiotic Product (PROBIOTIC PO) Take 1 tablet by mouth daily.     Propylene Glycol-Glycerin (SOOTHE OP) Place 1 drop into both eyes 2 (two) times daily as needed.      RESTASIS 0.05 % ophthalmic emulsion Place into both eyes 2 (two) times daily.      sertraline  (ZOLOFT ) 100 MG tablet Take 1 tablet (100 mg total) by mouth daily. 90 tablet 1   UNABLE TO FIND Take 1 capsule by mouth daily. Med Name: Phytoceramides     UNABLE TO FIND Take 1 capsule by mouth daily. Med Name: CinnaChroma     UNABLE TO FIND Take 2 capsules by mouth daily. Med Name: PC Liver and Brain     valACYclovir  (VALTREX ) 500 MG tablet Take 1 tablet (500 mg total) by mouth as needed. Reported on 10/31/2015 90 tablet 1   No current facility-administered medications for this visit.    Allergies as of 12/16/2023 - Review Complete 12/16/2023  Allergen Reaction Noted   Other  01/28/2013   Asa [aspirin]  02/06/2013   Erythromycin  01/28/2013   Famciclovir  02/11/2013   Hydrocodone  02/04/2013   Ibuprofen  02/06/2013   Lactose intolerance (gi) Diarrhea and Other (See Comments) 03/31/2013   Morphine  and codeine Other (See Comments) 09/27/2014   Oxycodone  01/28/2013   Promethazine Nausea And Vomiting 01/30/2011   Tilactase Diarrhea 11/30/2013   Tylenol [acetaminophen]  02/06/2013    Family History  Problem Relation Age of Onset   Cancer Mother        ?ovarian   Cancer Father    Colon cancer Neg Hx    Colon polyps Neg Hx    Esophageal cancer Neg Hx    Rectal cancer Neg Hx    Stomach cancer Neg Hx    Pancreatic cancer Neg Hx     Social History   Tobacco Use   Smoking status: Former    Types: Cigarettes   Smokeless tobacco: Former    Quit date: 02/11/1970  Vaping Use   Vaping status: Never Used  Substance Use Topics   Alcohol  use: No   Drug use: No     Review of Systems:    Constitutional: No weight loss, fever, chills, weakness or fatigue Eyes: No change in vision Ears, Nose, Throat:  No  change in hearing or congestion Skin: No rash or itching Cardiovascular: No chest pain, chest pressure or palpitations   Respiratory: No SOB or cough Gastrointestinal: See HPI and otherwise negative Genitourinary: No dysuria or  change in urinary frequency Neurological: No headache, dizziness or syncope Musculoskeletal: No new muscle or joint pain Hematologic: No bleeding or bruising    Physical Exam:  Vital signs: BP 130/80   Pulse 77   Ht 5' (1.524 m)   Wt 144 lb (65.3 kg)   BMI 28.12 kg/m   Constitutional: NAD, Well developed, Well nourished, alert and cooperative Head:  Normocephalic and atraumatic.  Eyes: No scleral icterus. Conjunctiva pink. Mouth: No oral lesions. Respiratory: Respirations even and unlabored. Lungs clear to auscultation bilaterally.  No wheezes, crackles, or rhonchi.  Cardiovascular:  Regular rate and rhythm. No murmurs. No peripheral edema. Gastrointestinal:  Soft, nondistended, nontender. No rebound or guarding. Normal bowel sounds. No appreciable masses or hepatomegaly. Rectal:  Not performed.  Neurologic:  Alert and oriented x4;  grossly normal neurologically.  Skin:   Dry and intact without significant lesions or rashes. Psychiatric: Oriented to person, place and time. Demonstrates good judgement and reason without abnormal affect or behaviors.   RELEVANT LABS AND IMAGING: CBC    Component Value Date/Time   WBC 9.1 04/08/2023 1527   WBC 8.2 07/19/2022 1602   RBC 4.79 04/08/2023 1527   RBC 4.81 07/19/2022 1602   HGB 14.4 04/08/2023 1527   HGB 13.2 12/01/2012 0000   HCT 42.3 04/08/2023 1527   HCT 39 12/01/2012 0000   PLT 359 04/08/2023 1527   MCV 88 04/08/2023 1527   MCH 30.1 04/08/2023 1527   MCH 29.5 04/05/2013 0508   MCHC 34.0 04/08/2023 1527   MCHC 34.2 07/19/2022 1602   RDW 12.4 04/08/2023 1527   LYMPHSABS 3.2 (H) 04/08/2023 1527   MONOABS 0.7 07/19/2022 1602   EOSABS 0.3 04/08/2023 1527   BASOSABS 0.1 04/08/2023 1527    CMP      Component Value Date/Time   NA 144 04/08/2023 1527   K 4.3 04/08/2023 1527   K 4.1 12/01/2012 0000   CL 106 04/08/2023 1527   CO2 22 04/08/2023 1527   GLUCOSE 111 (H) 04/08/2023 1527   GLUCOSE 201 (H) 02/25/2023 1613   BUN 22 04/08/2023 1527   CREATININE 0.85 04/08/2023 1527   CREATININE 0.9 12/01/2012 0000   CALCIUM 10.0 04/08/2023 1527   CALCIUM 9.7 12/01/2012 0000   PROT 7.1 02/25/2023 1613   ALBUMIN 3.9 02/25/2023 1613   ALBUMIN 4.0 12/01/2012 0000   AST 22 02/25/2023 1613   AST 17 12/01/2012 0000   ALT 27 02/25/2023 1613   ALKPHOS 84 02/25/2023 1613   ALKPHOS 107.0 12/01/2012 0000   BILITOT 0.3 02/25/2023 1613   BILITOT 0.3 12/01/2012 0000   GFRNONAA 71 (L) 04/05/2013 0508   GFRAA 83 (L) 04/05/2013 0508     Assessment/Plan:    Benefiber Continue linzess  as needed Take omeprazole  Discuss empiric tx with metronidazole   Camie Furbish, PA-C [facility] 12/16/2023, 4:08 PM  Patient Care Team: Tobie Suzzane POUR, MD as PCP - General (Internal Medicine) Aron Shoulders, MD as Consulting Physician (General Surgery) Teressa Toribio SQUIBB, MD (Inactive) as Attending Physician (Gastroenterology) Dr Will[facility], OD

## 2023-12-16 ENCOUNTER — Encounter: Payer: Self-pay | Admitting: Gastroenterology

## 2023-12-16 ENCOUNTER — Ambulatory Visit: Admitting: Gastroenterology

## 2023-12-16 ENCOUNTER — Other Ambulatory Visit: Payer: Self-pay | Admitting: Internal Medicine

## 2023-12-16 VITALS — BP 130/80 | HR 77 | Ht 60.0 in | Wt 144.0 lb

## 2023-12-16 DIAGNOSIS — K8689 Other specified diseases of pancreas: Secondary | ICD-10-CM

## 2023-12-16 DIAGNOSIS — R198 Other specified symptoms and signs involving the digestive system and abdomen: Secondary | ICD-10-CM

## 2023-12-16 DIAGNOSIS — K219 Gastro-esophageal reflux disease without esophagitis: Secondary | ICD-10-CM

## 2023-12-16 DIAGNOSIS — R197 Diarrhea, unspecified: Secondary | ICD-10-CM

## 2023-12-16 DIAGNOSIS — K5900 Constipation, unspecified: Secondary | ICD-10-CM | POA: Diagnosis not present

## 2023-12-16 DIAGNOSIS — F419 Anxiety disorder, unspecified: Secondary | ICD-10-CM

## 2023-12-16 NOTE — Patient Instructions (Signed)
 Camie Furbish, PA-C, recommends you take daily benefiber 1 tbsp (work up to this).   Continue Linzess  72 mcg as needed.   Take Omeprazole  daily.   _______________________________________________________  If your blood pressure at your visit was 140/90 or greater, please contact your primary care physician to follow up on this.  _______________________________________________________  If you are age 78 or older, your body mass index should be between 23-30. Your Body mass index is 28.12 kg/m. If this is out of the aforementioned range listed, please consider follow up with your Primary Care Provider.  If you are age 29 or younger, your body mass index should be between 19-25. Your Body mass index is 28.12 kg/m. If this is out of the aformentioned range listed, please consider follow up with your Primary Care Provider.   ________________________________________________________  The [facility] providers would like to encourage you to use MYCHART to communicate with providers for non-urgent requests or questions.  Due to long hold times on the telephone, sending your provider a message by [facility] may be a faster and more efficient way to get a response.  Please allow 48 business hours for a response.  Please remember that this is for non-urgent requests.  _______________________________________________________  [facility] is using a team-based approach to care.  Your team is made up of your doctor and two to three APPS. Our APPS (Nurse Practitioners and Physician Assistants) work with your physician to ensure care continuity for you. They are fully qualified to address your health concerns and develop a treatment plan. They communicate directly with your gastroenterologist to care for you. Seeing the Advanced Practice Practitioners on your physician's team can help you by facilitating care more promptly, often allowing for earlier appointments, access to diagnostic testing, procedures,  and other specialty referrals.

## 2023-12-17 ENCOUNTER — Encounter: Payer: Self-pay | Admitting: Gastroenterology

## 2023-12-18 NOTE — Progress Notes (Signed)
 ____________________________________________________________  Attending physician addendum:  Thank you for sending this case to me. I have reviewed the entire note and agree with the plan.  Difficult to say, and doubtful I will be able to get a clearer history when I see her. Yes, I would recommend a 10 day course of empiric therapy for possible SIBO with metronidazole 250 mg three times daily for 10 days.  Victory Brand, MD  ____________________________________________________________

## 2023-12-20 ENCOUNTER — Telehealth: Payer: Self-pay | Admitting: Gastroenterology

## 2023-12-20 NOTE — Telephone Encounter (Signed)
 Please let patient know I have discussed with Dr. Legrand and he has suggested a 10 day course of empiric antibiotic therapy for possible SIBO, metronidazole  250 mg three times daily for 10 days, to see if this might help with her alternating constipation and diarrhea.  If patient is interested in this, can go ahead and prescribe for diagnosis of alternating constipation and diarrhea and generalized abdominal pain.

## 2023-12-23 NOTE — Telephone Encounter (Signed)
 Attempted to reach patient to discuss provider recommendation. No answer, vm not set up. Unable to leave message.

## 2023-12-24 NOTE — Telephone Encounter (Signed)
 2nd attempt to reach patient to discuss provider recommendations. No answer, unable to leave vm.

## 2023-12-25 ENCOUNTER — Ambulatory Visit: Payer: Self-pay

## 2023-12-25 ENCOUNTER — Ambulatory Visit
Admission: RE | Admit: 2023-12-25 | Discharge: 2023-12-25 | Disposition: A | Discharge status: Home / Self Care | Source: Ambulatory Visit | Attending: Nurse Practitioner | Admitting: Nurse Practitioner

## 2023-12-25 ENCOUNTER — Ambulatory Visit (HOSPITAL_COMMUNITY)
Admission: RE | Admit: 2023-12-25 | Discharge: 2023-12-25 | Disposition: A | Discharge status: Home / Self Care | Source: Ambulatory Visit | Attending: Nurse Practitioner | Admitting: Nurse Practitioner

## 2023-12-25 ENCOUNTER — Telehealth: Payer: Self-pay | Admitting: Nurse Practitioner

## 2023-12-25 VITALS — BP 167/91 | HR 88 | Temp 98.8°F | Resp 20

## 2023-12-25 DIAGNOSIS — M549 Dorsalgia, unspecified: Secondary | ICD-10-CM | POA: Diagnosis present

## 2023-12-25 DIAGNOSIS — M5441 Lumbago with sciatica, right side: Secondary | ICD-10-CM

## 2023-12-25 DIAGNOSIS — M545 Low back pain, unspecified: Secondary | ICD-10-CM | POA: Diagnosis not present

## 2023-12-25 DIAGNOSIS — I700 Atherosclerosis of aorta: Secondary | ICD-10-CM | POA: Diagnosis not present

## 2023-12-25 DIAGNOSIS — G8929 Other chronic pain: Secondary | ICD-10-CM

## 2023-12-25 DIAGNOSIS — M47896 Other spondylosis, lumbar region: Secondary | ICD-10-CM | POA: Insufficient documentation

## 2023-12-25 DIAGNOSIS — M47816 Spondylosis without myelopathy or radiculopathy, lumbar region: Secondary | ICD-10-CM | POA: Diagnosis not present

## 2023-12-25 DIAGNOSIS — M5442 Lumbago with sciatica, left side: Secondary | ICD-10-CM | POA: Diagnosis not present

## 2023-12-25 MED ORDER — ACETAMINOPHEN ER 650 MG PO TBCR: 650.0000 mg | EXTENDED_RELEASE_TABLET | Freq: Three times a day (TID) | ORAL | 0 refills | Status: AC | PRN

## 2023-12-25 MED ORDER — TIZANIDINE HCL 2 MG PO TABS: 2.0000 mg | ORAL_TABLET | Freq: Three times a day (TID) | ORAL | 0 refills | Status: DC | PRN

## 2023-12-25 NOTE — ED Triage Notes (Signed)
 Pt reports left side back pain into the buttocks and thigh,ongoing 3 days, states he also  has a bulging disc in her back. Pt has tried tylenol  but it has not helped.

## 2023-12-25 NOTE — Telephone Encounter (Signed)
 Scheduled pt for UC appt at [facility] d/t no in office availability.  FYI Only or Action Required?: FYI only for provider.  Patient was last seen in primary care on 11/14/2023 by Tobie Suzzane POUR, MD.  Called Nurse Triage reporting Triage.  Symptoms began yesterday.  Interventions attempted: OTC medications: tylenol .  Symptoms are: gradually worsening.  Triage Disposition: See PCP When Office is Open (Within 3 Days)  Patient/caregiver understands and will follow disposition?: Yes Reason for Disposition  [1] Pain radiates into the thigh or further down the leg AND [2] one leg  Answer Assessment - Initial Assessment Questions 1. ONSET: When did the pain begin? (e.g., minutes, hours, days)     12/24/23  2. LOCATION: Where does it hurt? (upper, mid or lower back)     Left lower back, radiates down left leg  3. SEVERITY: How bad is the pain?  (e.g., Scale 1-10; mild, moderate, or severe)     10/10  4. PATTERN: Is the pain constant? (e.g., yes, no; constant, intermittent)      Constant  5. RADIATION: Does the pain shoot into your legs or somewhere else?     Down left leg  6. CAUSE:  What do you think is causing the back pain?      Possible sciatica, states hx of  8. MEDICINES: What have you taken so far for the pain? (e.g., nothing, acetaminophen , NSAIDS)           9. NEUROLOGIC SYMPTOMS: Do you have any weakness, numbness, or problems with bowel/bladder control?     Aldean Pipe  Protocols used: Back Pain-A-AH Copied from [employer] [id number]. Topic: Clinical - Red Word Triage >> Dec 25, 2023 11:59 AM DeAngela L wrote: Red Word that prompted transfer to Nurse Triage: patient is having sharp shooting pain on left side of lower back down through the left left  [name] with [facility] is on the line with patient there also  Pt num [phone]

## 2023-12-25 NOTE — Telephone Encounter (Signed)
 Called patient to discuss the results of the lumbar spine x-ray.  Spoke with patient, verified patient with 2 patient identifiers.  Patient was advised that the x-ray was negative for fracture or dislocation.  Patient advised we will proceed with current treatment recommendations.  Patient was also advised if symptoms continue to persist, recommend follow-up with her PCP to discuss the need for referral to orthopedics or further evaluation.  Patient was in agreement with this plan of care and verbalized understanding.  All questions were answered.

## 2023-12-25 NOTE — ED Provider Notes (Signed)
 [facility]    CSN: [id number] Arrival date & time: 12/25/23  1248      History   Chief Complaint Chief Complaint  Patient presents with   Back Pain    Entered by patient    HPI Tiffany Ortiz is a 78 y.o. female.   The history is provided by the patient and a relative.   Patient presents for complaints of left-sided low back pain has been present for the past 24 hours.  Patient states the pain radiates down into the left leg.  She states that she also has some numbness and tingling in the bilateral lower extremities.  Patient denies any recent injury or trauma, but reports history of falls.  She denies any recent falls.  Patient ambulates with a cane at baseline.  Patient denies fever, chills, loss of bowel or bladder function, saddle anesthesia, or urinary symptoms.  Patient states that she does have a history of a bulging disc and sciatica.  So far, she has been taking Tylenol  for her symptoms.  Per review of the chart, patient with underlying history of chronic kidney disease.  She also informs that her most recent A1c was around 10, I believe.  Past Medical History:  Diagnosis Date   Allergy    Anxiety    Arthritis    Cancer (HCC)    carcinoid tumor stomach   Cataract    REMOVED BILATERAL   Depression    Diabetes mellitus    Fibromyalgia    GERD (gastroesophageal reflux disease)    Headache(784.0)    Hyperlipidemia    Hyperplastic colon polyp 02/04/2013   Hypertension    IBS (irritable bowel syndrome)    Osteoporosis    Polyp of rectum 02/04/2013   Renal disorder    Shortness of breath    Sleep apnea    Vitamin D deficiency     Patient Active Problem List   Diagnosis Date Noted   Type 2 diabetes mellitus with diabetic neuropathy, without long-term current use of insulin  (HCC) 11/14/2023   Gait disturbance 11/14/2023   Candida vaginitis 07/08/2023   Microalbuminuria due to type 2 diabetes mellitus (HCC) 04/08/2023   Decreased GFR  02/28/2023   Need for influenza vaccination 02/28/2023   Concern about memory 02/28/2023   Hyperlipidemia associated with type 2 diabetes mellitus (HCC) 11/28/2022   Anxiety 11/06/2022   Type 2 diabetes mellitus with other specified complication (HCC) 11/06/2022   OSA on CPAP 11/06/2022   Essential hypertension 11/06/2022   Bilateral primary osteoarthritis of knee 11/06/2022   Chronic constipation 01/07/2022   Pancreatic insufficiency 10/26/2016   Diarrhea 10/26/2016   Carcinoid tumor of intestine 03/31/2013   Chronic diarrhea 01/28/2013   GERD (gastroesophageal reflux disease) 01/28/2013   Esophageal dysphagia 01/28/2013    Past Surgical History:  Procedure Laterality Date   ABDOMINAL HYSTERECTOMY     Cervical cancer, with incidental appendectomy   APPENDECTOMY     BIOPSY N/A 02/04/2013   Procedure: BIOPSY;  Surgeon: Lamar CHRISTELLA Hollingshead, MD;  Location: [facility];  Service: Endoscopy;  Laterality: N/A;  SB BX AND RANDOM COLON BX   bladder tack     BREAST LUMPECTOMY     benign   COLONOSCOPY  09/22/2003   RMR: Diminutive rectal polyps cold biopsy/removed.  Otherwise normal rectal mucosa/Pancolonic diverticula.  The remainder of the colonic mucosa appeared normal   COLONOSCOPY WITH ESOPHAGOGASTRODUODENOSCOPY (EGD) N/A 02/04/2013   Procedure: COLONOSCOPY WITH ESOPHAGOGASTRODUODENOSCOPY (EGD);  Surgeon: Lamar CHRISTELLA Hollingshead, MD;  Location: [facility];  Service: Endoscopy;  Laterality: N/A;  10:15-moved to 9:30am   ESOPHAGOGASTRODUODENOSCOPY  11/19/2006   MFM:Emnfpwzwu Schatzki's ring with overlying distal esophageal erosions consistent with erosive reflux esophagitis, status post dilation and disruption of ring as described above/ Small hiatal hernia/Otherwise normal stomach, first duodenum and second duodenum   EUS N/A 02/12/2013   Procedure: UPPER ENDOSCOPIC ULTRASOUND (EUS) LINEAR;  Surgeon: Toribio SHAUNNA Cedar, MD;  Location: [facility];  Service: Endoscopy;  Laterality: N/A;   EUS N/A  08/20/2013   Procedure: UPPER ENDOSCOPIC ULTRASOUND (EUS) LINEAR;  Surgeon: Toribio SHAUNNA Cedar, MD;  Location: [facility];  Service: Endoscopy;  Laterality: N/A;   LAPAROSCOPIC SMALL BOWEL RESECTION N/A 03/31/2013   Procedure: LAPAROSCOPIC  ASSISTED CONVERTED TO OPEN RESECTION DUODENAL MASS/UPPER ENDOSCOPY;  Surgeon: Jina Nephew, MD;  Location: [facility];  Service: General;  Laterality: N/A;   TONSILLECTOMY      OB History   No obstetric history on file.      Home Medications    Prior to Admission medications   Medication Sig Start Date End Date Taking? Authorizing Provider  amLODipine  (NORVASC ) 5 MG tablet Take 1 tablet (5 mg total) by mouth daily. 09/06/23   Dixon, Phillip E, MD  b complex vitamins capsule Take 1 capsule by mouth daily. 10/27/13   [provider]  Blood Glucose Monitoring Suppl DEVI 1 each by Does not apply route in the morning, at noon, and at bedtime. May substitute to any manufacturer covered by patient's insurance. DX e11.65 12/11/23   Patel, Rutwik K, MD  chlorthalidone (HYGROTON) 25 MG tablet TAKE ONE TABLET BY MOUTH ONCE DAILY. 06/17/23   Melvenia Manus BRAVO, MD  Cholecalciferol 125 MCG (5000 UT) TABS Take 1 capsule by mouth daily. 10/13/13   [provider]  clonazePAM  (KLONOPIN ) 0.5 MG tablet TAKE 1 TABLET DAILY AS NEEDED FOR ANXIETY 12/16/23   Patel, Rutwik K, MD  Coenzyme Q10 (CO Q-10 PO) Take 1 tablet by mouth daily.    [provider]  fexofenadine (ALLEGRA) 30 MG tablet Take 30 mg by mouth daily.     [provider]  glipiZIDE  (GLUCOTROL  XL) 10 MG 24 hr tablet Take 1 tablet (10 mg total) by mouth daily with breakfast. 11/21/23   Tobie Suzzane POUR, MD  Glucose Blood (BLOOD GLUCOSE TEST STRIPS) STRP 1 each by In Vitro route in the morning, at noon, and at bedtime. May substitute to any manufacturer covered by patient's insurance. 12/11/23 01/10/24  Tobie Suzzane POUR, MD  hyoscyamine  (LEVBID ) 0.375 MG 12 hr tablet TAKE 2 TABLETS BY MOUTH AS NEEDED  DAILY. 05/15/23   Melvenia Manus BRAVO, MD  Lancet Device MISC 1 each by Does not apply route in the morning, at noon, and at bedtime. May substitute to any manufacturer covered by patient's insurance. 12/11/23 01/10/24  Tobie Suzzane POUR, MD  Lancets Misc. MISC 1 each by Does not apply route in the morning, at noon, and at bedtime. May substitute to any manufacturer covered by patient's insurance. 12/11/23 01/10/24  Tobie Suzzane POUR, MD  LINZESS  72 MCG capsule TAKE ONE CAPSULE BY MOUTH ONCE DAILY. TAKE 30 MINUTES BEFORE BREAKFAST Patient taking differently: daily as needed. 02/01/23   Kennedy-Smith, Colleen M, NP  lipase/protease/amylase (CREON ) 12000-38000 units CPEP capsule Take 2 capsules daily with each meal 04/08/23   Melvenia Manus BRAVO, MD  lisinopril  (ZESTRIL ) 10 MG tablet Take 1 tablet (10 mg total) by mouth daily. 09/06/23   Melvenia Manus BRAVO, MD  Multiple  Vitamin (MULTIVITAMIN) tablet Take 1 tablet by mouth daily. With Vit D    [provider]  Omega-3 Fatty Acids (OMEGA 3 PO) Take 1 capsule by mouth daily. With Vit D    [provider]  omeprazole  (PRILOSEC) 40 MG capsule Take 40 mg by mouth daily. 08/09/21   [provider]  oxymetazoline  (AFRIN) 0.05 % nasal spray Place 1 spray into both nostrils 2 (two) times daily as needed for congestion.    [provider]  potassium chloride  SA (KLOR-CON  M) 20 MEQ tablet Take 1 tablet (20 mEq total) by mouth 2 (two) times daily for 7 days. 07/25/22 12/16/23  Kennedy-Smith, Colleen M, NP  pravastatin  (PRAVACHOL ) 40 MG tablet Take 1 tablet (40 mg total) by mouth daily. 04/09/23   Melvenia Manus BRAVO, MD  pregabalin  (LYRICA ) 25 MG capsule Take 1 capsule (25 mg total) by mouth 2 (two) times daily. 11/14/23   Tobie Suzzane POUR, MD  Probiotic Product (PROBIOTIC PO) Take 1 tablet by mouth daily.    [provider]  Propylene Glycol-Glycerin (SOOTHE OP) Place 1 drop into both eyes 2 (two) times daily as needed.     [provider]   RESTASIS 0.05 % ophthalmic emulsion Place into both eyes 2 (two) times daily.  11/24/18   [provider]  sertraline  (ZOLOFT ) 100 MG tablet Take 1 tablet (100 mg total) by mouth daily. 11/14/23   Tobie Suzzane POUR, MD  UNABLE TO FIND Take 1 capsule by mouth daily. Med Name: Phytoceramides    [provider]  UNABLE TO FIND Take 1 capsule by mouth daily. Med Name: CinnaChroma    [provider]  UNABLE TO FIND Take 2 capsules by mouth daily. Med Name: PC Liver and Brain    [provider]  valACYclovir  (VALTREX ) 500 MG tablet Take 1 tablet (500 mg total) by mouth as needed. Reported on 10/31/2015 04/08/23   Melvenia Manus BRAVO, MD    Family History Family History  Problem Relation Age of Onset   Cancer Mother        ?ovarian   Cancer Father    Colon cancer Neg Hx    Colon polyps Neg Hx    Esophageal cancer Neg Hx    Rectal cancer Neg Hx    Stomach cancer Neg Hx    Pancreatic cancer Neg Hx     Social History Social History   Tobacco Use   Smoking status: Former    Types: Cigarettes   Smokeless tobacco: Former    Quit date: 02/11/1970  Vaping Use   Vaping status: Never Used  Substance Use Topics   Alcohol  use: No   Drug use: No     Allergies   Other, Asa [aspirin], Erythromycin, Famciclovir, Hydrocodone, Ibuprofen, Lactose intolerance (gi), Morphine  and codeine, Oxycodone, Promethazine, Tilactase, and Tylenol  [acetaminophen ]   Review of Systems Review of Systems Per HPI  Physical Exam Triage Vital Signs ED Triage Vitals  Encounter Vitals Group     BP 12/25/23 1333 (!) 215/68     Girls Systolic BP Percentile --      Girls Diastolic BP Percentile --      Boys Systolic BP Percentile --      Boys Diastolic BP Percentile --      Pulse Rate 12/25/23 1333 88     Resp 12/25/23 1333 20     Temp 12/25/23 1333 98.8 F (37.1 C)     Temp Source 12/25/23 1333 Oral  SpO2 12/25/23 1333 94 %     Weight --      Height --      Head  Circumference --      Peak Flow --      Pain Score 12/25/23 1334 8     Pain Loc --      Pain Education --      Exclude from Growth Chart --    No data found.  Updated Vital Signs BP (!) 167/91 (BP Location: Right Arm)   Pulse 88   Temp 98.8 F (37.1 C) (Oral)   Resp 20   SpO2 94%   Visual Acuity Right Eye Distance:   Left Eye Distance:   Bilateral Distance:    Right Eye Near:   Left Eye Near:    Bilateral Near:     Physical Exam Vitals and nursing note reviewed.  Constitutional:      General: She is not in acute distress.    Appearance: Normal appearance.  HENT:     Head: Normocephalic.  Eyes:     Extraocular Movements: Extraocular movements intact.     Pupils: Pupils are equal, round, and reactive to light.  Cardiovascular:     Rate and Rhythm: Normal rate and regular rhythm.     Pulses: Normal pulses.     Heart sounds: Normal heart sounds.  Pulmonary:     Effort: Pulmonary effort is normal.     Breath sounds: Normal breath sounds.  Abdominal:     General: Bowel sounds are normal.     Palpations: Abdomen is soft.  Musculoskeletal:     Cervical back: Normal range of motion.       Back:  Skin:    General: Skin is warm and dry.  Neurological:     General: No focal deficit present.     Mental Status: She is alert and oriented to person, place, and time.  Psychiatric:        Mood and Affect: Mood normal.        Behavior: Behavior normal.      UC Treatments / Results  Labs (all labs ordered are listed, but only abnormal results are displayed) Labs Reviewed - No data to display  EKG   Radiology No results found.  Procedures Procedures (including critical care time)  Medications Ordered in UC Medications - No data to display  Initial Impression / Assessment and Plan / UC Course  I have reviewed the triage vital signs and the nursing notes.  Pertinent labs & imaging results that were available during my care of the patient were reviewed by me  and considered in my medical decision making (see chart for details).  Patient dents for complaints of left-sided low back pain with radiation into the left leg.  She also reports numbness and tingling in the bilateral lower extremities, symptoms are worse in the left extremity.  Patient is hypertensive in our clinic today, she also has a noted history of chronic kidney disease, also reports her most recent A1c was 10.  Patient with history of recurrent falls, she notes no recent falls.  Patient also reports underlying history of chronic back pain.  X-ray of the lumbar spine ordered as patient has not had any imaging that this provider can see since 2008.  Symptomatic treatment provided with Tylenol  650 mg along with tizanidine  2 mg.  Supportive care recommendations were provided and discussed with the patient and her family to include the use of ice or heat, and  helping the patient remain active.  Patient was given strict ER follow-up precautions.  Patient was advised to follow-up with her PCP for further evaluation if symptoms fail to improve.  Patient was in agreement with this plan of care and verbalizes understanding.  All questions were answered.  Patient stable for discharge.  Final Clinical Impressions(s) / UC Diagnoses   Final diagnoses:  None   Discharge Instructions   None    ED Prescriptions   None    PDMP not reviewed this encounter.   Gilmer Etta PARAS, NP 12/25/23 1419

## 2023-12-25 NOTE — Discharge Instructions (Signed)
 Please go to [facility] for an x-ray of your low back.  You will need to go to the main entrance of the hospital to get to the radiology department.  You will be contacted when the results of the x-ray are received.  You will also have access to your results via MyChart. Take medication as prescribed. Recommend the use of ice or heat.  Apply ice for pain or swelling, heat for spasm or stiffness.  Apply for 20 minutes, remove for 1 hour, repeat as needed. Go to the emergency department immediately if you experience loss of bowel or bladder function, become unable to ambulate, or if pain becomes more severe. Please follow-up with your primary care physician for further evaluation if symptoms fail to improve. Follow-up as needed.

## 2023-12-25 NOTE — Telephone Encounter (Signed)
 3rd attempt to reach patient to discuss provider recommendations. No answer, unable to leave vm. Will send my chart message to patient. She does check her my chart messages

## 2023-12-26 ENCOUNTER — Encounter (INDEPENDENT_AMBULATORY_CARE_PROVIDER_SITE_OTHER): Admitting: Internal Medicine

## 2023-12-26 MED ORDER — METRONIDAZOLE 250 MG PO TABS: 250.0000 mg | ORAL_TABLET | Freq: Three times a day (TID) | ORAL | 0 refills | Status: AC

## 2023-12-26 NOTE — Addendum Note (Signed)
 Addended by: NICHOLAUS JARVIS on: 12/26/2023 04:19 PM   Modules accepted: Orders

## 2023-12-26 NOTE — Progress Notes (Signed)
 Erroneous encounter - please disregard.

## 2023-12-26 NOTE — Telephone Encounter (Signed)
 Spoke with patient's daughter. She states that she spoke with her mom about trying the Metronidazole  and they agreed they would like to try it. Advised her to abstain from alcohol  while taking this medication. She verbalized understanding. Pharmacy verified. Will send rx. Advised daughter to call back with symptom update after treatment.

## 2024-01-01 ENCOUNTER — Telehealth: Payer: Self-pay

## 2024-01-01 NOTE — Telephone Encounter (Signed)
 Med list faxed to home health agency.

## 2024-01-01 NOTE — Telephone Encounter (Signed)
 Copied from CRM [id number]. Topic: General - Other >> Jan 01, 2024 11:53 AM Jasmin G wrote: Reason for CRM: Mrs. [name], nurse, from [facility] called due to recently being an increased confusion regarding pt's meds, she states that pt hasn't been taking the correct meds that she needs due to daughter not refilling them appropriately, Ms. [name] is requesting a list of pt's current meds being faxed over to [phone]. Call back number if needed: [phone].

## 2024-01-02 ENCOUNTER — Encounter: Payer: Self-pay | Admitting: Internal Medicine

## 2024-01-02 ENCOUNTER — Ambulatory Visit (INDEPENDENT_AMBULATORY_CARE_PROVIDER_SITE_OTHER): Admitting: Internal Medicine

## 2024-01-02 VITALS — BP 138/72 | HR 88 | Ht 60.0 in | Wt 142.0 lb

## 2024-01-02 DIAGNOSIS — I10 Essential (primary) hypertension: Secondary | ICD-10-CM

## 2024-01-02 DIAGNOSIS — M51362 Other intervertebral disc degeneration, lumbar region with discogenic back pain and lower extremity pain: Secondary | ICD-10-CM | POA: Insufficient documentation

## 2024-01-02 DIAGNOSIS — D3A098 Benign carcinoid tumors of other sites: Secondary | ICD-10-CM | POA: Diagnosis not present

## 2024-01-02 DIAGNOSIS — M21612 Bunion of left foot: Secondary | ICD-10-CM | POA: Diagnosis not present

## 2024-01-02 DIAGNOSIS — Z1159 Encounter for screening for other viral diseases: Secondary | ICD-10-CM

## 2024-01-02 DIAGNOSIS — E1169 Type 2 diabetes mellitus with other specified complication: Secondary | ICD-10-CM

## 2024-01-02 DIAGNOSIS — K529 Noninfective gastroenteritis and colitis, unspecified: Secondary | ICD-10-CM

## 2024-01-02 DIAGNOSIS — E1140 Type 2 diabetes mellitus with diabetic neuropathy, unspecified: Secondary | ICD-10-CM | POA: Diagnosis not present

## 2024-01-02 DIAGNOSIS — Z7984 Long term (current) use of oral hypoglycemic drugs: Secondary | ICD-10-CM | POA: Diagnosis not present

## 2024-01-02 MED ORDER — PREGABALIN 25 MG PO CAPS: 25.0000 mg | ORAL_CAPSULE | Freq: Two times a day (BID) | ORAL | 3 refills | Status: AC

## 2024-01-02 NOTE — Assessment & Plan Note (Addendum)
 Lab Results  Component Value Date   HGBA1C 8.6 (H) 09/06/2023   Uncontrolled due to diet/medication noncompliance in the past On glipizide  10 mg QD for now Advised to follow diabetic diet On statin and ACEi F/u CMP, HbA1c and lipid panel, needs updated labs Diabetic eye exam: Advised to follow up with Ophthalmology for diabetic eye exam

## 2024-01-02 NOTE — Patient Instructions (Addendum)
 Please continue taking Lyrica  25 mg twice daily.  Please take Tylenol  arthritis as needed for back pain.  Please continue to take medications as prescribed.  Please continue to follow low carb diet and ambulate as tolerated.

## 2024-01-02 NOTE — Assessment & Plan Note (Signed)
 Has diabetic neuropathy and hammertoes, likely leading to gait disturbance and leg weakness Will refer to podiatry Has tried gabapentin and Cymbalta in the past Started Lyrica  25 mg BID again

## 2024-01-02 NOTE — Assessment & Plan Note (Signed)
 S/p resection of the duodenum in 2014.  Margins were negative Used to follow up with oncology in [place], advised to contact them for follow-up

## 2024-01-02 NOTE — Assessment & Plan Note (Signed)
 Has left foot pain, likely due to bunion Will refer to podiatry

## 2024-01-02 NOTE — Progress Notes (Signed)
 Established Patient Office Visit  Subjective:  Patient ID: Tiffany Ortiz, female    DOB: [birth year]  Age: 78 y.o. MRN: [id number]  CC:  Chief Complaint  Patient presents with   Hypertension    Four week follow up   Diabetes    Four week follow up   xray    Discuss xrays    HPI Tiffany Ortiz is a 78 y.o. female with past medical history of HTN, type II DM, HLD, OSA, GERD, MDD and GAD who presents for f/u of her chronic medical conditions. She used to see Dr. Melvenia.  Her daughter is present during the visit today.  HTN: Her BP is significantly better today compared to previous visit.  She takes amlodipine  5 mg QD, lisinopril  10 mg QD and chlorthalidone 25 mg QD. Denies chest pain, dyspnea or palpitations currently.  Type II DM: Her last HbA1c was 8.6 in 05/25.  She takes glipizide  10 mg QD. She has noticed hyperglycemia, around 180s at home, but has been better recently.  She does not check blood glucose regularly.  She also has history of diabetic neuropathy, and requests podiatry referral.  She also has bunion, has severe pain of the left foot.  She has tried gabapentin and Cymbalta in the past, but did not tolerate them.  She was given Lyrica  in the last visit, but still has not completed it -likely due to noncompliance.  Recurrent falls: She reports recurrent falls, but denies any prodromal symptoms.  She has chronic leg weakness, likely due to diabetic neuropathy and has gait disturbance.  She had 2 falls in the last month, one was related to breaking of the armrest of the seat and the other was related to getting startled by a spider.  She has a history of chronic constipation, takes Linzess  every other day currently.  She takes fibermucil as well.  Denies melena or hematochezia currently.  She also reports episodes of diarrhea at times.  She is followed by GI, was recently given metronidazole  considering SIBO, but has not started taking it yet.  She takes hyoscyamine  as  needed for abdominal cramps.  Her medication list also shows Creon , which she does not have.  She has history of carcinoid tumor of intestine, used to be followed by oncology in [place], but has lost follow-up.  GAD: She takes Zoloft  100 mg QD.  She also has clonazepam , takes 0.25 mg twice daily as needed for severe anxiety and insomnia.  She still has difficulty maintaining sleep at nighttime, but reports daytime naps while watching TV.  Low back pain: She reports recent worsening of her chronic low back pain for the last 2 weeks.  She went to urgent care, had x-ray of lumbar spine, which showed chronic degenerative changes.  Her pain is constant, sharp, radiating to bilateral buttock area, associated with bilateral leg weakness and worse with prolonged sitting.  She has tried taking Tylenol  with mild relief.  She was given tizanidine  from urgent care, which helps with muscle spasms.  Past Medical History:  Diagnosis Date   Allergy    Anxiety    Arthritis    Cancer (HCC)    carcinoid tumor stomach   Cataract    REMOVED BILATERAL   Depression    Diabetes mellitus    Fibromyalgia    GERD (gastroesophageal reflux disease)    Headache(784.0)    Hyperlipidemia    Hyperplastic colon polyp 02/04/2013   Hypertension    IBS (irritable bowel syndrome)  Osteoporosis    Polyp of rectum 02/04/2013   Renal disorder    Shortness of breath    Sleep apnea    Vitamin D deficiency     Past Surgical History:  Procedure Laterality Date   ABDOMINAL HYSTERECTOMY     Cervical cancer, with incidental appendectomy   APPENDECTOMY     BIOPSY N/A 02/04/2013   Procedure: BIOPSY;  Surgeon: Lamar CHRISTELLA Hollingshead, MD;  [facility]: [facility];  Service: Endoscopy;  Laterality: N/A;  SB BX AND RANDOM COLON BX   bladder tack     BREAST LUMPECTOMY     benign   COLONOSCOPY  09/22/2003   RMR: Diminutive rectal polyps cold biopsy/removed.  Otherwise normal rectal mucosa/Pancolonic diverticula.  The remainder of  the colonic mucosa appeared normal   COLONOSCOPY WITH ESOPHAGOGASTRODUODENOSCOPY (EGD) N/A 02/04/2013   Procedure: COLONOSCOPY WITH ESOPHAGOGASTRODUODENOSCOPY (EGD);  Surgeon: Lamar CHRISTELLA Hollingshead, MD;  Location: [facility];  Service: Endoscopy;  Laterality: N/A;  10:15-moved to 9:30am   ESOPHAGOGASTRODUODENOSCOPY  11/19/2006   MFM:Emnfpwzwu Schatzki's ring with overlying distal esophageal erosions consistent with erosive reflux esophagitis, status post dilation and disruption of ring as described above/ Small hiatal hernia/Otherwise normal stomach, first duodenum and second duodenum   EUS N/A 02/12/2013   Procedure: UPPER ENDOSCOPIC ULTRASOUND (EUS) LINEAR;  Surgeon: Toribio SHAUNNA Cedar, MD;  Location: [facility];  Service: Endoscopy;  Laterality: N/A;   EUS N/A 08/20/2013   Procedure: UPPER ENDOSCOPIC ULTRASOUND (EUS) LINEAR;  Surgeon: Toribio SHAUNNA Cedar, MD;  Location: [facility];  Service: Endoscopy;  Laterality: N/A;   LAPAROSCOPIC SMALL BOWEL RESECTION N/A 03/31/2013   Procedure: LAPAROSCOPIC  ASSISTED CONVERTED TO OPEN RESECTION DUODENAL MASS/UPPER ENDOSCOPY;  Surgeon: Jina Nephew, MD;  Location: [facility];  Service: General;  Laterality: N/A;   TONSILLECTOMY      Family History  Problem Relation Age of Onset   Cancer Mother        ?ovarian   Cancer Father    Colon cancer Neg Hx    Colon polyps Neg Hx    Esophageal cancer Neg Hx    Rectal cancer Neg Hx    Stomach cancer Neg Hx    Pancreatic cancer Neg Hx     Social History   Socioeconomic History   Marital status: Married    Spouse name: Not on file   Number of children: 2   Years of education: Not on file   Highest education level: Not on file  Occupational History   Occupation: retired  Tobacco Use   Smoking status: Former    Types: Cigarettes   Smokeless tobacco: Former    Quit date: 02/11/1970  Vaping Use   Vaping status: Never Used  Substance and Sexual Activity   Alcohol  use: No   Drug use: No   Sexual activity: Not on  file  Other Topics Concern   Not on file  Social History Narrative   Married, to [employer]   #2 grown children   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   Social Drivers of Health   Financial Resource Strain: High Risk (02/20/2023)   Overall Financial Resource Strain (CARDIA)    Difficulty of Paying Living Expenses: Hard  Food Insecurity: No Food Insecurity (02/20/2023)   Hunger Vital Sign    Worried About Running Out of Food in the Last Year: Never true    Ran Out of Food in the Last Year: Never true  Transportation Needs: No Transportation  Needs (02/20/2023)   PRAPARE - Administrator, Civil Service (Medical): No    Lack of Transportation (Non-Medical): No  Physical Activity: Sufficiently Active (02/20/2023)   Exercise Vital Sign    Days of Exercise per Week: 7 days    Minutes of Exercise per Session: 30 min  Stress: No Stress Concern Present (02/20/2023)   [employer] of Occupational Health - Occupational Stress Questionnaire    Feeling of Stress : Not at all  Social Connections: Moderately Integrated (02/20/2023)   Social Connection and Isolation Panel    Frequency of Communication with Friends and Family: More than three times a week    Frequency of Social Gatherings with Friends and Family: More than three times a week    Attends Religious Services: More than 4 times per year    Active Member of [employer] or Organizations: No    Attends Banker Meetings: Never    Marital Status: Married  Catering manager Violence: Not At Risk (02/20/2023)   Humiliation, Afraid, Rape, and Kick questionnaire    Fear of Current or Ex-Partner: No    Emotionally Abused: No    Physically Abused: No    Sexually Abused: No    Outpatient Medications Prior to Visit  Medication Sig Dispense Refill   acetaminophen  (TYLENOL  8 HOUR) 650 MG CR tablet Take 1 tablet (650 mg total) by mouth every 8 (eight) hours as needed for pain. 30  tablet 0   amLODipine  (NORVASC ) 5 MG tablet Take 1 tablet (5 mg total) by mouth daily. 90 tablet 3   b complex vitamins capsule Take 1 capsule by mouth daily.     Blood Glucose Monitoring Suppl DEVI 1 each by Does not apply route in the morning, at noon, and at bedtime. May substitute to any manufacturer covered by patient's insurance. DX e11.65 1 each 0   chlorthalidone (HYGROTON) 25 MG tablet TAKE ONE TABLET BY MOUTH ONCE DAILY. 90 tablet 3   Cholecalciferol 125 MCG (5000 UT) TABS Take 1 capsule by mouth daily.     clonazePAM  (KLONOPIN ) 0.5 MG tablet TAKE 1 TABLET DAILY AS NEEDED FOR ANXIETY 30 tablet 1   Coenzyme Q10 (CO Q-10 PO) Take 1 tablet by mouth daily.     fexofenadine (ALLEGRA) 30 MG tablet Take 30 mg by mouth daily.      glipiZIDE  (GLUCOTROL  XL) 10 MG 24 hr tablet Take 1 tablet (10 mg total) by mouth daily with breakfast. 30 tablet 2   Glucose Blood (BLOOD GLUCOSE TEST STRIPS) STRP 1 each by In Vitro route in the morning, at noon, and at bedtime. May substitute to any manufacturer covered by patient's insurance. 100 strip 0   hyoscyamine  (LEVBID ) 0.375 MG 12 hr tablet TAKE 2 TABLETS BY MOUTH AS NEEDED DAILY. 60 tablet 3   Lancets Misc. MISC 1 each by Does not apply route in the morning, at noon, and at bedtime. May substitute to any manufacturer covered by patient's insurance. 100 each 0   LINZESS  72 MCG capsule TAKE ONE CAPSULE BY MOUTH ONCE DAILY. TAKE 30 MINUTES BEFORE BREAKFAST (Patient taking differently: daily as needed.) 30 capsule 2   lipase/protease/amylase (CREON ) 12000-38000 units CPEP capsule Take 2 capsules daily with each meal 540 capsule 0   lisinopril  (ZESTRIL ) 10 MG tablet Take 1 tablet (10 mg total) by mouth daily. 90 tablet 3   metroNIDAZOLE  (FLAGYL ) 250 MG tablet Take 1 tablet (250 mg total) by mouth 3 (three) times daily for 10 days. 30  tablet 0   Multiple Vitamin (MULTIVITAMIN) tablet Take 1 tablet by mouth daily. With Vit D     Omega-3 Fatty Acids (OMEGA 3 PO)  Take 1 capsule by mouth daily. With Vit D     omeprazole  (PRILOSEC) 40 MG capsule Take 40 mg by mouth daily.     oxymetazoline  (AFRIN) 0.05 % nasal spray Place 1 spray into both nostrils 2 (two) times daily as needed for congestion.     potassium chloride  SA (KLOR-CON  M) 20 MEQ tablet Take 1 tablet (20 mEq total) by mouth 2 (two) times daily for 7 days. 14 tablet 0   pravastatin  (PRAVACHOL ) 40 MG tablet Take 1 tablet (40 mg total) by mouth daily. 90 tablet 3   Probiotic Product (PROBIOTIC PO) Take 1 tablet by mouth daily.     Propylene Glycol-Glycerin (SOOTHE OP) Place 1 drop into both eyes 2 (two) times daily as needed.      RESTASIS 0.05 % ophthalmic emulsion Place into both eyes 2 (two) times daily.      sertraline  (ZOLOFT ) 100 MG tablet Take 1 tablet (100 mg total) by mouth daily. 90 tablet 1   tiZANidine  (ZANAFLEX ) 2 MG tablet Take 1 tablet (2 mg total) by mouth every 8 (eight) hours as needed for muscle spasms. 20 tablet 0   UNABLE TO FIND Take 1 capsule by mouth daily. Med Name: Phytoceramides     UNABLE TO FIND Take 1 capsule by mouth daily. Med Name: CinnaChroma     UNABLE TO FIND Take 2 capsules by mouth daily. Med Name: PC Liver and Brain     valACYclovir  (VALTREX ) 500 MG tablet Take 1 tablet (500 mg total) by mouth as needed. Reported on 10/31/2015 90 tablet 1   Lancet Device MISC 1 each by Does not apply route in the morning, at noon, and at bedtime. May substitute to any manufacturer covered by patient's insurance. 1 each 0   pregabalin  (LYRICA ) 25 MG capsule Take 1 capsule (25 mg total) by mouth 2 (two) times daily. 60 capsule 0   No facility-administered medications prior to visit.    Allergies  Allergen Reactions   Other     Metal alloy-breaks her out-esp. Metal surgical instruments or some metal implanted in me   Dorethia [Aspirin]     Kidney doctor told her to not take ASA   Erythromycin     Doesn't remember    Famciclovir     migrane   Hydrocodone     makes me lose my  mind-hallucinations   Ibuprofen     Kidney doctor told her to not take ibuprofen.   Lactose Intolerance (Gi) Diarrhea and Other (See Comments)    Severe diarrhea   Morphine  And Codeine Other (See Comments)    Pt says Morphine  makes her go out of her head   Oxycodone     hallucinations   Promethazine Nausea And Vomiting   Tilactase Diarrhea   Tylenol  [Acetaminophen ]     Knocks her out.    ROS Review of Systems  Constitutional:  Positive for fatigue. Negative for chills and fever.  HENT:  Negative for congestion and sore throat.   Eyes:  Negative for pain and discharge.  Respiratory:  Negative for cough and shortness of breath.   Cardiovascular:  Negative for chest pain and palpitations.  Gastrointestinal:  Positive for constipation. Negative for abdominal pain, diarrhea, nausea and vomiting.  Endocrine: Negative for polydipsia and polyuria.  Genitourinary:  Negative for dysuria and hematuria.  Musculoskeletal:  Positive for arthralgias, back pain, gait problem and neck pain. Negative for neck stiffness.  Skin:  Negative for rash.  Neurological:  Positive for weakness (Bilateral legs). Negative for dizziness.  Psychiatric/Behavioral:  Positive for sleep disturbance. Negative for agitation and behavioral problems. The patient is nervous/anxious.       Objective:    Physical Exam Vitals reviewed.  Constitutional:      General: She is not in acute distress.    Appearance: She is not diaphoretic.     Comments: Has a cane  HENT:     Head: Normocephalic and atraumatic.     Nose: Nose normal. No congestion.     Mouth/Throat:     Mouth: Mucous membranes are moist.     Pharynx: No posterior oropharyngeal erythema.  Eyes:     General: No scleral icterus.    Extraocular Movements: Extraocular movements intact.  Cardiovascular:     Rate and Rhythm: Normal rate and regular rhythm.     Heart sounds: Normal heart sounds. No murmur heard. Pulmonary:     Breath sounds: Normal  breath sounds. No wheezing or rales.  Musculoskeletal:     Cervical back: Neck supple. No tenderness.     Lumbar back: Tenderness present. Decreased range of motion.     Right lower leg: No edema.     Left lower leg: No edema.  Feet:     Comments: Hammertoes bilaterally Skin:    General: Skin is warm.     Findings: No rash.  Neurological:     General: No focal deficit present.     Mental Status: She is alert and oriented to person, place, and time.     Sensory: Sensory deficit (Bilateral feet) present.     Motor: Weakness (Bilateral LE - 3/5) present.     Gait: Gait abnormal.  Psychiatric:        Mood and Affect: Mood normal.        Behavior: Behavior normal.     BP 138/72 (BP Location: Left Arm)   Pulse 88   Ht 5' (1.524 m)   Wt 142 lb (64.4 kg)   SpO2 98%   BMI 27.73 kg/m  Wt Readings from Last 3 Encounters:  01/02/24 142 lb (64.4 kg)  12/16/23 144 lb (65.3 kg)  11/14/23 148 lb (67.1 kg)    Lab Results  Component Value Date   TSH 0.69 02/25/2023   Lab Results  Component Value Date   WBC 9.1 04/08/2023   HGB 14.4 04/08/2023   HCT 42.3 04/08/2023   MCV 88 04/08/2023   PLT 359 04/08/2023   Lab Results  Component Value Date   NA 144 04/08/2023   K 4.3 04/08/2023   CO2 22 04/08/2023   GLUCOSE 111 (H) 04/08/2023   BUN 22 04/08/2023   CREATININE 0.85 04/08/2023   BILITOT 0.3 02/25/2023   ALKPHOS 84 02/25/2023   AST 22 02/25/2023   ALT 27 02/25/2023   PROT 7.1 02/25/2023   ALBUMIN 3.9 02/25/2023   CALCIUM 10.0 04/08/2023   EGFR 71 04/08/2023   GFR 57.20 (L) 02/25/2023   Lab Results  Component Value Date   CHOL 228 (H) 04/08/2023   Lab Results  Component Value Date   HDL 37 (L) 04/08/2023   Lab Results  Component Value Date   LDLCALC 128 (H) 04/08/2023   Lab Results  Component Value Date   TRIG 352 (H) 04/08/2023   Lab Results  Component Value Date   CHOLHDL  6.2 (H) 04/08/2023   Lab Results  Component Value Date   HGBA1C 8.6 (H)  09/06/2023      Assessment & Plan:   Problem List Items Addressed This Visit       Cardiovascular and Mediastinum   Essential hypertension - Primary   BP Readings from Last 1 Encounters:  01/02/24 138/72   Usually Well-controlled with amlodipine  5 mg QD, lisinopril  10 mg QD and chlorthalidone 25 mg QD Counseled for compliance with the medications Advised DASH diet and ambulatory as tolerated        Digestive   Chronic diarrhea   Has intermittent constipation and diarrhea Followed by GI Was recently given metronidazole  for SIBO, but needs to start it Chart review also suggests hyoscyamine  and Creon , needs to contact GI for discussing them      Carcinoid tumor of intestine   S/p resection of the duodenum in 2014.  Margins were negative Used to follow up with oncology in [place], advised to contact them for follow-up        Endocrine   Type 2 diabetes mellitus with other specified complication [facility])   Lab Results  Component Value Date   HGBA1C 8.6 (H) 09/06/2023   Uncontrolled due to diet/medication noncompliance in the past On glipizide  10 mg QD for now Advised to follow diabetic diet On statin and ACEi F/u CMP, HbA1c and lipid panel, needs updated labs Diabetic eye exam: Advised to follow up with Ophthalmology for diabetic eye exam      Type 2 diabetes mellitus with diabetic neuropathy, without long-term current use of insulin  (HCC)   Has diabetic neuropathy and hammertoes, likely leading to gait disturbance and leg weakness Will refer to podiatry Has tried gabapentin and Cymbalta in the past Started Lyrica  25 mg BID again      Relevant Medications   pregabalin  (LYRICA ) 25 MG capsule   Other Relevant Orders   Ambulatory referral to Podiatry     Musculoskeletal and Integument   Bunion of left foot   Has left foot pain, likely due to bunion Will refer to podiatry      Relevant Orders   Ambulatory referral to Podiatry   Degeneration of intervertebral  disc of lumbar region with discogenic back pain and lower extremity pain   Has acute on chronic low back pain Advised to take Tylenol  arthritis as needed for mild to moderate pain Tizanidine  as needed for muscle spasms Lyrica  can also help with neuropathic pain If persistent pain, can consider oral NSAID for short-term Avoid heavy lifting and frequent bending      Other Visit Diagnoses       Need for hepatitis C screening test       Relevant Orders   Hepatitis C Antibody        Meds ordered this encounter  Medications   pregabalin  (LYRICA ) 25 MG capsule    Sig: Take 1 capsule (25 mg total) by mouth 2 (two) times daily.    Dispense:  60 capsule    Refill:  3    Follow-up: Return in about 3 months (around 04/03/2024) for HTN and DM.    Suzzane MARLA Blanch, MD

## 2024-01-02 NOTE — Assessment & Plan Note (Addendum)
 Has acute on chronic low back pain Advised to take Tylenol  arthritis as needed for mild to moderate pain Tizanidine  as needed for muscle spasms Lyrica  can also help with neuropathic pain If persistent pain, can consider oral NSAID for short-term Avoid heavy lifting and frequent bending

## 2024-01-02 NOTE — Assessment & Plan Note (Signed)
 BP Readings from Last 1 Encounters:  01/02/24 138/72   Usually Well-controlled with amlodipine  5 mg QD, lisinopril  10 mg QD and chlorthalidone 25 mg QD Counseled for compliance with the medications Advised DASH diet and ambulatory as tolerated

## 2024-01-02 NOTE — Assessment & Plan Note (Signed)
 Has intermittent constipation and diarrhea Followed by GI Was recently given metronidazole  for SIBO, but needs to start it Chart review also suggests hyoscyamine  and Creon , needs to contact GI for discussing them

## 2024-01-08 ENCOUNTER — Other Ambulatory Visit: Payer: Self-pay | Admitting: Internal Medicine

## 2024-01-08 NOTE — Telephone Encounter (Unsigned)
 Copied from CRM #[id number]. Topic: Clinical - Request for Lab/Test Order >> Jan 07, 2024  5:08 PM Delon DASEN wrote: Reason for CRM: Simon with [facility] is refaxing orders, please respond with 24 hours

## 2024-01-08 NOTE — Telephone Encounter (Signed)
 Noted

## 2024-01-09 NOTE — Telephone Encounter (Signed)
 Simon with [facility] calling back  Phone #  [phone] if you have not yet received this fax, please fax to  Fax #  [phone]

## 2024-01-10 ENCOUNTER — Telehealth: Payer: Self-pay

## 2024-01-10 NOTE — Telephone Encounter (Signed)
 Copied from CRM [id number]. Topic: General - Other >> Jan 10, 2024 11:54 AM Willma SAUNDERS wrote: Reason for CRM: Simon from [facility] calling, states he was told yesterday the clinical lab form would be faxed to them and he has not received it. Please fax to [phone]  Ray can be reached at [phone]

## 2024-01-13 ENCOUNTER — Ambulatory Visit (INDEPENDENT_AMBULATORY_CARE_PROVIDER_SITE_OTHER)

## 2024-01-13 ENCOUNTER — Encounter: Payer: Self-pay | Admitting: Podiatry

## 2024-01-13 ENCOUNTER — Ambulatory Visit (INDEPENDENT_AMBULATORY_CARE_PROVIDER_SITE_OTHER): Admitting: Podiatry

## 2024-01-13 DIAGNOSIS — M2011 Hallux valgus (acquired), right foot: Secondary | ICD-10-CM

## 2024-01-13 DIAGNOSIS — E1140 Type 2 diabetes mellitus with diabetic neuropathy, unspecified: Secondary | ICD-10-CM

## 2024-01-13 DIAGNOSIS — M2012 Hallux valgus (acquired), left foot: Secondary | ICD-10-CM

## 2024-01-13 NOTE — Telephone Encounter (Unsigned)
 Copied from CRM [id number]. Topic: General - Other >> Jan 10, 2024 11:54 AM Willma SAUNDERS wrote: Reason for CRM: Simon from [facility] calling, states he was told yesterday the clinical lab form would be faxed to them and he has not received it. Please fax to [phone]  Ray can be reached at [phone]

## 2024-01-13 NOTE — Telephone Encounter (Signed)
 Per pt she did not order this test , per Dr. Tobie form cannot be completed at this time. Please refer to other TE

## 2024-01-13 NOTE — Progress Notes (Signed)
 Subjective:  Patient ID: Tiffany Ortiz, female    DOB: 02-Jun-1945,   MRN: [id number]  Chief Complaint  Patient presents with   Bunions    78 y.o. female presents for concern of bunion on left foot that has been present for years. Relates it has been difficult to find shoes that fit. She has tried wider shoes.  Relates burning and tingling in their feet. She has a history of neuropathy and has tried gabapentin and Cymbalta in the past and did not tolerate. She has been prescribed lyrica  but sounds as  she has not been taking. Patient is diabetic and last A1c was  Lab Results  Component Value Date   HGBA1C 8.6 (H) 09/06/2023   .   PCP:  Tobie Suzzane POUR, MD     . Denies any other pedal complaints. Denies n/v/f/c.   Past Medical History:  Diagnosis Date   Allergy    Anxiety    Arthritis    Cancer (HCC)    carcinoid tumor stomach   Cataract    REMOVED BILATERAL   Depression    Diabetes mellitus    Fibromyalgia    GERD (gastroesophageal reflux disease)    Headache(784.0)    Hyperlipidemia    Hyperplastic colon polyp 02/04/2013   Hypertension    IBS (irritable bowel syndrome)    Osteoporosis    Polyp of rectum 02/04/2013   Renal disorder    Shortness of breath    Sleep apnea    Vitamin D deficiency     Objective:  Physical Exam: Vascular: DP/PT pulses 2/4 bilateral. CFT <3 seconds. Normal hair growth on digits. No edema.  Skin. No lacerations or abrasions bilateral feet.  Musculoskeletal: MMT 5/5 bilateral lower extremities in DF, PF, Inversion and Eversion. Deceased ROM in DF of ankle joint. HAV deformity noted on the left and the right. More so on the left. Hammered digitr 2-3 bilateral. Mild tenderness to medial eminence bilateral.  Some pain with Rom of the first MPJ bilateral. Spurring noted over dorsum of midfoot on right.  Neurological: Sensation intact to light touch.   Assessment:   1. Hallux valgus of left foot   2. Type 2 diabetes mellitus with diabetic  neuropathy, without long-term current use of insulin  (HCC)      Plan:  Patient was evaluated and treated and all questions answered. -Xrays reviewed. No acute fractures of dislocations noted. HAV deformity noted on left with IM 1-2 angle of 15 and sesamoid position of 6. Adbudction of digits 1-3 with hammering of the second and third digits. Degenerative changes noted at the first MPJ. HAV on the right with Im 1-2 angle of 10 degrees. Sesamoid position of 5. Increased HAV angle. Mild degenerative changes on the right first MPJ. Degenerative changes note din midfoot.  -Discussed HAV and treatment options;conservative and surgical management; risks, benefits, alternatives discussed. All patient's questions answered. -Discussed padding and wide shoe gear.   -Recommend continue with good supportive shoes and inserts.  -Discussed surgical options. Discussed currently patient A1c is too elevated to be a good surgical candidate.  Discussed neuropathy and etiology as well as treatment with patient.  Radiographs reviewed and discussed with patient.  -Discussed and educated patient on diabetic foot care, especially with  regards to the vascular, neurological and musculoskeletal systems.  -Stressed the importance of good glycemic control and the detriment of not  controlling glucose levels in relation to the foot. -Discussed supportive shoes at all times and checking feet regularly.  -  Follow-up with PCP for prescription management of Lyrica .  -Patient to return in 1 year for DM foot hceck.      Asberry Failing, DPM

## 2024-01-15 ENCOUNTER — Other Ambulatory Visit: Payer: Self-pay | Admitting: Internal Medicine

## 2024-01-15 MED ORDER — TIZANIDINE HCL 2 MG PO TABS: 2.0000 mg | ORAL_TABLET | Freq: Three times a day (TID) | ORAL | 0 refills | Status: AC | PRN

## 2024-01-15 NOTE — Telephone Encounter (Signed)
 FYI Only or Action Required?: Action required by provider: medication refill request.  Patient was last seen in primary care on 01/02/2024 by Tobie Suzzane POUR, MD.  Called Nurse Triage reporting No chief complaint on file..  Symptoms began today.  Interventions attempted: Nothing.  Symptoms are: stable.  Triage Disposition: No disposition on file.  Patient/caregiver understands and will follow disposition?:

## 2024-01-15 NOTE — Telephone Encounter (Signed)
 Copied from CRM [id number]. Topic: Clinical - Medication Refill >> Jan 15, 2024  2:45 PM Debby BROCKS wrote: Medication: tiZANidine  (ZANAFLEX ) 2 MG tablet  Has the patient contacted their pharmacy? Yes (Agent: If no, request that the patient contact the pharmacy for the refill. If patient does not wish to contact the pharmacy document the reason why and proceed with request.) (Agent: If yes, when and what did the pharmacy advise?) Yes, pharmacy stated they need to contact PCP Patient received it from an urgent care and would like to know if PCP can order more for the patient  This is the patient's preferred pharmacy:  [facility] - [place], [place] - [address] [place] [address] Phone: [phone] Fax: [phone]  Is this the correct pharmacy for this prescription? Yes If no, delete pharmacy and type the correct one.   Has the prescription been filled recently? No  Is the patient out of the medication? Yes  Has the patient been seen for an appointment in the last year OR does the patient have an upcoming appointment? Yes  Can we respond through MyChart? Yes  Agent: Please be advised that Rx refills may take up to 3 business days. We ask that you follow-up with your pharmacy.

## 2024-01-16 ENCOUNTER — Telehealth: Payer: Self-pay

## 2024-01-16 NOTE — Progress Notes (Signed)
   01/16/2024  Patient ID: Tiffany Ortiz, female   DOB: 03/03/1946, 78 y.o.   MRN: [id number]  This patient is appearing on a report for being at risk of failing the adherence measure for identified medications this calendar year.   Medication Adherence Summary (STAR/HEDIS Monitoring): Adherence Category: cholesterol (statin)    Drug Name: Pravastatin  40 mg    Notes: ? Adherence data not pulled from pharmacy claims portal Dr. Annemarie. I called [facility] and was informed patient last picked up pravastatin  on 09/18/2023 for 90 days supply with refills remaining. Patient was outreached but no answer.  ? Plan: Will collaborate with embedded pharmacist to follow up patient's fill history.  Dorcas Solian, PharmD Clinical Pharmacist Cell: [phone]

## 2024-01-29 ENCOUNTER — Other Ambulatory Visit: Payer: Self-pay | Admitting: Nurse Practitioner

## 2024-02-04 ENCOUNTER — Other Ambulatory Visit: Payer: Self-pay | Admitting: Internal Medicine

## 2024-02-04 DIAGNOSIS — F419 Anxiety disorder, unspecified: Secondary | ICD-10-CM

## 2024-02-05 ENCOUNTER — Telehealth: Payer: Self-pay

## 2024-02-05 NOTE — Telephone Encounter (Signed)
 Copied from CRM [id number]. Topic: General - Other >> Feb 05, 2024 10:19 AM Tiffany Ortiz wrote: Reason for CRM: [name] from [facility] wanted to pass on the message to Dr. Tobie: For the past several weeks they have been checking her blood sugar and blood pressure (keeping a log everyday) Its been hard to get her to remember her medication and to check blood sugars everyday. Daughter prepped a plan but its still hit or miss The patient is reporting that she gets really nervous and confused when its medication or blood sugar levels time The patient talks about having problems remembering certain thigns Forgets what her blood sugar was soon after taking it  She also takes Anti anxiety medication but theyre struggling to get her to take it  Callback: - [phone]

## 2024-02-24 ENCOUNTER — Ambulatory Visit: Payer: PPO

## 2024-02-24 ENCOUNTER — Telehealth: Payer: Self-pay

## 2024-02-24 VITALS — Ht 60.0 in | Wt 142.0 lb

## 2024-02-24 DIAGNOSIS — Z0000 Encounter for general adult medical examination without abnormal findings: Secondary | ICD-10-CM | POA: Diagnosis not present

## 2024-02-24 NOTE — Telephone Encounter (Signed)
 noted

## 2024-02-24 NOTE — Telephone Encounter (Signed)
 Copied from CRM #[id number]. Topic: Appointments - Appointment Cancel/Reschedule >> Feb 24, 2024  9:33 AM Delon DASEN wrote: Change to telephone appt, not video, patient does not have a cell phone, that is her daughters phone number.  Call home phone.

## 2024-02-24 NOTE — Progress Notes (Signed)
 Subjective:   Tiffany Ortiz is a 78 y.o. who presents for a Medicare Wellness preventive visit.  As a reminder, Annual Wellness Visits don't include a physical exam, and some assessments may be limited, especially if this visit is performed virtually. We may recommend an in-person follow-up visit with your provider if needed.  Visit Complete: Virtual I connected with  Tiffany Ortiz on 02/24/24 by a audio enabled telemedicine application and verified that I am speaking with the correct person using two identifiers.  Patient Location: Home  Provider Location: Home Office  I discussed the limitations of evaluation and management by telemedicine. The patient expressed understanding and agreed to proceed.  Vital Signs: Because this visit was a virtual/telehealth visit, some criteria may be missing or patient reported. Any vitals not documented were not able to be obtained and vitals that have been documented are patient reported.  VideoDeclined- This patient declined Librarian, academic. Therefore the visit was completed with audio only.  Persons Participating in Visit: Patient.  AWV Questionnaire: No: Patient Medicare AWV questionnaire was not completed prior to this visit.  Cardiac Risk Factors include: advanced age (>24men, >28 women);diabetes mellitus;dyslipidemia;hypertension     Objective:    Today's Vitals   02/24/24 1023  Weight: 142 lb (64.4 kg)  Height: 5' (1.524 m)   Body mass index is 27.73 kg/m.     02/24/2024   10:30 AM 02/20/2023    9:59 AM 10/05/2020    3:00 PM 08/31/2019   11:47 AM 07/11/2015    8:58 PM 03/08/2015   10:26 AM 09/27/2014    9:36 AM  Advanced Directives  Does Patient Have a Medical Advance Directive? No No Yes Yes No  No  Yes   Type of Surveyor, minerals;Living will Healthcare Power of [place];Living will   Living will   Does patient want to make changes to medical advance  directive?   No - Patient declined No - Patient declined     Copy of Healthcare Power of Attorney in Chart?   No - copy requested No - copy requested   No - copy requested   Would patient like information on creating a medical advance directive? Yes (MAU/Ambulatory/Procedural Areas - Information given) No - Patient declined   Yes - Educational materials given  Yes - Educational materials given       Data saved with a previous flowsheet row definition    Current Medications (verified) Outpatient Encounter Medications as of 02/24/2024  Medication Sig   acetaminophen  (TYLENOL  8 HOUR) 650 MG CR tablet Take 1 tablet (650 mg total) by mouth every 8 (eight) hours as needed for pain.   amLODipine  (NORVASC ) 5 MG tablet Take 1 tablet (5 mg total) by mouth daily.   b complex vitamins capsule Take 1 capsule by mouth daily.   Blood Glucose Monitoring Suppl DEVI 1 each by Does not apply route in the morning, at noon, and at bedtime. May substitute to any manufacturer covered by patient's insurance. DX e11.65   chlorthalidone (HYGROTON) 25 MG tablet TAKE ONE TABLET BY MOUTH ONCE DAILY.   Cholecalciferol 125 MCG (5000 UT) TABS Take 1 capsule by mouth daily.   clonazePAM  (KLONOPIN ) 0.5 MG tablet TAKE 1 TABLET DAILY AS NEEDED FOR ANXIETY   Coenzyme Q10 (CO Q-10 PO) Take 1 tablet by mouth daily.   fexofenadine (ALLEGRA) 30 MG tablet Take 30 mg by mouth daily.    glipiZIDE  (GLUCOTROL  XL) 10 MG  24 hr tablet Take 1 tablet (10 mg total) by mouth daily with breakfast.   hyoscyamine  (LEVBID ) 0.375 MG 12 hr tablet TAKE 2 TABLETS BY MOUTH AS NEEDED DAILY.   LINZESS  72 MCG capsule TAKE ONE CAPSULE BY MOUTH ONCE DAILY. TAKE 30 MINUTES BEFORE BREAKFAST   lipase/protease/amylase (CREON ) 12000-38000 units CPEP capsule Take 2 capsules daily with each meal   lisinopril  (ZESTRIL ) 10 MG tablet Take 1 tablet (10 mg total) by mouth daily.   Multiple Vitamin (MULTIVITAMIN) tablet Take 1 tablet by mouth daily. With Vit D   Omega-3  Fatty Acids (OMEGA 3 PO) Take 1 capsule by mouth daily. With Vit D   omeprazole  (PRILOSEC) 40 MG capsule TAKE ONE CAPSULE BY MOUTH TWICE DAILY.   oxymetazoline  (AFRIN) 0.05 % nasal spray Place 1 spray into both nostrils 2 (two) times daily as needed for congestion.   potassium chloride  SA (KLOR-CON  M) 20 MEQ tablet Take 1 tablet (20 mEq total) by mouth 2 (two) times daily for 7 days.   pravastatin  (PRAVACHOL ) 40 MG tablet Take 1 tablet (40 mg total) by mouth daily.   pregabalin  (LYRICA ) 25 MG capsule Take 1 capsule (25 mg total) by mouth 2 (two) times daily.   Probiotic Product (PROBIOTIC PO) Take 1 tablet by mouth daily.   Propylene Glycol-Glycerin (SOOTHE OP) Place 1 drop into both eyes 2 (two) times daily as needed.    RESTASIS 0.05 % ophthalmic emulsion Place into both eyes 2 (two) times daily.    sertraline  (ZOLOFT ) 100 MG tablet Take 1 tablet (100 mg total) by mouth daily.   tiZANidine  (ZANAFLEX ) 2 MG tablet Take 1 tablet (2 mg total) by mouth every 8 (eight) hours as needed for muscle spasms.   UNABLE TO FIND Take 1 capsule by mouth daily. Med Name: Phytoceramides   UNABLE TO FIND Take 1 capsule by mouth daily. Med Name: CinnaChroma   UNABLE TO FIND Take 2 capsules by mouth daily. Med Name: PC Liver and Brain   valACYclovir  (VALTREX ) 500 MG tablet Take 1 tablet (500 mg total) by mouth as needed. Reported on 10/31/2015   No facility-administered encounter medications on file as of 02/24/2024.    Allergies (verified) Other, Asa [aspirin], Erythromycin, Famciclovir, Hydrocodone, Ibuprofen, Lactose intolerance (gi), Morphine  and codeine, Oxycodone, Promethazine, Tilactase, and Tylenol  [acetaminophen ]   History: Past Medical History:  Diagnosis Date   Allergy    Anxiety    Arthritis    Cancer (HCC)    carcinoid tumor stomach   Cataract    REMOVED BILATERAL   Depression    Diabetes mellitus    Fibromyalgia    GERD (gastroesophageal reflux disease)    Headache(784.0)     Hyperlipidemia    Hyperplastic colon polyp 02/04/2013   Hypertension    IBS (irritable bowel syndrome)    Osteoporosis    Polyp of rectum 02/04/2013   Renal disorder    Shortness of breath    Sleep apnea    Vitamin D deficiency    Past Surgical History:  Procedure Laterality Date   ABDOMINAL HYSTERECTOMY     Cervical cancer, with incidental appendectomy   APPENDECTOMY     BIOPSY N/A 02/04/2013   Procedure: BIOPSY;  Surgeon: Lamar CHRISTELLA Hollingshead, MD;  Location: [facility];  Service: Endoscopy;  Laterality: N/A;  SB BX AND RANDOM COLON BX   bladder tack     BREAST LUMPECTOMY     benign   COLONOSCOPY  09/22/2003   RMR: Diminutive rectal polyps cold  biopsy/removed.  Otherwise normal rectal mucosa/Pancolonic diverticula.  The remainder of the colonic mucosa appeared normal   COLONOSCOPY WITH ESOPHAGOGASTRODUODENOSCOPY (EGD) N/A 02/04/2013   Procedure: COLONOSCOPY WITH ESOPHAGOGASTRODUODENOSCOPY (EGD);  Surgeon: Lamar CHRISTELLA Hollingshead, MD;  Location: [facility];  Service: Endoscopy;  Laterality: N/A;  10:15-moved to 9:30am   ESOPHAGOGASTRODUODENOSCOPY  11/19/2006   MFM:Emnfpwzwu Schatzki's ring with overlying distal esophageal erosions consistent with erosive reflux esophagitis, status post dilation and disruption of ring as described above/ Small hiatal hernia/Otherwise normal stomach, first duodenum and second duodenum   EUS N/A 02/12/2013   Procedure: UPPER ENDOSCOPIC ULTRASOUND (EUS) LINEAR;  Surgeon: Toribio SHAUNNA Cedar, MD;  Location: [facility];  Service: Endoscopy;  Laterality: N/A;   EUS N/A 08/20/2013   Procedure: UPPER ENDOSCOPIC ULTRASOUND (EUS) LINEAR;  Surgeon: Toribio SHAUNNA Cedar, MD;  Location: [facility];  Service: Endoscopy;  Laterality: N/A;   LAPAROSCOPIC SMALL BOWEL RESECTION N/A 03/31/2013   Procedure: LAPAROSCOPIC  ASSISTED CONVERTED TO OPEN RESECTION DUODENAL MASS/UPPER ENDOSCOPY;  Surgeon: Jina Nephew, MD;  Location: [facility];  Service: General;  Laterality: N/A;   TONSILLECTOMY      Family History  Problem Relation Age of Onset   Cancer Mother        ?ovarian   Cancer Father    Colon cancer Neg Hx    Colon polyps Neg Hx    Esophageal cancer Neg Hx    Rectal cancer Neg Hx    Stomach cancer Neg Hx    Pancreatic cancer Neg Hx    Social History   Socioeconomic History   Marital status: Married    Spouse name: Not on file   Number of children: 2   Years of education: Not on file   Highest education level: Not on file  Occupational History   Occupation: retired  Tobacco Use   Smoking status: Former    Types: Cigarettes   Smokeless tobacco: Former    Quit date: 02/11/1970  Vaping Use   Vaping status: Never Used  Substance and Sexual Activity   Alcohol  use: No   Drug use: No   Sexual activity: Not on file  Other Topics Concern   Not on file  Social History Narrative   Married, to [employer]   #2 grown children   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   Social Drivers of Health   Financial Resource Strain: Low Risk  (02/24/2024)   Overall Financial Resource Strain (CARDIA)    Difficulty of Paying Living Expenses: Not very hard  Food Insecurity: No Food Insecurity (02/24/2024)   Hunger Vital Sign    Worried About Running Out of Food in the Last Year: Never true    Ran Out of Food in the Last Year: Never true  Transportation Needs: No Transportation Needs (02/24/2024)   PRAPARE - Administrator, Civil Service (Medical): No    Lack of Transportation (Non-Medical): No  Physical Activity: Insufficiently Active (02/24/2024)   Exercise Vital Sign    Days of Exercise per Week: 3 days    Minutes of Exercise per Session: 30 min  Stress: No Stress Concern Present (02/24/2024)   [employer] of Occupational Health - Occupational Stress Questionnaire    Feeling of Stress: Only a little  Social Connections: Moderately Integrated (02/24/2024)   Social Connection and Isolation Panel    Frequency of  Communication with Friends and Family: More than three times a week    Frequency of Social Gatherings  with Friends and Family: More than three times a week    Attends Religious Services: More than 4 times per year    Active Member of Clubs or Organizations: No    Attends Banker Meetings: Never    Marital Status: Married    Tobacco Counseling Counseling given: Not Answered    Clinical Intake:  Pre-visit preparation completed: Yes  Pain : No/denies pain  Diabetes: Yes CBG done?: No Did pt. bring in CBG monitor from home?: No  Lab Results  Component Value Date   HGBA1C 8.6 (H) 09/06/2023   HGBA1C 9.9 (H) 04/08/2023   HGBA1C 7.0 (H) 03/31/2013     How often do you need to have someone help you when you read instructions, pamphlets, or other written materials from your doctor or pharmacy?: 1 - Never  Interpreter Needed?: No  Information entered by :: Charmaine Bloodgood LPN   Activities of Daily Living     02/24/2024   10:30 AM  In your present state of health, do you have any difficulty performing the following activities:  Hearing? 0  Vision? 0  Difficulty concentrating or making decisions? 0  Walking or climbing stairs? 1  Dressing or bathing? 0  Doing errands, shopping? 1  Preparing Food and eating ? N  Using the Toilet? N  In the past six months, have you accidently leaked urine? N  Do you have problems with loss of bowel control? N  Managing your Medications? N  Managing your Finances? N  Housekeeping or managing your Housekeeping? Y    Patient Care Team: Tobie Suzzane POUR, MD as PCP - General (Internal Medicine) Dr [facility], OD Joya Stabs, DPM as Consulting Physician (Podiatry) Cloretta Arley NOVAK, MD as Consulting Physician (Oncology) Henry Slough, MD as Consulting Physician (Obstetrics and Gynecology)  I have updated your Care Teams any recent Medical Services you may have received from other providers in the past  year.     Assessment:   This is a routine wellness examination for [place].  Hearing/Vision screen Hearing Screening - Comments:: Patient is able to hear conversational tones without difficulty. No issues reported.   Vision Screening - Comments:: Wears rx glasses - up to date with routine eye exams with Dr. Willma Moats    Goals Addressed             This Visit's Progress    Prevent falls   On track      Depression Screen     02/24/2024   10:28 AM 06/19/2023    2:29 PM 04/08/2023    2:37 PM 02/28/2023    2:45 PM 02/28/2023    2:15 PM 02/20/2023   10:09 AM 10/31/2022    3:22 PM  PHQ 2/9 Scores  PHQ - 2 Score 0 0 2 2 0 1 4  PHQ- 9 Score  0 14 14  7 15     Fall Risk     02/24/2024   10:29 AM 11/14/2023    8:55 AM 11/14/2023    8:48 AM 09/06/2023    1:29 PM 06/19/2023    2:29 PM  Fall Risk   Falls in the past year? 1 1 0 0 0  Number falls in past yr: 1 1 0 0 0  Injury with Fall? 0 1 0 0 0  Risk for fall due to : History of fall(s);Impaired balance/gait;Impaired mobility Other (Comment) No Fall Risks No Fall Risks No Fall Risks  Follow up Education provided;Falls prevention discussed;Falls  evaluation completed Falls evaluation completed Falls evaluation completed Falls evaluation completed;Education provided;Falls prevention discussed Falls evaluation completed    MEDICARE RISK AT HOME:  Medicare Risk at Home Any stairs in or around the home?: No If so, are there any without handrails?: No Home free of loose throw rugs in walkways, pet beds, electrical cords, etc?: Yes Adequate lighting in your home to reduce risk of falls?: Yes Life alert?: No Use of a cane, walker or w/c?: Yes Grab bars in the bathroom?: Yes Shower chair or bench in shower?: No Elevated toilet seat or a handicapped toilet?: Yes  TIMED UP AND GO:  Was the test performed?  No  Cognitive Function: 6CIT completed        02/24/2024   10:30 AM 02/20/2023   10:07 AM  6CIT Screen  What  Year? 0 points 0 points  What month? 0 points 0 points  What time? 0 points 0 points  Count back from 20 0 points 0 points  Months in reverse 0 points 0 points  Repeat phrase 4 points 0 points  Total Score 4 points 0 points    Immunizations Immunization History  Administered Date(s) Administered   Fluad Trivalent(High Dose 65+) 02/28/2023   Influenza, Quadrivalent, Recombinant, Inj, Pf 02/03/2019   Influenza-Unspecified 02/04/2018   Moderna Sars-Covid-2 Vaccination 06/18/2019, 07/20/2019   Pneumococcal Conjugate-13 08/30/2014   Tdap 07/30/2019    Screening Tests Health Maintenance  Topic Date Due   Hepatitis C Screening  Never done   Zoster Vaccines- Shingrix (1 of 2) Never done   Pneumococcal Vaccine: 50+ Years (2 of 2 - PPSV23, PCV20, or PCV21) 10/25/2014   Mammogram  09/24/2015   COVID-19 Vaccine (3 - Moderna risk series) 08/17/2019   Influenza Vaccine  12/06/2023   OPHTHALMOLOGY EXAM  02/18/2024   HEMOGLOBIN A1C  03/08/2024   Diabetic kidney evaluation - eGFR measurement  04/07/2024   Diabetic kidney evaluation - Urine ACR  04/07/2024   FOOT EXAM  01/12/2025   Medicare Annual Wellness (AWV)  02/23/2025   DTaP/Tdap/Td (2 - Td or Tdap) 07/29/2029   DEXA SCAN  Completed   Meningococcal B Vaccine  Aged Out   Colonoscopy  Discontinued    Health Maintenance Items Addressed: Vaccines Due: Flu, Pneumonia, and Shingrix; requesting records for diabetic eye exam, mammogram, and dexa   Additional Screening:  Vision Screening: Recommended annual ophthalmology exams for early detection of glaucoma and other disorders of the eye. Is the patient up to date with their annual eye exam?  Yes  Who is the provider or what is the name of the office in which the patient attends annual eye exams? Dr. Willma Moats   Dental Screening: Recommended annual dental exams for proper oral hygiene  Community Resource Referral / Chronic Care Management: CRR required this visit?  No   CCM  required this visit?  No   Plan:    I have personally reviewed and noted the following in the patient's chart:   Medical and social history Use of alcohol , tobacco or illicit drugs  Current medications and supplements including opioid prescriptions. Patient is not currently taking opioid prescriptions. Functional ability and status Nutritional status Physical activity Advanced directives List of other physicians Hospitalizations, surgeries, and ER visits in previous 12 months Vitals Screenings to include cognitive, depression, and falls Referrals and appointments  In addition, I have reviewed and discussed with patient certain preventive protocols, quality metrics, and best practice recommendations. A written personalized care plan for preventive services as  well as general preventive health recommendations were provided to patient.   Lavelle Pfeiffer Bassett, [place]   [id number]/7974   After Visit Summary: (MyChart) Due to this being a telephonic visit, the after visit summary with patients personalized plan was offered to patient via MyChart   Notes: See telephone note

## 2024-02-24 NOTE — Patient Instructions (Signed)
 [name],  Thank you for taking the time for your Medicare Wellness Visit. I appreciate your continued commitment to your health goals. Please review the care plan we discussed, and feel free to reach out if I can assist you further.  Medicare recommends these wellness visits once per year to help you and your care team stay ahead of potential health issues. These visits are designed to focus on prevention, allowing your provider to concentrate on managing your acute and chronic conditions during your regular appointments.  Please note that Annual Wellness Visits do not include a physical exam. Some assessments may be limited, especially if the visit was conducted virtually. If needed, we may recommend a separate in-person follow-up with your provider.  Ongoing Care Seeing your primary care provider every 3 to 6 months helps us  monitor your health and provide consistent, personalized care.   Referrals If a referral was made during today's visit and you haven't received any updates within two weeks, please contact the referred provider directly to check on the status.  Recommended Screenings:  Health Maintenance  Topic Date Due   Hepatitis C Screening  Never done   Zoster (Shingles) Vaccine (1 of 2) Never done   Pneumococcal Vaccine for age over 32 (2 of 2 - PPSV23, PCV20, or PCV21) 10/25/2014   Breast Cancer Screening  09/24/2015   COVID-19 Vaccine (3 - Moderna risk series) 08/17/2019   Flu Shot  12/06/2023   Eye exam for diabetics  02/18/2024   Hemoglobin A1C  03/08/2024   Yearly kidney function blood test for diabetes  04/07/2024   Yearly kidney health urinalysis for diabetes  04/07/2024   Complete foot exam   01/12/2025   Medicare Annual Wellness Visit  02/23/2025   DTaP/Tdap/Td vaccine (2 - Td or Tdap) 07/29/2029   DEXA scan (bone density measurement)  Completed   Meningitis B Vaccine  Aged Out   Colon Cancer Screening  Discontinued       02/24/2024   10:30 AM  Advanced  Directives  Does Patient Have a Medical Advance Directive? No  Would patient like information on creating a medical advance directive? Yes (MAU/Ambulatory/Procedural Areas - Information given)   Advance Care Planning is important because it: Ensures you receive medical care that aligns with your values, goals, and preferences. Provides guidance to your family and loved ones, reducing the emotional burden of decision-making during critical moments.  Information on Advanced Care Planning can be found at [place]  Secretary of [facility] Advance Health Care Directives Advance Health Care Directives ([email or web address]   Vision: Annual vision screenings are recommended for early detection of glaucoma, cataracts, and diabetic retinopathy. These exams can also reveal signs of chronic conditions such as diabetes and high blood pressure.  Dental: Annual dental screenings help detect early signs of oral cancer, gum disease, and other conditions linked to overall health, including heart disease and diabetes.  Please see the attached documents for additional preventive care recommendations.

## 2024-02-29 ENCOUNTER — Encounter (HOSPITAL_COMMUNITY): Payer: Self-pay | Admitting: Emergency Medicine

## 2024-02-29 ENCOUNTER — Emergency Department (HOSPITAL_COMMUNITY)
Admission: EM | Admit: 2024-02-29 | Discharge: 2024-03-01 | Disposition: A | Attending: Emergency Medicine | Admitting: Emergency Medicine

## 2024-02-29 ENCOUNTER — Emergency Department (HOSPITAL_COMMUNITY)

## 2024-02-29 ENCOUNTER — Other Ambulatory Visit: Payer: Self-pay

## 2024-02-29 DIAGNOSIS — M47812 Spondylosis without myelopathy or radiculopathy, cervical region: Secondary | ICD-10-CM | POA: Diagnosis not present

## 2024-02-29 DIAGNOSIS — R079 Chest pain, unspecified: Secondary | ICD-10-CM | POA: Diagnosis not present

## 2024-02-29 DIAGNOSIS — S0990XA Unspecified injury of head, initial encounter: Secondary | ICD-10-CM | POA: Diagnosis not present

## 2024-02-29 DIAGNOSIS — W01198A Fall on same level from slipping, tripping and stumbling with subsequent striking against other object, initial encounter: Secondary | ICD-10-CM | POA: Insufficient documentation

## 2024-02-29 DIAGNOSIS — R0789 Other chest pain: Secondary | ICD-10-CM | POA: Insufficient documentation

## 2024-02-29 DIAGNOSIS — M5430 Sciatica, unspecified side: Secondary | ICD-10-CM | POA: Diagnosis not present

## 2024-02-29 DIAGNOSIS — W19XXXA Unspecified fall, initial encounter: Secondary | ICD-10-CM

## 2024-02-29 DIAGNOSIS — R9389 Abnormal findings on diagnostic imaging of other specified body structures: Secondary | ICD-10-CM | POA: Diagnosis not present

## 2024-02-29 LAB — BASIC METABOLIC PANEL WITH GFR
Anion gap: 11 (ref 5–15)
BUN: 21 mg/dL (ref 8–23)
CO2: 25 mmol/L (ref 22–32)
Calcium: 9.4 mg/dL (ref 8.9–10.3)
Chloride: 104 mmol/L (ref 98–111)
Creatinine, Ser: 0.88 mg/dL (ref 0.44–1.00)
GFR, Estimated: >60 mL/min (ref >60)
Glucose, Bld: 209 mg/dL — ABNORMAL HIGH (ref 70–99)
Potassium: 3.2 mmol/L — ABNORMAL LOW (ref 3.5–5.1)
Sodium: 140 mmol/L (ref 135–145)

## 2024-02-29 LAB — CBC
HCT: 39 % (ref 36.0–46.0)
Hemoglobin: 13.3 g/dL (ref 12.0–15.0)
MCH: 30.3 pg (ref 26.0–34.0)
MCHC: 34.1 g/dL (ref 30.0–36.0)
MCV: 88.8 fL (ref 80.0–100.0)
Platelets: 302 K/uL (ref 150–400)
RBC: 4.39 MIL/uL (ref 3.87–5.11)
RDW: 13.2 % (ref 11.5–15.5)
WBC: 8.6 K/uL (ref 4.0–10.5)
nRBC: 0 % (ref 0.0–0.2)

## 2024-02-29 LAB — TROPONIN T, HIGH SENSITIVITY: Troponin T High Sensitivity: 19 ng/L (ref 0–19)

## 2024-02-29 NOTE — ED Provider Notes (Signed)
 [place] EMERGENCY DEPARTMENT AT [facility] Provider Note   CSN: [id number] Arrival date & time: 02/29/24  2142     Patient presents with: Tiffany Ortiz is a 78 y.o. female.    Fall Pertinent negatives include no chest pain, no abdominal pain and no shortness of breath.  Patient presents because of fall.  Patient states that she got up from the commode today and she was try to pull up her pants whenever she had her left leg go out from underneath her.  She subsequent fell backwards and hit her head.  Did not lose consciousness.  Denies any anticoagulation.  No cervical thoracic or lumbar pain that is new for her.  Patient denies any vision changes.  Patient states that she has chronic balance issues and she has been followed up with physical therapy.  Has chronic back pain but no acute new back pain.  No new numbness or tingling aware.  No saddle anesthesia.  No bowel or bladder incontinence.  States that she did have episode of slight chest pain today.  Was tired during this episode of chest discomfort.  No history of DVT or PE.  Denies any history of ACS pathology.  No exertional chest pain or shortness of breath.   Previous medical history reviewed : Patient last seen in the ED in August 2025.  Was seen because of back pain at that time.  Pain left sacroiliac area.     Prior to Admission medications   Medication Sig Start Date End Date Taking? Authorizing Provider  acetaminophen  (TYLENOL  8 HOUR) 650 MG CR tablet Take 1 tablet (650 mg total) by mouth every 8 (eight) hours as needed for pain. 12/25/23   Leath-Warren, Etta PARAS, NP  amLODipine  (NORVASC ) 5 MG tablet Take 1 tablet (5 mg total) by mouth daily. 09/06/23   Dixon, Phillip E, MD  b complex vitamins capsule Take 1 capsule by mouth daily. 10/27/13   [provider]  Blood Glucose Monitoring Suppl DEVI 1 each by Does not apply route in the morning, at noon, and at bedtime. May substitute to any  manufacturer covered by patient's insurance. DX e11.65 12/11/23   Patel, Rutwik K, MD  chlorthalidone (HYGROTON) 25 MG tablet TAKE ONE TABLET BY MOUTH ONCE DAILY. 06/17/23   Melvenia Manus BRAVO, MD  Cholecalciferol 125 MCG (5000 UT) TABS Take 1 capsule by mouth daily. 10/13/13   [provider]  clonazePAM  (KLONOPIN ) 0.5 MG tablet TAKE 1 TABLET DAILY AS NEEDED FOR ANXIETY 02/04/24   Patel, Rutwik K, MD  Coenzyme Q10 (CO Q-10 PO) Take 1 tablet by mouth daily.    [provider]  fexofenadine (ALLEGRA) 30 MG tablet Take 30 mg by mouth daily.     [provider]  glipiZIDE  (GLUCOTROL  XL) 10 MG 24 hr tablet Take 1 tablet (10 mg total) by mouth daily with breakfast. 11/21/23   Tobie Suzzane POUR, MD  hyoscyamine  (LEVBID ) 0.375 MG 12 hr tablet TAKE 2 TABLETS BY MOUTH AS NEEDED DAILY. 05/15/23   Melvenia Manus BRAVO, MD  LINZESS  72 MCG capsule TAKE ONE CAPSULE BY MOUTH ONCE DAILY. TAKE 30 MINUTES BEFORE BREAKFAST 01/30/24   Kennedy-Smith, Colleen M, NP  lipase/protease/amylase (CREON ) 12000-38000 units CPEP capsule Take 2 capsules daily with each meal 04/08/23   Melvenia Manus BRAVO, MD  lisinopril  (ZESTRIL ) 10 MG tablet Take 1 tablet (10 mg total) by mouth daily. 09/06/23   Melvenia Manus BRAVO, MD  Multiple Vitamin (MULTIVITAMIN) tablet  Take 1 tablet by mouth daily. With Vit D    [provider]  Omega-3 Fatty Acids (OMEGA 3 PO) Take 1 capsule by mouth daily. With Vit D    [provider]  omeprazole  (PRILOSEC) 40 MG capsule TAKE ONE CAPSULE BY MOUTH TWICE DAILY. 01/09/24   Tobie Suzzane POUR, MD  oxymetazoline  (AFRIN) 0.05 % nasal spray Place 1 spray into both nostrils 2 (two) times daily as needed for congestion.    [provider]  potassium chloride  SA (KLOR-CON  M) 20 MEQ tablet Take 1 tablet (20 mEq total) by mouth 2 (two) times daily for 7 days. 07/25/22 02/24/24  Kennedy-Smith, Colleen M, NP  pravastatin  (PRAVACHOL ) 40 MG tablet Take 1 tablet (40 mg total) by mouth daily. 04/09/23    Melvenia Manus BRAVO, MD  pregabalin  (LYRICA ) 25 MG capsule Take 1 capsule (25 mg total) by mouth 2 (two) times daily. 01/02/24   Tobie Suzzane POUR, MD  Probiotic Product (PROBIOTIC PO) Take 1 tablet by mouth daily.    [provider]  Propylene Glycol-Glycerin (SOOTHE OP) Place 1 drop into both eyes 2 (two) times daily as needed.     [provider]  RESTASIS 0.05 % ophthalmic emulsion Place into both eyes 2 (two) times daily.  11/24/18   [provider]  sertraline  (ZOLOFT ) 100 MG tablet Take 1 tablet (100 mg total) by mouth daily. 11/14/23   Tobie Suzzane POUR, MD  tiZANidine  (ZANAFLEX ) 2 MG tablet Take 1 tablet (2 mg total) by mouth every 8 (eight) hours as needed for muscle spasms. 01/15/24   Tobie Suzzane POUR, MD  UNABLE TO FIND Take 1 capsule by mouth daily. Med Name: Phytoceramides    [provider]  UNABLE TO FIND Take 1 capsule by mouth daily. Med Name: CinnaChroma    [provider]  UNABLE TO FIND Take 2 capsules by mouth daily. Med Name: PC Liver and Brain    [provider]  valACYclovir  (VALTREX ) 500 MG tablet Take 1 tablet (500 mg total) by mouth as needed. Reported on 10/31/2015 04/08/23   Melvenia Manus BRAVO, MD    Allergies: Other, Asa [aspirin], Erythromycin, Famciclovir, Hydrocodone, Ibuprofen, Lactose intolerance (gi), Morphine  and codeine, Oxycodone, Promethazine, Tilactase, and Tylenol  [acetaminophen ]    Review of Systems  Constitutional:  Negative for chills and fever.  HENT:  Negative for ear pain and sore throat.   Eyes:  Negative for pain and visual disturbance.  Respiratory:  Negative for cough and shortness of breath.   Cardiovascular:  Negative for chest pain and palpitations.  Gastrointestinal:  Negative for abdominal pain and vomiting.  Genitourinary:  Negative for dysuria and hematuria.  Musculoskeletal:  Negative for arthralgias and back pain.  Skin:  Negative for color change and rash.  Neurological:  Negative for  seizures and syncope.  All other systems reviewed and are negative.   Updated Vital Signs BP (!) 140/65 (BP Location: Left Arm)   Pulse 75   Temp 98.2 F (36.8 C) (Oral)   Resp 18   Ht 5' (1.524 m)   Wt 64 kg   SpO2 97%   BMI 27.56 kg/m   Physical Exam Vitals and nursing note reviewed.  Constitutional:      General: She is not in acute distress.    Appearance: She is well-developed.  HENT:     Head: Normocephalic and atraumatic.  Eyes:     Conjunctiva/sclera: Conjunctivae normal.  Cardiovascular:     Rate and Rhythm: Normal rate  and regular rhythm.     Heart sounds: No murmur heard. Pulmonary:     Effort: Pulmonary effort is normal. No respiratory distress.     Breath sounds: Normal breath sounds.  Abdominal:     Palpations: Abdomen is soft.     Tenderness: There is no abdominal tenderness.  Musculoskeletal:        General: No swelling.     Cervical back: Neck supple.  Skin:    General: Skin is warm and dry.     Capillary Refill: Capillary refill takes less than 2 seconds.  Neurological:     Mental Status: She is alert.  Psychiatric:        Mood and Affect: Mood normal.     (all labs ordered are listed, but only abnormal results are displayed) Labs Reviewed  BASIC METABOLIC PANEL WITH GFR - Abnormal; Notable for the following components:      Result Value   Potassium 3.2 (*)    Glucose, Bld 209 (*)    All other components within normal limits  CBC  TROPONIN T, HIGH SENSITIVITY    EKG: EKG Interpretation Date/Time:  Saturday February 29 2024 22:36:38 EDT Ventricular Rate:  73 PR Interval:  156 QRS Duration:  99 QT Interval:  415 QTC Calculation: 458 R Axis:   -28  Text Interpretation: Sinus rhythm Left ventricular hypertrophy Anterior Q waves, possibly due to LVH Confirmed by Simon Rea [phone]) on 02/29/2024 10:53:59 PM  Radiology: CT Head Wo Contrast Result Date: 02/29/2024 EXAM: CT HEAD WITHOUT CONTRAST 02/29/2024 10:32:02 PM TECHNIQUE: CT of  the head was performed without the administration of intravenous contrast. Automated exposure control, iterative reconstruction, and/or weight based adjustment of the mA/kV was utilized to reduce the radiation dose to as low as reasonably achievable. COMPARISON: CT head 07/30/1999 CLINICAL HISTORY: Polytrauma, blunt. FINDINGS: BRAIN AND VENTRICLES: No acute hemorrhage. No evidence of acute infarct. No hydrocephalus. No extra-axial collection. No mass effect or midline shift. ORBITS: No acute abnormality. SINUSES: No acute abnormality. SOFT TISSUES AND SKULL: No acute soft tissue abnormality. No skull fracture. IMPRESSION: 1. No acute intracranial abnormality. Electronically signed by: Gilmore Molt MD 02/29/2024 10:47 PM EDT RP Workstation: [id number]   DG Chest Port 1 View Result Date: 02/29/2024 EXAM: 1 VIEW XRAY OF THE CHEST 02/29/2024 10:36:13 PM COMPARISON: CT chest dated 12/10/2006. CLINICAL HISTORY: Chest pain. Patient states her knees gave out causing her to fall. Patient states she hit the back of her head on the side of the tub. Patient also complains of sciatica that radiates down right leg that is not new but is bothering her tonight. Denies loss of consciousness or blood thinners. FINDINGS: LUNGS AND PLEURA: Elevated right hemidiaphragm. No focal pulmonary opacity. No pulmonary edema. No pleural effusion. No pneumothorax. HEART AND MEDIASTINUM: No acute abnormality of the cardiac and mediastinal silhouettes. BONES AND SOFT TISSUES: No acute osseous abnormality. IMPRESSION: 1. No acute cardiopulmonary process. Electronically signed by: Pinkie Pebbles MD 02/29/2024 10:42 PM EDT RP Workstation: [id number]   CT Cervical Spine Wo Contrast Result Date: 02/29/2024 EXAM: CT CERVICAL SPINE WITHOUT CONTRAST 02/29/2024 10:32:02 PM TECHNIQUE: CT of the cervical spine was performed without the administration of intravenous contrast. Multiplanar reformatted images are provided for review. Automated  exposure control, iterative reconstruction, and/or weight based adjustment of the mA/kV was utilized to reduce the radiation dose to as low as reasonably achievable. COMPARISON: None available. CLINICAL HISTORY: Polytrauma, blunt. Pt states her knees gave out causing her to fall. Pt states she  hit the back of her head on the side of the tub. Pt also complains of sciatica that radiates down the right leg that is not new but is bothering her tonight. Denies loss of consciousness or blood thinners. FINDINGS: CERVICAL SPINE: BONES AND ALIGNMENT: No acute fracture or traumatic malalignment. DEGENERATIVE CHANGES: Mild multilevel degenerative changes. SOFT TISSUES: No prevertebral soft tissue swelling. IMPRESSION: 1. No acute abnormality of the cervical spine. Electronically signed by: Pinkie Pebbles MD 02/29/2024 10:41 PM EDT RP Workstation: [id number]     Procedures   Medications Ordered in the ED - No data to display                                  Medical Decision Making Amount and/or Complexity of Data Reviewed Labs: ordered. Radiology: ordered.     HPI:  Patient presents because of fall.  Patient states that she got up from the commode today and she was try to pull up her pants whenever she had her left leg go out from underneath her.  She subsequent fell backwards and hit her head.  Did not lose consciousness.  Denies any anticoagulation.  No cervical thoracic or lumbar pain that is new for her.  Patient denies any vision changes.  Patient states that she has chronic balance issues and she has been followed up with physical therapy.  Has chronic back pain but no acute new back pain.  No new numbness or tingling aware.  No saddle anesthesia.  No bowel or bladder incontinence.  States that she did have episode of slight chest pain today.  Was tired during this episode of chest discomfort.  No history of DVT or PE.  Denies any history of ACS pathology.  No exertional chest pain or shortness of  breath.   Previous medical history reviewed : Patient last seen in the ED in August 2025.  Was seen because of back pain at that time.  Pain left sacroiliac area.  MDM:   Upon exam, patient hemodynamic stable.  ANO x 3 GCS 15.  No focal deficits.  Cranials 2 through 12 intact.  Slightly hypertensive otherwise maps appropriate.  No tachycardia or tachypnea.  O2 saturation 97% on room air.   In terms of patient's fall, will obtain CT head and CT cervical spine.  No new pain to palpation of thoracic or lumbar.  No concerns of any kind of thoracic or lumbar fracture.  Will rule out subdural epidural.   In terms of patient's chest pain.  Obtain EKG.  Will obtain troponin as well for ACS workup.  No history of DVT or PE.  No clear chest pain or hemoptysis.  No recent travel history.  No swelling or pain in the legs.  Reevaluation:   Upon reexamination, patient hemodynamically stable.  Remains A&O x 3 with GCS 15.  CT head and CT cervical spine benign from f from the fall.  No evidence of subdural epidural.  No cervical fracture.  Chest x-ray benign.  No cardiopulmonary process.   Pending initial troponin.  Plan will be for troponin x 2.  Patient will be signed out pending second troponin.     EKG Interpreted by Me: no stemi   Cardiac Tele Interpreted by Me: NSR   I have independently interpreted the CXR  and CT  images and agree with the radiologist finding   Social Determinant of Health: None   Disposition and Follow  Up: Pending repeat troponin       Final diagnoses:  Fall, initial encounter  Other chest pain    ED Discharge Orders          Ordered    Ambulatory referral to Cardiology        02/29/24 2255               Simon Lavonia SAILOR, MD 02/29/24 2314

## 2024-02-29 NOTE — ED Triage Notes (Signed)
 Pt states he knees gave out causing her to fall. Pt states she hit the back of her head on the side of the tub. Pt also c/o sciatica that radiates down R leg that is not new but is bothering her tonight. Denies LOC or blood thinners.

## 2024-02-29 NOTE — Discharge Instructions (Signed)
 Given your episode of chest pain today, I do think you should follow-up with cardiology.  I placed a referral.  They should give you a call on Monday or Tuesday to establish care  If you have any, worsening chest pain or shortness of breath then please come back to the ED.   Otherwise, please follow-up with cardiology.   Please consider physical therapy again.  Your CT imaging was negative for acute fracture or bleed.

## 2024-03-01 LAB — TROPONIN T, HIGH SENSITIVITY: Troponin T High Sensitivity: 18 ng/L (ref 0–19)

## 2024-03-01 NOTE — ED Notes (Signed)
Gave pt ice water °

## 2024-03-05 ENCOUNTER — Ambulatory Visit: Admitting: Gastroenterology

## 2024-03-05 NOTE — Progress Notes (Deleted)
 [facility] Progress Note  Chief Complaint: ***  Subjective  Prior history Former patient of Dr. Teressa History summarized in last APP office note from August 2025 and October 2024.  Many years of constipation with abdominal pain and intermittent diarrhea when she takes laxative such as MiraLAX or Linzess . History of EPI on Creon .    After August 2025 office visit was given empiric course of metronidazole  for possible SIBO. History of duodenal carcinoid with resection in 2014   Discussed the use of AI scribe software for clinical note transcription with the patient, who gave verbal consent to proceed.  History of Present Illness     ROS: Cardiovascular:  no chest pain Respiratory: no dyspnea  [name] Past Medical, Family and Social History were reviewed and are on file in the EMR. Past Medical History:  Diagnosis Date   Allergy    Anxiety    Arthritis    Cancer (HCC)    carcinoid tumor stomach   Cataract    REMOVED BILATERAL   Depression    Diabetes mellitus    Fibromyalgia    GERD (gastroesophageal reflux disease)    Headache(784.0)    Hyperlipidemia    Hyperplastic colon polyp 02/04/2013   Hypertension    IBS (irritable bowel syndrome)    Osteoporosis    Polyp of rectum 02/04/2013   Renal disorder    Shortness of breath    Sleep apnea    Vitamin D deficiency     Past Surgical History:  Procedure Laterality Date   ABDOMINAL HYSTERECTOMY     Cervical cancer, with incidental appendectomy   APPENDECTOMY     BIOPSY N/A 02/04/2013   Procedure: BIOPSY;  Surgeon: Lamar CHRISTELLA Hollingshead, MD;  Location: [facility];  Service: Endoscopy;  Laterality: N/A;  SB BX AND RANDOM COLON BX   bladder tack     BREAST LUMPECTOMY     benign   COLONOSCOPY  09/22/2003   RMR: Diminutive rectal polyps cold biopsy/removed.  Otherwise normal rectal mucosa/Pancolonic diverticula.  The remainder of the colonic mucosa appeared normal   COLONOSCOPY WITH  ESOPHAGOGASTRODUODENOSCOPY (EGD) N/A 02/04/2013   Procedure: COLONOSCOPY WITH ESOPHAGOGASTRODUODENOSCOPY (EGD);  Surgeon: Lamar CHRISTELLA Hollingshead, MD;  Location: [facility];  Service: Endoscopy;  Laterality: N/A;  10:15-moved to 9:30am   ESOPHAGOGASTRODUODENOSCOPY  11/19/2006   MFM:Emnfpwzwu Schatzki's ring with overlying distal esophageal erosions consistent with erosive reflux esophagitis, status post dilation and disruption of ring as described above/ Small hiatal hernia/Otherwise normal stomach, first duodenum and second duodenum   EUS N/A 02/12/2013   Procedure: UPPER ENDOSCOPIC ULTRASOUND (EUS) LINEAR;  Surgeon: Toribio SHAUNNA Teressa, MD;  Location: [facility];  Service: Endoscopy;  Laterality: N/A;   EUS N/A 08/20/2013   Procedure: UPPER ENDOSCOPIC ULTRASOUND (EUS) LINEAR;  Surgeon: Toribio SHAUNNA Teressa, MD;  Location: [facility];  Service: Endoscopy;  Laterality: N/A;   LAPAROSCOPIC SMALL BOWEL RESECTION N/A 03/31/2013   Procedure: LAPAROSCOPIC  ASSISTED CONVERTED TO OPEN RESECTION DUODENAL MASS/UPPER ENDOSCOPY;  Surgeon: Jina Nephew, MD;  Location: [facility];  Service: General;  Laterality: N/A;   TONSILLECTOMY       Objective:  Med list reviewed  Current Outpatient Medications:    acetaminophen  (TYLENOL  8 HOUR) 650 MG CR tablet, Take 1 tablet (650 mg total) by mouth every 8 (eight) hours as needed for pain., Disp: 30 tablet, Rfl: 0   amLODipine  (NORVASC ) 5 MG tablet, Take 1 tablet (5 mg total) by mouth daily., Disp: 90 tablet, Rfl:  3   b complex vitamins capsule, Take 1 capsule by mouth daily., Disp: , Rfl:    Blood Glucose Monitoring Suppl DEVI, 1 each by Does not apply route in the morning, at noon, and at bedtime. May substitute to any manufacturer covered by patient's insurance. DX e11.65, Disp: 1 each, Rfl: 0   chlorthalidone (HYGROTON) 25 MG tablet, TAKE ONE TABLET BY MOUTH ONCE DAILY., Disp: 90 tablet, Rfl: 3   Cholecalciferol 125 MCG (5000 UT) TABS, Take 1 capsule by mouth daily., Disp: ,  Rfl:    clonazePAM  (KLONOPIN ) 0.5 MG tablet, TAKE 1 TABLET DAILY AS NEEDED FOR ANXIETY, Disp: 30 tablet, Rfl: 2   Coenzyme Q10 (CO Q-10 PO), Take 1 tablet by mouth daily., Disp: , Rfl:    fexofenadine (ALLEGRA) 30 MG tablet, Take 30 mg by mouth daily. , Disp: , Rfl:    glipiZIDE  (GLUCOTROL  XL) 10 MG 24 hr tablet, Take 1 tablet (10 mg total) by mouth daily with breakfast., Disp: 30 tablet, Rfl: 2   hyoscyamine  (LEVBID ) 0.375 MG 12 hr tablet, TAKE 2 TABLETS BY MOUTH AS NEEDED DAILY., Disp: 60 tablet, Rfl: 3   LINZESS  72 MCG capsule, TAKE ONE CAPSULE BY MOUTH ONCE DAILY. TAKE 30 MINUTES BEFORE BREAKFAST, Disp: 30 capsule, Rfl: 2   lipase/protease/amylase (CREON ) 12000-38000 units CPEP capsule, Take 2 capsules daily with each meal, Disp: 540 capsule, Rfl: 0   lisinopril  (ZESTRIL ) 10 MG tablet, Take 1 tablet (10 mg total) by mouth daily., Disp: 90 tablet, Rfl: 3   Multiple Vitamin (MULTIVITAMIN) tablet, Take 1 tablet by mouth daily. With Vit D, Disp: , Rfl:    Omega-3 Fatty Acids (OMEGA 3 PO), Take 1 capsule by mouth daily. With Vit D, Disp: , Rfl:    omeprazole  (PRILOSEC) 40 MG capsule, TAKE ONE CAPSULE BY MOUTH TWICE DAILY., Disp: 180 capsule, Rfl: 2   oxymetazoline  (AFRIN) 0.05 % nasal spray, Place 1 spray into both nostrils 2 (two) times daily as needed for congestion., Disp: , Rfl:    potassium chloride  SA (KLOR-CON  M) 20 MEQ tablet, Take 1 tablet (20 mEq total) by mouth 2 (two) times daily for 7 days., Disp: 14 tablet, Rfl: 0   pravastatin  (PRAVACHOL ) 40 MG tablet, Take 1 tablet (40 mg total) by mouth daily., Disp: 90 tablet, Rfl: 3   pregabalin  (LYRICA ) 25 MG capsule, Take 1 capsule (25 mg total) by mouth 2 (two) times daily., Disp: 60 capsule, Rfl: 3   Probiotic Product (PROBIOTIC PO), Take 1 tablet by mouth daily., Disp: , Rfl:    Propylene Glycol-Glycerin (SOOTHE OP), Place 1 drop into both eyes 2 (two) times daily as needed. , Disp: , Rfl:    RESTASIS 0.05 % ophthalmic emulsion, Place into  both eyes 2 (two) times daily. , Disp: , Rfl:    sertraline  (ZOLOFT ) 100 MG tablet, Take 1 tablet (100 mg total) by mouth daily., Disp: 90 tablet, Rfl: 1   tiZANidine  (ZANAFLEX ) 2 MG tablet, Take 1 tablet (2 mg total) by mouth every 8 (eight) hours as needed for muscle spasms., Disp: 20 tablet, Rfl: 0   UNABLE TO FIND, Take 1 capsule by mouth daily. Med Name: Phytoceramides, Disp: , Rfl:    UNABLE TO FIND, Take 1 capsule by mouth daily. Med Name: CinnaChroma, Disp: , Rfl:    UNABLE TO FIND, Take 2 capsules by mouth daily. Med Name: PC Liver and Brain, Disp: , Rfl:    valACYclovir  (VALTREX ) 500 MG tablet, Take 1 tablet (500 mg total)  by mouth as needed. Reported on 10/31/2015, Disp: 90 tablet, Rfl: 1   Vital signs in last 24 hrs: There were no vitals filed for this visit. Wt Readings from Last 3 Encounters:  02/29/24 141 lb 1.5 oz (64 kg)  02/24/24 142 lb (64.4 kg)  01/02/24 142 lb (64.4 kg)    Physical Exam  *** HEENT: sclera anicteric, oral mucosa moist without lesions Neck: supple, no thyromegaly, JVD or lymphadenopathy Cardiac: ***,  no peripheral edema Pulm: clear to auscultation bilaterally, normal RR and effort noted Abdomen: soft, *** tenderness, with active bowel sounds. No guarding or palpable hepatosplenomegaly. Skin; warm and dry, no jaundice or rash   Labs:   ___________________________________________ Radiologic studies:   ____________________________________________ Other:   _____________________________________________   No diagnosis found.  Assessment and Plan Assessment & Plan      Plan:   *** minutes were spent on this encounter (including chart review, history/exam, counseling/coordination of care, and documentation) > 50% of that time was spent on counseling and coordination of care.   Tiffany Ortiz

## 2024-03-11 ENCOUNTER — Ambulatory Visit: Admitting: Internal Medicine

## 2024-03-22 ENCOUNTER — Other Ambulatory Visit: Payer: Self-pay | Admitting: Internal Medicine

## 2024-03-22 DIAGNOSIS — E119 Type 2 diabetes mellitus without complications: Secondary | ICD-10-CM

## 2024-04-01 ENCOUNTER — Ambulatory Visit: Admitting: Internal Medicine

## 2024-04-01 ENCOUNTER — Encounter: Payer: Self-pay | Admitting: Internal Medicine

## 2024-04-01 VITALS — BP 135/89 | HR 85 | Ht 60.0 in | Wt 148.6 lb

## 2024-04-01 DIAGNOSIS — E1159 Type 2 diabetes mellitus with other circulatory complications: Secondary | ICD-10-CM | POA: Diagnosis not present

## 2024-04-01 DIAGNOSIS — E1169 Type 2 diabetes mellitus with other specified complication: Secondary | ICD-10-CM

## 2024-04-01 DIAGNOSIS — Z23 Encounter for immunization: Secondary | ICD-10-CM | POA: Diagnosis not present

## 2024-04-01 DIAGNOSIS — I152 Hypertension secondary to endocrine disorders: Secondary | ICD-10-CM | POA: Diagnosis not present

## 2024-04-01 DIAGNOSIS — F419 Anxiety disorder, unspecified: Secondary | ICD-10-CM

## 2024-04-01 DIAGNOSIS — M51362 Other intervertebral disc degeneration, lumbar region with discogenic back pain and lower extremity pain: Secondary | ICD-10-CM

## 2024-04-01 DIAGNOSIS — R269 Unspecified abnormalities of gait and mobility: Secondary | ICD-10-CM

## 2024-04-01 DIAGNOSIS — Z7984 Long term (current) use of oral hypoglycemic drugs: Secondary | ICD-10-CM

## 2024-04-01 DIAGNOSIS — N760 Acute vaginitis: Secondary | ICD-10-CM | POA: Diagnosis not present

## 2024-04-01 DIAGNOSIS — E1140 Type 2 diabetes mellitus with diabetic neuropathy, unspecified: Secondary | ICD-10-CM | POA: Diagnosis not present

## 2024-04-01 DIAGNOSIS — I10 Essential (primary) hypertension: Secondary | ICD-10-CM

## 2024-04-01 MED ORDER — DAPAGLIFLOZIN PROPANEDIOL 5 MG PO TABS: 5.0000 mg | ORAL_TABLET | Freq: Every day | ORAL | 1 refills | Status: DC

## 2024-04-01 MED ORDER — GLIPIZIDE 10 MG PO TABS: ORAL_TABLET | ORAL | 1 refills | Status: AC

## 2024-04-01 NOTE — Assessment & Plan Note (Signed)
 Likely due to diabetic neuropathy and OA of knee/hips Also likely has DDD of lumbar spine Advised to use cane as walking support in the home to prevent falls Referred to home health for PT Check CBC, CMP, TSH

## 2024-04-01 NOTE — Assessment & Plan Note (Addendum)
 Has diabetic neuropathy and hammertoes, likely leading to gait disturbance and leg weakness Had referred to podiatry Has tried gabapentin and Cymbalta in the past Continue Lyrica  25 mg BID for now Referred to PT for gait training/leg strengthening exercises

## 2024-04-01 NOTE — Progress Notes (Addendum)
 Established Patient Office Visit  Subjective:  Patient ID: Tiffany Ortiz, female    DOB: 08-26-1945  Age: 78 y.o. MRN: [id number]  CC:  Chief Complaint  Patient presents with   Hypertension   Diabetes    HPI Tiffany Ortiz is a 78 y.o. female with past medical history of HTN, type II DM, HLD, OSA, GERD, MDD and GAD who presents for f/u of her chronic medical conditions. She used to see Dr. Melvenia.  Her daughter is present during the visit today.  HTN: Her BP is wnl.  She takes amlodipine  5 mg QD, lisinopril  10 mg QD and chlorthalidone 25 mg QD. Denies chest pain, dyspnea or palpitations currently.  Type II DM: Her last HbA1c was 8.6 in 05/25.  She takes glipizide  10 mg QD. She has noticed hyperglycemia, around 180s at home.  She does not check blood glucose regularly.  She also has history of diabetic neuropathy and hallux valgus, has been evaluated by podiatry.  She has tried gabapentin and Cymbalta in the past, but did not tolerate them.  She was given Lyrica  in the last visit, but still does not take it regularly.  Recurrent falls: She reports recurrent falls, but denies any prodromal symptoms.  She has chronic leg weakness, likely due to diabetic neuropathy and has gait disturbance.  She had a fall in 10/25 while she was in the bathroom, went to ER.  She had CT of her head and cervical spine, which were negative for any acute injury.  She was referred to home PT in the last visit, had noticed some improvement.  She has a history of chronic constipation, takes Linzess  every other day currently.  She takes fibermucil as well.  Denies melena or hematochezia currently.  She also reports episodes of diarrhea at times.  She is followed by GI. She takes hyoscyamine  as needed for abdominal cramps.  Her medication list also shows Creon , which she does not have.  She has history of carcinoid tumor of intestine, used to be followed by oncology in [place], but has lost follow-up.  GAD:  She takes Zoloft  100 mg QD.  She also has clonazepam , takes 0.25 mg twice daily as needed for severe anxiety and insomnia.  She still has difficulty maintaining sleep at nighttime, but reports daytime naps while watching TV.  Low back pain: She has chronic low back pain.  She went to urgent care, had x-ray of lumbar spine, which showed chronic degenerative changes.  Her pain is intermittent, sharp, radiating to bilateral buttock area, associated with bilateral leg weakness and worse with prolonged sitting.  She has tried taking Tylenol  with mild relief.  She takes tizanidine  as needed for muscle spasms.  Past Medical History:  Diagnosis Date   Allergy    Anxiety    Arthritis    Cancer (HCC)    carcinoid tumor stomach   Cataract    REMOVED BILATERAL   Depression    Diabetes mellitus    Fibromyalgia    GERD (gastroesophageal reflux disease)    Headache(784.0)    Hyperlipidemia    Hyperplastic colon polyp 02/04/2013   Hypertension    IBS (irritable bowel syndrome)    Osteoporosis    Polyp of rectum 02/04/2013   Renal disorder    Shortness of breath    Sleep apnea    Vitamin D deficiency     Past Surgical History:  Procedure Laterality Date   ABDOMINAL HYSTERECTOMY     Cervical cancer, with  incidental appendectomy   APPENDECTOMY     BIOPSY N/A 02/04/2013   Procedure: BIOPSY;  Surgeon: Lamar CHRISTELLA Hollingshead, MD;  [facility]: [facility];  Service: Endoscopy;  Laterality: N/A;  SB BX AND RANDOM COLON BX   bladder tack     BREAST LUMPECTOMY     benign   COLONOSCOPY  09/22/2003   RMR: Diminutive rectal polyps cold biopsy/removed.  Otherwise normal rectal mucosa/Pancolonic diverticula.  The remainder of the colonic mucosa appeared normal   COLONOSCOPY WITH ESOPHAGOGASTRODUODENOSCOPY (EGD) N/A 02/04/2013   Procedure: COLONOSCOPY WITH ESOPHAGOGASTRODUODENOSCOPY (EGD);  Surgeon: Lamar CHRISTELLA Hollingshead, MD;  Location: [facility];  Service: Endoscopy;  Laterality: N/A;  10:15-moved to 9:30am    ESOPHAGOGASTRODUODENOSCOPY  11/19/2006   MFM:Emnfpwzwu Schatzki's ring with overlying distal esophageal erosions consistent with erosive reflux esophagitis, status post dilation and disruption of ring as described above/ Small hiatal hernia/Otherwise normal stomach, first duodenum and second duodenum   EUS N/A 02/12/2013   Procedure: UPPER ENDOSCOPIC ULTRASOUND (EUS) LINEAR;  Surgeon: Toribio SHAUNNA Cedar, MD;  Location: [facility];  Service: Endoscopy;  Laterality: N/A;   EUS N/A 08/20/2013   Procedure: UPPER ENDOSCOPIC ULTRASOUND (EUS) LINEAR;  Surgeon: Toribio SHAUNNA Cedar, MD;  Location: [facility];  Service: Endoscopy;  Laterality: N/A;   LAPAROSCOPIC SMALL BOWEL RESECTION N/A 03/31/2013   Procedure: LAPAROSCOPIC  ASSISTED CONVERTED TO OPEN RESECTION DUODENAL MASS/UPPER ENDOSCOPY;  Surgeon: Jina Nephew, MD;  Location: [facility];  Service: General;  Laterality: N/A;   TONSILLECTOMY      Family History  Problem Relation Age of Onset   Cancer Mother        ?ovarian   Cancer Father    Colon cancer Neg Hx    Colon polyps Neg Hx    Esophageal cancer Neg Hx    Rectal cancer Neg Hx    Stomach cancer Neg Hx    Pancreatic cancer Neg Hx     Social History   Socioeconomic History   Marital status: Married    Spouse name: Not on file   Number of children: 2   Years of education: Not on file   Highest education level: Not on file  Occupational History   Occupation: retired  Tobacco Use   Smoking status: Former    Types: Cigarettes   Smokeless tobacco: Former    Quit date: 02/11/1970  Vaping Use   Vaping status: Never Used  Substance and Sexual Activity   Alcohol  use: No   Drug use: No   Sexual activity: Not on file  Other Topics Concern   Not on file  Social History Narrative   Married, to [employer]   #2 grown children   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 and decorated cakes in home   Social Drivers of Health   Financial Resource Strain: Low Risk  (02/24/2024)   Overall  Financial Resource Strain (CARDIA)    Difficulty of Paying Living Expenses: Not very hard  Food Insecurity: No Food Insecurity (02/24/2024)   Hunger Vital Sign    Worried About Running Out of Food in the Last Year: Never true    Ran Out of Food in the Last Year: Never true  Transportation Needs: No Transportation Needs (02/24/2024)   PRAPARE - Administrator, Civil Service (Medical): No    Lack of Transportation (Non-Medical): No  Physical Activity: Insufficiently Active (02/24/2024)   Exercise Vital Sign    Days of Exercise per Week: 3 days    Minutes  of Exercise per Session: 30 min  Stress: No Stress Concern Present (02/24/2024)   [employer] of Occupational Health - Occupational Stress Questionnaire    Feeling of Stress: Only a little  Social Connections: Moderately Integrated (02/24/2024)   Social Connection and Isolation Panel    Frequency of Communication with Friends and Family: More than three times a week    Frequency of Social Gatherings with Friends and Family: More than three times a week    Attends Religious Services: More than 4 times per year    Active Member of [employer] or Organizations: No    Attends Banker Meetings: Never    Marital Status: Married  Catering Manager Violence: Not At Risk (02/24/2024)   Humiliation, Afraid, Rape, and Kick questionnaire    Fear of Current or Ex-Partner: No    Emotionally Abused: No    Physically Abused: No    Sexually Abused: No    Outpatient Medications Prior to Visit  Medication Sig Dispense Refill   acetaminophen  (TYLENOL  8 HOUR) 650 MG CR tablet Take 1 tablet (650 mg total) by mouth every 8 (eight) hours as needed for pain. 30 tablet 0   amLODipine  (NORVASC ) 5 MG tablet Take 1 tablet (5 mg total) by mouth daily. 90 tablet 3   b complex vitamins capsule Take 1 capsule by mouth daily.     Blood Glucose Monitoring Suppl DEVI 1 each by Does not apply route in the morning, at noon, and at bedtime. May  substitute to any manufacturer covered by patient's insurance. DX e11.65 1 each 0   chlorthalidone (HYGROTON) 25 MG tablet TAKE ONE TABLET BY MOUTH ONCE DAILY. 90 tablet 3   Cholecalciferol 125 MCG (5000 UT) TABS Take 1 capsule by mouth daily.     clonazePAM  (KLONOPIN ) 0.5 MG tablet TAKE 1 TABLET DAILY AS NEEDED FOR ANXIETY 30 tablet 2   Coenzyme Q10 (CO Q-10 PO) Take 1 tablet by mouth daily.     fexofenadine (ALLEGRA) 30 MG tablet Take 30 mg by mouth daily.      hyoscyamine  (LEVBID ) 0.375 MG 12 hr tablet TAKE 2 TABLETS BY MOUTH AS NEEDED DAILY. 60 tablet 3   LINZESS  72 MCG capsule TAKE ONE CAPSULE BY MOUTH ONCE DAILY. TAKE 30 MINUTES BEFORE BREAKFAST 30 capsule 2   lipase/protease/amylase (CREON ) 12000-38000 units CPEP capsule Take 2 capsules daily with each meal 540 capsule 0   lisinopril  (ZESTRIL ) 10 MG tablet Take 1 tablet (10 mg total) by mouth daily. 90 tablet 3   Multiple Vitamin (MULTIVITAMIN) tablet Take 1 tablet by mouth daily. With Vit D     Omega-3 Fatty Acids (OMEGA 3 PO) Take 1 capsule by mouth daily. With Vit D     omeprazole  (PRILOSEC) 40 MG capsule TAKE ONE CAPSULE BY MOUTH TWICE DAILY. 180 capsule 2   oxymetazoline  (AFRIN) 0.05 % nasal spray Place 1 spray into both nostrils 2 (two) times daily as needed for congestion.     potassium chloride  SA (KLOR-CON  M) 20 MEQ tablet Take 1 tablet (20 mEq total) by mouth 2 (two) times daily for 7 days. 14 tablet 0   pravastatin  (PRAVACHOL ) 40 MG tablet Take 1 tablet (40 mg total) by mouth daily. 90 tablet 3   pregabalin  (LYRICA ) 25 MG capsule Take 1 capsule (25 mg total) by mouth 2 (two) times daily. 60 capsule 3   Probiotic Product (PROBIOTIC PO) Take 1 tablet by mouth daily.     Propylene Glycol-Glycerin (SOOTHE OP) Place 1 drop  into both eyes 2 (two) times daily as needed.      RESTASIS 0.05 % ophthalmic emulsion Place into both eyes 2 (two) times daily.      sertraline  (ZOLOFT ) 100 MG tablet Take 1 tablet (100 mg total) by mouth daily.  90 tablet 1   tiZANidine  (ZANAFLEX ) 2 MG tablet Take 1 tablet (2 mg total) by mouth every 8 (eight) hours as needed for muscle spasms. 20 tablet 0   UNABLE TO FIND Take 1 capsule by mouth daily. Med Name: Phytoceramides     UNABLE TO FIND Take 1 capsule by mouth daily. Med Name: CinnaChroma     UNABLE TO FIND Take 2 capsules by mouth daily. Med Name: PC Liver and Brain     valACYclovir  (VALTREX ) 500 MG tablet Take 1 tablet (500 mg total) by mouth as needed. Reported on 10/31/2015 90 tablet 1   glipiZIDE  (GLUCOTROL  XL) 10 MG 24 hr tablet Take 1 tablet (10 mg total) by mouth daily with breakfast. 30 tablet 2   No facility-administered medications prior to visit.    Allergies  Allergen Reactions   Other     Metal alloy-breaks her out-esp. Metal surgical instruments or some metal implanted in me   Dorethia [Aspirin]     Kidney doctor told her to not take ASA   Erythromycin     Doesn't remember    Famciclovir     migrane   Hydrocodone     makes me lose my mind-hallucinations   Ibuprofen     Kidney doctor told her to not take ibuprofen.   Lactose Intolerance (Gi) Diarrhea and Other (See Comments)    Severe diarrhea   Morphine  And Codeine Other (See Comments)    Pt says Morphine  makes her go out of her head   Oxycodone     hallucinations   Promethazine Nausea And Vomiting   Tilactase Diarrhea   Tylenol  [Acetaminophen ]     Knocks her out.    ROS Review of Systems  Constitutional:  Positive for fatigue. Negative for chills and fever.  HENT:  Negative for congestion and sore throat.   Eyes:  Negative for pain and discharge.  Respiratory:  Negative for cough and shortness of breath.   Cardiovascular:  Negative for chest pain and palpitations.  Gastrointestinal:  Positive for constipation. Negative for abdominal pain, diarrhea, nausea and vomiting.  Endocrine: Negative for polydipsia and polyuria.  Genitourinary:  Negative for dysuria and hematuria.  Musculoskeletal:  Positive for  arthralgias, back pain, gait problem and neck pain. Negative for neck stiffness.  Skin:  Negative for rash.  Neurological:  Positive for weakness (Bilateral legs). Negative for headaches.  Psychiatric/Behavioral:  Positive for sleep disturbance. Negative for agitation and behavioral problems. The patient is nervous/anxious.       Objective:    Physical Exam Vitals reviewed.  Constitutional:      General: She is not in acute distress.    Appearance: She is not diaphoretic.     Comments: Has a cane  HENT:     Head: Normocephalic and atraumatic.     Nose: Nose normal. No congestion.     Mouth/Throat:     Mouth: Mucous membranes are moist.     Pharynx: No posterior oropharyngeal erythema.  Eyes:     General: No scleral icterus.    Extraocular Movements: Extraocular movements intact.  Cardiovascular:     Rate and Rhythm: Normal rate and regular rhythm.     Heart sounds: Normal heart sounds.  No murmur heard. Pulmonary:     Breath sounds: Normal breath sounds. No wheezing or rales.  Musculoskeletal:     Cervical back: Neck supple. No tenderness.     Lumbar back: Tenderness present. Decreased range of motion.     Right lower leg: No edema.     Left lower leg: No edema.  Feet:     Comments: Hallux valgus bilaterally Skin:    General: Skin is warm.     Findings: No rash.  Neurological:     General: No focal deficit present.     Mental Status: She is alert and oriented to person, place, and time.     Sensory: Sensory deficit (Bilateral feet) present.     Motor: Weakness (Bilateral LE - 3/5) present.     Gait: Gait abnormal.  Psychiatric:        Mood and Affect: Mood normal.        Behavior: Behavior normal.     BP 135/89   Pulse 85   Ht 5' (1.524 m)   Wt 148 lb 9.6 oz (67.4 kg)   SpO2 97%   BMI 29.02 kg/m  Wt Readings from Last 3 Encounters:  04/01/24 148 lb 9.6 oz (67.4 kg)  02/29/24 141 lb 1.5 oz (64 kg)  02/24/24 142 lb (64.4 kg)    Lab Results  Component  Value Date   TSH 0.69 02/25/2023   Lab Results  Component Value Date   WBC 8.6 02/29/2024   HGB 13.3 02/29/2024   HCT 39.0 02/29/2024   MCV 88.8 02/29/2024   PLT 302 02/29/2024   Lab Results  Component Value Date   NA 140 02/29/2024   K 3.2 (L) 02/29/2024   CO2 25 02/29/2024   GLUCOSE 209 (H) 02/29/2024   BUN 21 02/29/2024   CREATININE 0.88 02/29/2024   BILITOT 0.3 02/25/2023   ALKPHOS 84 02/25/2023   AST 22 02/25/2023   ALT 27 02/25/2023   PROT 7.1 02/25/2023   ALBUMIN 3.9 02/25/2023   CALCIUM 9.4 02/29/2024   ANIONGAP 11 02/29/2024   EGFR 71 04/08/2023   GFR 57.20 (L) 02/25/2023   Lab Results  Component Value Date   CHOL 228 (H) 04/08/2023   Lab Results  Component Value Date   HDL 37 (L) 04/08/2023   Lab Results  Component Value Date   LDLCALC 128 (H) 04/08/2023   Lab Results  Component Value Date   TRIG 352 (H) 04/08/2023   Lab Results  Component Value Date   CHOLHDL 6.2 (H) 04/08/2023   Lab Results  Component Value Date   HGBA1C 8.6 (H) 09/06/2023      Assessment & Plan:   Problem List Items Addressed This Visit       Cardiovascular and Mediastinum   Essential hypertension   BP Readings from Last 1 Encounters:  04/01/24 135/89   Usually Well-controlled with amlodipine  5 mg QD, lisinopril  10 mg QD and chlorthalidone 25 mg QD Due to episodes of fall, reduced dose of chlorthalidone to 12.5 mg due to concern for orthostatic hypotension/dehydration Counseled for compliance with the medications Advised DASH diet and ambulatory as tolerated        Endocrine   Type 2 diabetes mellitus with other specified complication (HCC)   Lab Results  Component Value Date   HGBA1C 8.6 (H) 09/06/2023   Uncontrolled due to diet/medication noncompliance in the past Associated with HTN, HLD and diabetic neuropathy On glipizide  10 mg QD for now, changed to glipizide  10 mg  every morning and 5 mg every evening  Considered SGLT2 inhibitor, but she had  vulvovaginitis in the past with it - she currently also reports vaginal itching/irritation Advised to follow diabetic diet On statin and ACEi F/u CMP, HbA1c and lipid panel, needs updated labs Diabetic eye exam: Advised to follow up with Ophthalmology for diabetic eye exam      Relevant Medications   glipiZIDE  (GLUCOTROL ) 10 MG tablet   Other Relevant Orders   Microalbumin / creatinine urine ratio   Type 2 diabetes mellitus with diabetic neuropathy, without long-term current use of insulin  (HCC) - Primary   Has diabetic neuropathy and hammertoes, likely leading to gait disturbance and leg weakness Had referred to podiatry Has tried gabapentin and Cymbalta in the past Continue Lyrica  25 mg BID for now Referred to PT for gait training/leg strengthening exercises      Relevant Medications   glipiZIDE  (GLUCOTROL ) 10 MG tablet   Other Relevant Orders   Ambulatory referral to Home Health     Musculoskeletal and Integument   Degeneration of intervertebral disc of lumbar region with discogenic back pain and lower extremity pain   Has chronic low back pain Advised to take Tylenol  arthritis as needed for mild to moderate pain Tizanidine  as needed for muscle spasms Lyrica  can also help with neuropathic pain If persistent pain, can consider oral NSAID for short-term Avoid heavy lifting and frequent bending        Genitourinary   Acute vaginitis   She reports vaginal itching/irritation Check vaginal swab      Relevant Orders   NuSwab Vaginitis Plus (VG+)     Other   Anxiety   Overall well-controlled with Zoloft  100 mg QD Takes clonazepam  0.25 mg twice daily as needed - PDMP reviewed, refilled      Gait disturbance   Likely due to diabetic neuropathy and OA of knee/hips Also likely has DDD of lumbar spine Advised to use cane as walking support in the home to prevent falls Referred to home health for PT Check CBC, CMP, TSH      Relevant Orders   Ambulatory referral to Home  Health   Other Visit Diagnoses       Encounter for immunization       Relevant Orders   Flu vaccine HIGH DOSE PF(Fluzone Trivalent) (Completed)        Meds ordered this encounter  Medications   DISCONTD: dapagliflozin  propanediol (FARXIGA ) 5 MG TABS tablet    Sig: Take 1 tablet (5 mg total) by mouth daily.    Dispense:  90 tablet    Refill:  1   glipiZIDE  (GLUCOTROL ) 10 MG tablet    Sig: Take 1 tablet (10 mg total) by mouth daily before breakfast AND 0.5 tablets (5 mg total) every evening.    Dispense:  135 tablet    Refill:  1    Follow-up: Return in about 4 months (around 07/30/2024) for DM.    Suzzane MARLA Blanch, MD

## 2024-04-01 NOTE — Assessment & Plan Note (Signed)
 She reports vaginal itching/irritation Check vaginal swab

## 2024-04-01 NOTE — Assessment & Plan Note (Addendum)
 Lab Results  Component Value Date   HGBA1C 8.6 (H) 09/06/2023   Uncontrolled due to diet/medication noncompliance in the past Associated with HTN, HLD and diabetic neuropathy On glipizide  10 mg QD for now, changed to glipizide  10 mg every morning and 5 mg every evening  Considered SGLT2 inhibitor, but she had vulvovaginitis in the past with it - she currently also reports vaginal itching/irritation Advised to follow diabetic diet On statin and ACEi F/u CMP, HbA1c and lipid panel, needs updated labs Diabetic eye exam: Advised to follow up with Ophthalmology for diabetic eye exam

## 2024-04-01 NOTE — Assessment & Plan Note (Signed)
 Has chronic low back pain Advised to take Tylenol  arthritis as needed for mild to moderate pain Tizanidine  as needed for muscle spasms Lyrica  can also help with neuropathic pain If persistent pain, can consider oral NSAID for short-term Avoid heavy lifting and frequent bending

## 2024-04-01 NOTE — Addendum Note (Signed)
 Addended byBETHA TOBIE DOWNS on: 04/01/2024 05:24 PM   Modules accepted: Level of Service

## 2024-04-01 NOTE — Patient Instructions (Addendum)
 Please start taking Chlorthalidone half tablet once daily instead of one tablet.  Please start taking Glipizide  10 mg in the morning and 5 mg (half tablet of 10 mg) in the evening.  Please continue to take other medications as prescribed.  Please continue to follow low carb diet and ambulate as tolerated.

## 2024-04-01 NOTE — Assessment & Plan Note (Signed)
 Overall well-controlled with Zoloft  100 mg QD Takes clonazepam  0.25 mg twice daily as needed - PDMP reviewed, refilled

## 2024-04-01 NOTE — Assessment & Plan Note (Signed)
 BP Readings from Last 1 Encounters:  04/01/24 135/89   Usually Well-controlled with amlodipine  5 mg QD, lisinopril  10 mg QD and chlorthalidone 25 mg QD Due to episodes of fall, reduced dose of chlorthalidone to 12.5 mg due to concern for orthostatic hypotension/dehydration Counseled for compliance with the medications Advised DASH diet and ambulatory as tolerated

## 2024-04-05 LAB — NUSWAB VAGINITIS PLUS (VG+)
Candida albicans, NAA: POSITIVE — AB
Candida glabrata, NAA: NEGATIVE
Chlamydia trachomatis, NAA: NEGATIVE
Neisseria gonorrhoeae, NAA: NEGATIVE
Trich vag by NAA: NEGATIVE

## 2024-04-06 ENCOUNTER — Ambulatory Visit: Payer: Self-pay | Admitting: Internal Medicine

## 2024-04-06 ENCOUNTER — Other Ambulatory Visit: Payer: Self-pay | Admitting: Internal Medicine

## 2024-04-06 DIAGNOSIS — B3731 Acute candidiasis of vulva and vagina: Secondary | ICD-10-CM

## 2024-04-06 MED ORDER — FLUCONAZOLE 150 MG PO TABS: 150.0000 mg | ORAL_TABLET | ORAL | 0 refills | Status: DC

## 2024-04-07 DIAGNOSIS — E1169 Type 2 diabetes mellitus with other specified complication: Secondary | ICD-10-CM | POA: Diagnosis not present

## 2024-04-09 ENCOUNTER — Telehealth: Payer: Self-pay

## 2024-04-09 DIAGNOSIS — E1140 Type 2 diabetes mellitus with diabetic neuropathy, unspecified: Secondary | ICD-10-CM

## 2024-04-09 LAB — MICROALBUMIN / CREATININE URINE RATIO
Creatinine, Urine: 80.4 mg/dL
Microalb/Creat Ratio: 27 mg/g{creat} (ref 0–29)
Microalbumin, Urine: 21.8 ug/mL

## 2024-04-09 NOTE — Telephone Encounter (Signed)
 Patient was identified as falling into the [place] Measure - Diabetes.   Patient was: Appointment scheduled for lab or office visit for A1c.

## 2024-04-14 ENCOUNTER — Telehealth: Payer: Self-pay

## 2024-04-14 ENCOUNTER — Ambulatory Visit

## 2024-04-14 NOTE — Telephone Encounter (Signed)
 Copied from CRM #[id number]. Topic: Clinical - Home Health Verbal Orders >> Apr 14, 2024 11:43 AM Winona SAUNDERS wrote:  Jasmine from [facility] calling to inform the provider of delay in start of care fro Friday, Dec 13th requested by the pts daughter

## 2024-04-17 ENCOUNTER — Telehealth: Payer: Self-pay

## 2024-04-17 NOTE — Telephone Encounter (Signed)
 Copied from CRM #[id number]. Topic: General - Other >> Apr 17, 2024  2:57 PM Geneva B wrote: Reason for CRM: [facility] is calling requesting a verbal order please call lindsey [phone]

## 2024-04-20 ENCOUNTER — Ambulatory Visit

## 2024-04-20 NOTE — Telephone Encounter (Signed)
 Left vmail

## 2024-04-21 NOTE — Progress Notes (Signed)
° °  04/21/2024  Patient ID: Tiffany Ortiz, female   DOB: 1945-06-13, 78 y.o.   MRN: [id number]  Pharmacy Quality Measure Review  This patient is appearing on a report for being at risk of failing the adherence measure for hypertension (ACEi/ARB) medications this calendar year.   Medication: lisinopril  Last fill date: 03/31/24 for 90 day supply  Insurance report was not up to date. No action needed at this time.   Lang Sieve, PharmD, BCGP Clinical Pharmacist  [phone]

## 2024-04-23 ENCOUNTER — Ambulatory Visit: Payer: Self-pay | Admitting: Internal Medicine

## 2024-05-08 ENCOUNTER — Other Ambulatory Visit: Payer: Self-pay | Admitting: Nurse Practitioner

## 2024-05-08 DIAGNOSIS — K8689 Other specified diseases of pancreas: Secondary | ICD-10-CM

## 2024-05-13 ENCOUNTER — Encounter: Payer: Self-pay | Admitting: Internal Medicine

## 2024-05-13 ENCOUNTER — Ambulatory Visit: Attending: Internal Medicine | Admitting: Internal Medicine

## 2024-05-13 ENCOUNTER — Ambulatory Visit: Admitting: Internal Medicine

## 2024-05-13 VITALS — BP 158/74 | HR 71 | Ht 60.0 in | Wt 149.8 lb

## 2024-05-13 DIAGNOSIS — Z01810 Encounter for preprocedural cardiovascular examination: Secondary | ICD-10-CM

## 2024-05-13 DIAGNOSIS — M7989 Other specified soft tissue disorders: Secondary | ICD-10-CM | POA: Diagnosis not present

## 2024-05-13 DIAGNOSIS — R079 Chest pain, unspecified: Secondary | ICD-10-CM

## 2024-05-13 MED ORDER — METOPROLOL TARTRATE 100 MG PO TABS: 100.0000 mg | ORAL_TABLET | Freq: Once | ORAL | 0 refills | Status: DC

## 2024-05-13 NOTE — Progress Notes (Signed)
 "    Cardiology Office Note  Date: 05/13/2024   ID: Tiffany Ortiz, DOB [birth year], MRN [id number]  PCP:  Tobie Suzzane POUR, MD  Cardiologist:  Diannah SHAUNNA Maywood, MD Electrophysiologist:  None   History of Present Illness: Tiffany Ortiz is a 79 y.o. female known to have HTN, DM 2, HLD, carcinoid tumor of the stomach was referred to cardiology clinic for evaluation of chest pain.  Patient has longstanding GERD.  She has burning chest pains for a long time.  She is currently on omeprazole  40 mg BID but this is not improving her pains.  She also is going through stress and having chest tightness.  Not much active at home.  She lives with her husband.  They both do activities together. she also has chronic SOB with exertion.  No dizziness, syncope, leg swelling.  I reviewed echocardiogram, ABI, carotid ultrasound Doppler from 2023 performed at [facility] that was within normal limits.  Past Medical History:  Diagnosis Date   Allergy    Anxiety    Arthritis    Cancer (HCC)    carcinoid tumor stomach   Cataract    REMOVED BILATERAL   Depression    Diabetes mellitus    Fibromyalgia    GERD (gastroesophageal reflux disease)    Headache(784.0)    Hyperlipidemia    Hyperplastic colon polyp 02/04/2013   Hypertension    IBS (irritable bowel syndrome)    Osteoporosis    Polyp of rectum 02/04/2013   Renal disorder    Shortness of breath    Sleep apnea    Vitamin D deficiency     Past Surgical History:  Procedure Laterality Date   ABDOMINAL HYSTERECTOMY     Cervical cancer, with incidental appendectomy   APPENDECTOMY     BIOPSY N/A 02/04/2013   Procedure: BIOPSY;  Surgeon: Lamar CHRISTELLA Hollingshead, MD;  [facility];  Service: Endoscopy;  Laterality: N/A;  SB BX AND RANDOM COLON BX   bladder tack     BREAST LUMPECTOMY     benign   COLONOSCOPY  09/22/2003   RMR: Diminutive rectal polyps cold biopsy/removed.  Otherwise normal rectal mucosa/Pancolonic  diverticula.  The remainder of the colonic mucosa appeared normal   COLONOSCOPY WITH ESOPHAGOGASTRODUODENOSCOPY (EGD) N/A 02/04/2013   Procedure: COLONOSCOPY WITH ESOPHAGOGASTRODUODENOSCOPY (EGD);  Surgeon: Lamar CHRISTELLA Hollingshead, MD;  [facility];  Service: Endoscopy;  Laterality: N/A;  10:15-moved to 9:30am   ESOPHAGOGASTRODUODENOSCOPY  11/19/2006   MFM:Emnfpwzwu Schatzki's ring with overlying distal esophageal erosions consistent with erosive reflux esophagitis, status post dilation and disruption of ring as described above/ Small hiatal hernia/Otherwise normal stomach, first duodenum and second duodenum   EUS N/A 02/12/2013   Procedure: UPPER ENDOSCOPIC ULTRASOUND (EUS) LINEAR;  Surgeon: Toribio SHAUNNA Cedar, MD;  Location: [facility];  Service: Endoscopy;  Laterality: N/A;   EUS N/A 08/20/2013   Procedure: UPPER ENDOSCOPIC ULTRASOUND (EUS) LINEAR;  Surgeon: Toribio SHAUNNA Cedar, MD;  Location: [facility];  Service: Endoscopy;  Laterality: N/A;   LAPAROSCOPIC SMALL BOWEL RESECTION N/A 03/31/2013   Procedure: LAPAROSCOPIC  ASSISTED CONVERTED TO OPEN RESECTION DUODENAL MASS/UPPER ENDOSCOPY;  Surgeon: Jina Nephew, MD;  Location: [facility];  Service: General;  Laterality: N/A;   TONSILLECTOMY      Current Outpatient Medications  Medication Sig Dispense Refill   acetaminophen  (TYLENOL  8 HOUR) 650 MG CR tablet Take 1 tablet (650 mg total) by mouth every 8 (eight) hours as needed for pain. 30 tablet 0  amLODipine  (NORVASC ) 5 MG tablet Take 1 tablet (5 mg total) by mouth daily. 90 tablet 3   b complex vitamins capsule Take 1 capsule by mouth daily.     Blood Glucose Monitoring Suppl DEVI 1 each by Does not apply route in the morning, at noon, and at bedtime. May substitute to any manufacturer covered by patient's insurance. DX e11.65 1 each 0   chlorthalidone (HYGROTON) 25 MG tablet TAKE ONE TABLET BY MOUTH ONCE DAILY. 90 tablet 3   Cholecalciferol 125 MCG (5000 UT) TABS Take 1 capsule by mouth daily.      clonazePAM  (KLONOPIN ) 0.5 MG tablet TAKE 1 TABLET DAILY AS NEEDED FOR ANXIETY 30 tablet 2   Coenzyme Q10 (CO Q-10 PO) Take 1 tablet by mouth daily.     fexofenadine (ALLEGRA) 30 MG tablet Take 30 mg by mouth daily.      glipiZIDE  (GLUCOTROL ) 10 MG tablet Take 1 tablet (10 mg total) by mouth daily before breakfast AND 0.5 tablets (5 mg total) every evening. 135 tablet 1   hyoscyamine  (LEVBID ) 0.375 MG 12 hr tablet TAKE 2 TABLETS BY MOUTH AS NEEDED DAILY. 60 tablet 3   LINZESS  72 MCG capsule TAKE ONE CAPSULE BY MOUTH ONCE DAILY. TAKE 30 MINUTES BEFORE BREAKFAST 30 capsule 2   lipase/protease/amylase (CREON ) 12000-38000 units CPEP capsule TAKE 2 CAPSULES BY MOUTH WITH EACH MEAL 180 capsule 2   lisinopril  (ZESTRIL ) 10 MG tablet Take 1 tablet (10 mg total) by mouth daily. 90 tablet 3   Multiple Vitamin (MULTIVITAMIN) tablet Take 1 tablet by mouth daily. With Vit D     Omega-3 Fatty Acids (OMEGA 3 PO) Take 1 capsule by mouth daily. With Vit D     omeprazole  (PRILOSEC) 40 MG capsule TAKE ONE CAPSULE BY MOUTH TWICE DAILY. 180 capsule 2   oxymetazoline  (AFRIN) 0.05 % nasal spray Place 1 spray into both nostrils 2 (two) times daily as needed for congestion.     potassium chloride  SA (KLOR-CON  M) 20 MEQ tablet Take 1 tablet (20 mEq total) by mouth 2 (two) times daily for 7 days. 14 tablet 0   pravastatin  (PRAVACHOL ) 40 MG tablet Take 1 tablet (40 mg total) by mouth daily. 90 tablet 3   pregabalin  (LYRICA ) 25 MG capsule Take 1 capsule (25 mg total) by mouth 2 (two) times daily. 60 capsule 3   Probiotic Product (PROBIOTIC PO) Take 1 tablet by mouth daily.     Propylene Glycol-Glycerin (SOOTHE OP) Place 1 drop into both eyes 2 (two) times daily as needed.      RESTASIS 0.05 % ophthalmic emulsion Place into both eyes 2 (two) times daily.      sertraline  (ZOLOFT ) 100 MG tablet Take 1 tablet (100 mg total) by mouth daily. 90 tablet 1   tiZANidine  (ZANAFLEX ) 2 MG tablet Take 1 tablet (2 mg total) by mouth every  8 (eight) hours as needed for muscle spasms. 20 tablet 0   UNABLE TO FIND Take 1 capsule by mouth daily. Med Name: Phytoceramides     UNABLE TO FIND Take 1 capsule by mouth daily. Med Name: CinnaChroma     UNABLE TO FIND Take 2 capsules by mouth daily. Med Name: PC Liver and Brain     valACYclovir  (VALTREX ) 500 MG tablet Take 1 tablet (500 mg total) by mouth as needed. Reported on 10/31/2015 90 tablet 1   No current facility-administered medications for this visit.   Allergies:  Other, Asa [aspirin], Erythromycin, Famciclovir, Hydrocodone, Ibuprofen, Lactose intolerance (  gi), Morphine  and codeine, Oxycodone, Promethazine, Tilactase, and Tylenol  [acetaminophen ]   Social History: The patient  reports that she has quit smoking. Her smoking use included cigarettes. She quit smokeless tobacco use about 54 years ago. She reports that she does not drink alcohol  and does not use drugs.   Family History: [name] family history includes Cancer in her father and mother.   ROS:  Please see the history of present illness. Otherwise, complete review of systems is positive for none  All other systems are reviewed and negative.   Physical Exam: VS:  BP (!) 152/70   Pulse 71   Ht 5' (1.524 m)   Wt 149 lb 12.8 oz (67.9 kg)   SpO2 97%   BMI 29.26 kg/m , BMI Body mass index is 29.26 kg/m.  Wt Readings from Last 3 Encounters:  05/13/24 149 lb 12.8 oz (67.9 kg)  04/01/24 148 lb 9.6 oz (67.4 kg)  02/29/24 141 lb 1.5 oz (64 kg)    General: Patient appears comfortable at rest. HEENT: Conjunctiva and lids normal, oropharynx clear with moist mucosa. Neck: Supple, no elevated JVP or carotid bruits, no thyromegaly. Lungs: Clear to auscultation, nonlabored breathing at rest. Cardiac: Regular rate and rhythm, no S3 or significant systolic murmur, no pericardial rub. Abdomen: Soft, nontender, no hepatomegaly, bowel sounds present, no guarding or rebound. Extremities: No pitting edema, distal pulses  2+. Skin: Warm and dry. Musculoskeletal: No kyphosis. Neuropsychiatric: Alert and oriented x3, affect grossly appropriate.  Recent Labwork: 02/29/2024: BUN 21; Creatinine, Ser 0.88; Hemoglobin 13.3; Platelets 302; Potassium 3.2; Sodium 140     Component Value Date/Time   CHOL 228 (H) 04/08/2023 1527   TRIG 352 (H) 04/08/2023 1527   HDL 37 (L) 04/08/2023 1527   CHOLHDL 6.2 (H) 04/08/2023 1527   LDLCALC 128 (H) 04/08/2023 1527    Assessment and Plan:  Chest pain - Similar to acid reflux pains.  Has history of chronic GERD. - She also has chest tightness during stress.  Not much active at home. - Echocardiogram, Lexiscan with normal limits in 2023 at [facility]. - Obtain CT cardiac.  Dx risk factors include HTN, DM 2.   Bilateral leg swelling - RLE>LLE sw obtain ultrasound venous Doppler lower extremities to rule out DVT.  She is not much active at home.  HTN, partially controlled - Previous blood pressures normal.  Continue current antihypertensives.  Continue amlodipine  5 mg once daily, chlorthalidone 25 mg once daily, lisinopril  10 mg once daily.  40 minutes spent in reviewing prior medical records, reports, more than 3 labs, discussion and documentation.     Medication Adjustments/Labs and Tests Ordered: Current medicines are reviewed at length with the patient today.  Concerns regarding medicines are outlined above.    Disposition:  Follow up 3 months  Signed Raevin Wierenga Priya Britt Theard, MD, 05/13/2024 3:17 PM    [facility] at [facility] [address] [place], [place], [place] [address]  "

## 2024-05-13 NOTE — Patient Instructions (Addendum)
 Medication Instructions:  Your physician recommends that you continue on your current medications as directed. Please refer to the Current Medication list given to you today.   Labwork: BMET to be completed in 1-2 weeks at [facility] in [place]  Testing/Procedures: Your physician has requested that you have a lower or upper extremity venous duplex. This test is an ultrasound of the veins in the legs or arms. It looks at venous blood flow that carries blood from the heart to the legs or arms. Allow one hour for a Lower Venous exam. Allow thirty minutes for an Upper Venous exam. There are no restrictions or special instructions.  Please note: We ask at that you not bring children with you during ultrasound (echo/ vascular) testing. Due to room size and safety concerns, children are not allowed in the ultrasound rooms during exams. Our front office staff cannot provide observation of children in our lobby area while testing is being conducted. An adult accompanying a patient to their appointment will only be allowed in the ultrasound room at the discretion of the ultrasound technician under special circumstances. We apologize for any inconvenience.    Your cardiac CT will be scheduled at one of the below locations:   [facility] [address] [place], [place] [address] [phone] (Severe contrast allergies only)  OR   [facility] [address]  [place], [place] [address]  If scheduled at [facility], please arrive at the [facility] and Children's Entrance (Entrance C2) of [facility] 30 minutes prior to test start time. You can use the FREE valet parking offered at entrance C (encouraged to control the heart rate for the test)  Proceed to the [facility] Radiology Department (first floor) to check-in and test prep.  All radiology patients and guests should use entrance C2 at [facility], accessed from [facility],  even though the hospital's physical address listed is [address]  If scheduled at the Heart and Vascular Tower at [address], please enter the parking lot using the [address] entrance and use the FREE valet service at the patient drop-off area. Enter the building and check-in with registration on the main floor.  There is spacious parking and easy access to the radiology department from the [facility] Heart and Vascular entrance. Please enter here and check-in with the desk attendant.   If scheduled at [facility], please arrive 30 minutes early for check-in and test prep.  Please follow these instructions carefully (unless otherwise directed):  An IV will be required for this test and Nitroglycerin will be given.  Hold all erectile dysfunction medications at least 3 days (72 hrs) prior to test. (Ie viagra, cialis, sildenafil, tadalafil, etc)   On the Night Before the Test: Be sure to Drink plenty of water . Do not consume any caffeinated/decaffeinated beverages or chocolate 12 hours prior to your test. Do not take any antihistamines 12 hours prior to your test.  If the patient has contrast allergy: No allergy  On the Day of the Test: Drink plenty of water  until 1 hour prior to the test. Do not eat any food 1 hour prior to test. You may take your regular medications prior to the test.  Take Metoprolol  100 mg (Lopressor ) two hours prior to test. If you take Furosemide/Hydrochlorothiazide/Spironolactone/Chlorthalidone,and Potassium Chloride  please HOLD on the morning of the test. Patients who wear a continuous glucose monitor MUST remove the device prior to scanning. FEMALES-  please wear underwire-free bra if available, avoid dresses & tight clothing      After the Test: Drink plenty of water . After receiving IV contrast, you may experience a mild flushed feeling. This is normal. On occasion, you may experience a mild rash up to 24 hours after the test. This  is not dangerous. If this occurs, you can take Benadryl  25 mg, Zyrtec, Claritin, or Allegra and increase your fluid intake. (Patients taking Tikosyn should avoid Benadryl , and may take Zyrtec, Claritin, or Allegra) If you experience trouble breathing, this can be serious. If it is severe call 911 IMMEDIATELY. If it is mild, please call our office.  We will call to schedule your test 2-4 weeks out understanding that some insurance companies will need an authorization prior to the service being performed.   For more information and frequently asked questions, please visit our website : [email or web address]  For non-scheduling related questions, please contact the cardiac imaging nurse navigator should you have any questions/concerns: Cardiac Imaging Nurse Navigators Direct Office Dial: [phone]   For scheduling needs, including cancellations and rescheduling, please call Brittany, [phone].   Follow-Up: Your physician recommends that you schedule a follow-up appointment in: 3 months  Any Other Special Instructions Will Be Listed Below (If Applicable). Thank you for choosing [facility]!     If you need a refill on your cardiac medications before your next appointment, please call your pharmacy.

## 2024-05-15 NOTE — Progress Notes (Signed)
 Tiffany Ortiz                                          MRN: [id number]   05/15/2024   The [facility] Quality Team Specialist reviewed this patient medical record for the purposes of chart review for care gap closure. The following were reviewed: abstraction for care gap closure-kidney health evaluation for diabetes:eGFR  and uACR.    VBCI Quality Team

## 2024-05-21 ENCOUNTER — Telehealth: Payer: Self-pay

## 2024-05-21 NOTE — Telephone Encounter (Signed)
 Copied from CRM #[id number]. Topic: General - Other >> May 21, 2024  2:17 PM Montie POUR wrote: Reason for CRM:  Please call Niels at [phone] to let her know if you received a lab requisition.

## 2024-05-25 ENCOUNTER — Encounter (HOSPITAL_COMMUNITY): Payer: Self-pay

## 2024-05-26 ENCOUNTER — Other Ambulatory Visit (HOSPITAL_COMMUNITY): Payer: Self-pay | Admitting: *Deleted

## 2024-05-27 ENCOUNTER — Ambulatory Visit (HOSPITAL_COMMUNITY)
Admission: RE | Admit: 2024-05-27 | Discharge: 2024-05-27 | Disposition: A | Discharge status: Home / Self Care | Source: Ambulatory Visit | Attending: Cardiology | Admitting: Cardiology

## 2024-05-27 VITALS — BP 141/70 | HR 54

## 2024-05-27 DIAGNOSIS — I2510 Atherosclerotic heart disease of native coronary artery without angina pectoris: Secondary | ICD-10-CM | POA: Diagnosis not present

## 2024-05-27 DIAGNOSIS — R079 Chest pain, unspecified: Secondary | ICD-10-CM | POA: Insufficient documentation

## 2024-05-27 DIAGNOSIS — E1140 Type 2 diabetes mellitus with diabetic neuropathy, unspecified: Secondary | ICD-10-CM | POA: Diagnosis present

## 2024-05-27 DIAGNOSIS — R918 Other nonspecific abnormal finding of lung field: Secondary | ICD-10-CM | POA: Insufficient documentation

## 2024-05-27 LAB — CMP14+EGFR
ALT: 22 IU/L (ref 0–32)
AST: 25 IU/L (ref 0–40)
Albumin: 4.1 g/dL (ref 3.8–4.8)
Alkaline Phosphatase: 93 IU/L (ref 49–135)
BUN/Creatinine Ratio: 27 (ref 12–28)
BUN: 24 mg/dL (ref 8–27)
Bilirubin Total: 0.2 mg/dL (ref 0.0–1.2)
CO2: 23 mmol/L (ref 20–29)
Calcium: 10.1 mg/dL (ref 8.7–10.3)
Chloride: 104 mmol/L (ref 96–106)
Creatinine, Ser: 0.89 mg/dL (ref 0.57–1.00)
Globulin, Total: 2.7 g/dL (ref 1.5–4.5)
Glucose: 155 mg/dL — ABNORMAL HIGH (ref 70–99)
Potassium: 3.8 mmol/L (ref 3.5–5.2)
Sodium: 143 mmol/L (ref 134–144)
Total Protein: 6.8 g/dL (ref 6.0–8.5)
eGFR: 66 mL/min/1.73

## 2024-05-27 LAB — BASIC METABOLIC PANEL WITH GFR
BUN/Creatinine Ratio: 28 (ref 12–28)
BUN: 24 mg/dL (ref 8–27)
CO2: 23 mmol/L (ref 20–29)
Calcium: 10.1 mg/dL (ref 8.7–10.3)
Chloride: 103 mmol/L (ref 96–106)
Creatinine, Ser: 0.87 mg/dL (ref 0.57–1.00)
Glucose: 161 mg/dL — ABNORMAL HIGH (ref 70–99)
Potassium: 4 mmol/L (ref 3.5–5.2)
Sodium: 145 mmol/L — ABNORMAL HIGH (ref 134–144)
eGFR: 68 mL/min/1.73

## 2024-05-27 LAB — CBC WITH DIFFERENTIAL/PLATELET
Basophils Absolute: 0.1 x10E3/uL (ref 0.0–0.2)
Basos: 1 %
EOS (ABSOLUTE): 0.4 x10E3/uL (ref 0.0–0.4)
Eos: 4 %
Hematocrit: 43.2 % (ref 34.0–46.6)
Hemoglobin: 14.1 g/dL (ref 11.1–15.9)
Immature Grans (Abs): 0 x10E3/uL (ref 0.0–0.1)
Immature Granulocytes: 0 %
Lymphocytes Absolute: 3.2 x10E3/uL — ABNORMAL HIGH (ref 0.7–3.1)
Lymphs: 34 %
MCH: 29.1 pg (ref 26.6–33.0)
MCHC: 32.6 g/dL (ref 31.5–35.7)
MCV: 89 fL (ref 79–97)
Monocytes Absolute: 0.7 x10E3/uL (ref 0.1–0.9)
Monocytes: 7 %
Neutrophils Absolute: 5 x10E3/uL (ref 1.4–7.0)
Neutrophils: 54 %
Platelets: 359 x10E3/uL (ref 150–450)
RBC: 4.85 x10E6/uL (ref 3.77–5.28)
RDW: 12.6 % (ref 11.7–15.4)
WBC: 9.4 x10E3/uL (ref 3.4–10.8)

## 2024-05-27 LAB — HEMOGLOBIN A1C
Est. average glucose Bld gHb Est-mCnc: 166 mg/dL
Hgb A1c MFr Bld: 7.4 % — ABNORMAL HIGH (ref 4.8–5.6)

## 2024-05-27 LAB — POCT I-STAT CREATININE: Creatinine, Ser: 0.9 mg/dL (ref 0.44–1.00)

## 2024-05-27 LAB — TSH: TSH: 1.18 u[IU]/mL (ref 0.450–4.500)

## 2024-05-27 MED ORDER — NITROGLYCERIN 0.4 MG SL SUBL
0.8000 mg | SUBLINGUAL_TABLET | Freq: Once | SUBLINGUAL | Status: AC
  Administered 2024-05-27: 0.8 mg via SUBLINGUAL

## 2024-05-27 MED ORDER — IOHEXOL 350 MG/ML SOLN
100.0000 mL | Freq: Once | INTRAVENOUS | Status: AC | PRN
  Administered 2024-05-27: 100 mL via INTRAVENOUS

## 2024-05-29 ENCOUNTER — Other Ambulatory Visit: Payer: Self-pay | Admitting: Internal Medicine

## 2024-06-01 ENCOUNTER — Ambulatory Visit

## 2024-06-02 ENCOUNTER — Telehealth: Payer: Self-pay | Admitting: Internal Medicine

## 2024-06-02 NOTE — Telephone Encounter (Signed)
 Calling with CT results. Please advise

## 2024-06-02 NOTE — Telephone Encounter (Signed)
 Per Radiologist over read:  IMPRESSION: Several scattered nodules in the peripheral right lung, measuring up to 4 mm. No follow-up needed if patient is low-risk (and has no known or suspected primary neoplasm). Non-contrast chest CT can be considered in 12 months if patient is high-risk. This recommendation follows the consensus statement: Guidelines for Management of Incidental Pulmonary Nodules Detected on CT Images: From the Fleischner Society 2017; Radiology 2017; 284:228-243.     Electronically Signed   By: Norleen DELENA Kil M.D.   On: 05/31/2024 16:30  Please advise

## 2024-06-05 ENCOUNTER — Ambulatory Visit: Payer: Self-pay | Admitting: Internal Medicine

## 2024-06-12 ENCOUNTER — Ambulatory Visit: Payer: Self-pay | Admitting: Internal Medicine

## 2024-06-16 ENCOUNTER — Ambulatory Visit

## 2024-08-05 ENCOUNTER — Ambulatory Visit: Admitting: Internal Medicine

## 2024-08-20 ENCOUNTER — Ambulatory Visit: Admitting: Internal Medicine

## 2025-02-24 ENCOUNTER — Ambulatory Visit
